# Patient Record
Sex: Female | Born: 1960 | Race: White | Hispanic: No | Marital: Married | Smoking: Never smoker
Health system: Southern US, Community
[De-identification: ages and names within clinical notes are randomized; demographics above are authoritative.]

## PROBLEM LIST (undated history)

## (undated) DIAGNOSIS — Z923 Personal history of irradiation: Secondary | ICD-10-CM

## (undated) DIAGNOSIS — Z803 Family history of malignant neoplasm of breast: Secondary | ICD-10-CM

## (undated) DIAGNOSIS — T8859XA Other complications of anesthesia, initial encounter: Secondary | ICD-10-CM

## (undated) DIAGNOSIS — J302 Other seasonal allergic rhinitis: Secondary | ICD-10-CM

## (undated) DIAGNOSIS — C50919 Malignant neoplasm of unspecified site of unspecified female breast: Secondary | ICD-10-CM

## (undated) DIAGNOSIS — F32A Depression, unspecified: Secondary | ICD-10-CM

## (undated) DIAGNOSIS — J45909 Unspecified asthma, uncomplicated: Secondary | ICD-10-CM

## (undated) DIAGNOSIS — Z8042 Family history of malignant neoplasm of prostate: Secondary | ICD-10-CM

## (undated) DIAGNOSIS — R112 Nausea with vomiting, unspecified: Secondary | ICD-10-CM

## (undated) DIAGNOSIS — F419 Anxiety disorder, unspecified: Secondary | ICD-10-CM

## (undated) DIAGNOSIS — T4145XA Adverse effect of unspecified anesthetic, initial encounter: Secondary | ICD-10-CM

## (undated) DIAGNOSIS — I10 Essential (primary) hypertension: Secondary | ICD-10-CM

## (undated) DIAGNOSIS — F329 Major depressive disorder, single episode, unspecified: Secondary | ICD-10-CM

## (undated) DIAGNOSIS — Z9889 Other specified postprocedural states: Secondary | ICD-10-CM

## (undated) DIAGNOSIS — C801 Malignant (primary) neoplasm, unspecified: Secondary | ICD-10-CM

## (undated) HISTORY — DX: Essential (primary) hypertension: I10

## (undated) HISTORY — DX: Unspecified asthma, uncomplicated: J45.909

## (undated) HISTORY — DX: Family history of malignant neoplasm of prostate: Z80.42

## (undated) HISTORY — DX: Family history of malignant neoplasm of breast: Z80.3

## (undated) HISTORY — DX: Malignant neoplasm of unspecified site of unspecified female breast: C50.919

---

## 1998-03-24 ENCOUNTER — Other Ambulatory Visit: Admission: RE | Admit: 1998-03-24 | Discharge: 1998-03-24 | Payer: Self-pay | Admitting: Obstetrics and Gynecology

## 1998-10-21 ENCOUNTER — Encounter: Payer: Self-pay | Admitting: Emergency Medicine

## 1998-10-21 ENCOUNTER — Emergency Department (HOSPITAL_COMMUNITY): Admission: EM | Admit: 1998-10-21 | Discharge: 1998-10-21 | Payer: Self-pay | Admitting: Emergency Medicine

## 1998-12-25 ENCOUNTER — Other Ambulatory Visit: Admission: RE | Admit: 1998-12-25 | Discharge: 1998-12-25 | Payer: Self-pay | Admitting: Obstetrics and Gynecology

## 1999-08-10 ENCOUNTER — Other Ambulatory Visit: Admission: RE | Admit: 1999-08-10 | Discharge: 1999-08-10 | Payer: Self-pay | Admitting: Obstetrics and Gynecology

## 2000-01-18 ENCOUNTER — Other Ambulatory Visit: Admission: RE | Admit: 2000-01-18 | Discharge: 2000-01-18 | Payer: Self-pay | Admitting: Obstetrics and Gynecology

## 2001-03-23 ENCOUNTER — Other Ambulatory Visit: Admission: RE | Admit: 2001-03-23 | Discharge: 2001-03-23 | Payer: Self-pay | Admitting: Obstetrics and Gynecology

## 2002-04-05 ENCOUNTER — Other Ambulatory Visit: Admission: RE | Admit: 2002-04-05 | Discharge: 2002-04-05 | Payer: Self-pay | Admitting: Obstetrics and Gynecology

## 2003-04-16 ENCOUNTER — Other Ambulatory Visit: Admission: RE | Admit: 2003-04-16 | Discharge: 2003-04-16 | Payer: Self-pay | Admitting: Obstetrics and Gynecology

## 2005-05-07 ENCOUNTER — Other Ambulatory Visit: Admission: RE | Admit: 2005-05-07 | Discharge: 2005-05-07 | Payer: Self-pay | Admitting: Obstetrics and Gynecology

## 2017-02-28 ENCOUNTER — Encounter: Payer: Self-pay | Admitting: Women's Health

## 2017-02-28 ENCOUNTER — Ambulatory Visit (INDEPENDENT_AMBULATORY_CARE_PROVIDER_SITE_OTHER): Payer: BC Managed Care – PPO | Admitting: Women's Health

## 2017-02-28 ENCOUNTER — Telehealth: Payer: Self-pay | Admitting: *Deleted

## 2017-02-28 VITALS — BP 118/80 | Ht 66.0 in | Wt 158.0 lb

## 2017-02-28 DIAGNOSIS — N6310 Unspecified lump in the right breast, unspecified quadrant: Secondary | ICD-10-CM | POA: Diagnosis not present

## 2017-02-28 NOTE — Patient Instructions (Signed)
Colonoscopy  Dr Carlean Purl   [phone]  Fibrocystic Breast Changes Fibrocystic breast changes are changes in breast tissue that can cause breasts to become swollen, lumpy, or painful. This can happen due to buildup of scar-like tissue (fibrous tissue) or the forming of fluid-filled lumps (cysts) in the breast. This is a common condition, and it is not cancerous (is benign). The exact cause is not known, but it seems to occur when women go through hormonal changes during their menstrual cycle. Fibrocystic breast changes can affect one or both breasts. What are the causes? The exact cause of fibrocystic breast changes is not known. However, this condition:  May be related to the female hormones estrogen and progesterone.  May be influenced by family traits that get passed from parent to child (genetics). What are the signs or symptoms? Symptoms of this condition may affect one or both breasts, and may include:  Tenderness, mild discomfort, or pain.  Swelling.  Rope-like tissue that can be felt when touching the breast.  Lumps in one or both breasts.  Changes in breast size. Breasts may get larger before the menstrual period and smaller after the menstrual period.  Green or dark brown discharge from the nipple. Symptoms are usually worse before menstrual periods start, and they get better toward the end of menstrual periods. How is this diagnosed? This condition is diagnosed based on your medical history and a physical exam of your breasts. You may also have tests, such as:  A breast X-ray (mammogram).  Ultrasound of your breasts.  MRI.  Removal of a breast tissue sample for testing (breast biopsy). This may be done if your health care provider thinks that something else may be causing changes in your breasts. How is this treated? Often, treatment is not needed for this condition. In some cases, treatment may include:  Taking over-the-counter pain relievers to help lessen pain or  discomfort.  Limiting or avoiding caffeine. Foods and beverages that contain caffeine include chocolate, soda, coffee, and tea.  Reducing sugar and fat in your diet. Your health care provider may also recommend:  A procedure to remove fluid from a cyst that is causing pain (fine needle aspiration).  Surgery to remove a cyst that is large or tender or does not go away. Follow these instructions at home:  Examine your breasts after every menstrual period. If you do not have menstrual periods, check your breasts on the first day of every month. Feel for changes in your breasts, such as:  More tenderness.  A new growth.  A change in size.  A change in an existing lump.  Take over-the-counter and prescription medicines only as told by your health care provider.  Wear a well-fitted support or sports bra, especially when exercising.  Decrease or avoid caffeine, fat, and sugar in your diet as directed by your health care provider. Contact a health care provider if:  You have fluid leaking from your nipple, especially if it is bloody.  You have new lumps or bumps in your breast.  Your breast becomes enlarged, red, and painful.  You have areas of your breast that pucker inward.  Your nipple appears flat or indented. Get help right away if:  You have redness of your breast and the redness is spreading. Summary  Fibrocystic breast changes are changes in breast tissue that can cause breasts to become swollen, lumpy, or painful.  This condition may be related to the female hormones estrogen and progesterone.  With this condition, it is  important to examine your breasts after every menstrual period. If you do not have menstrual periods, check your breasts on the first day of every month. This information is not intended to replace advice given to you by your health care provider. Make sure you discuss any questions you have with your health care provider. Document Released:  07/14/2006 Document Revised: 06/08/2016 Document Reviewed: 05/26/2016 Elsevier Interactive Patient Education  2017 [employer].

## 2017-02-28 NOTE — Telephone Encounter (Signed)
-----   Message from Huel Cote, NP sent at 02/28/2017  2:20 PM EDT ----- Needs diagnostic mammogram with [place] on left breast at 2    3cm mobile slightly tender mobile.   Last mammogram at 49. PGM died from breast cancer at 80.

## 2017-02-28 NOTE — Progress Notes (Signed)
Presents as a new patient with a problem. Reports 2 day history of a left breast slightly tender mobile firm questionable cyst in the outer aspect of breast. Denies injury, change in routine. Normal only mammogram at age 56 at [facility] radiology. Paternal grandmother died of breast cancer at 39. Postmenopausal on no HRT with no bleeding for greater than 5 years. Reports normal Pap history states last Pap was done several years ago at primary care. Hypertension, depression on Wellbutrin and doing well and  having a questionable skin autoimmune issue. Has lost about 30 pounds in the past year with dietary changes and questions a gluten intolerance. Second grade teacher.  Exam: Appears well slightly worried. Examined sitting and lying position left breast 3 cm mobile slightly tender smooth cystic area at 2:00 position. No nipple discharge, skin retractions, erythema, dimpling.  2 day history of a right breast mass at 2:00 position  Plan: Diagnostic mammogram with ultrasound. Reviewed the importance of an annual mammogram. Also reviewed other health screenings. Diagnostic mammogram with ultrasound scheduled for 03/01/2017 at 2:30 PM. at [facility]. Encouraged to schedule an annual exam.

## 2017-02-28 NOTE — Telephone Encounter (Signed)
-----   Message from Huel Cote, NP sent at 02/28/2017  2:20 PM EDT ----- Needs diagnostic mammogram with [place] on left breast at 2    3cm mobile slightly tender mobile.   Last mammogram at 65. PGM died from breast cancer at 19.

## 2017-02-28 NOTE — Telephone Encounter (Signed)
Error encounter already open. °

## 2017-02-28 NOTE — Telephone Encounter (Signed)
Appointment on 03/01/17 @ 2:30pm pt informed, order faxed.

## 2017-03-01 ENCOUNTER — Other Ambulatory Visit: Payer: Self-pay | Admitting: Radiology

## 2017-03-01 NOTE — Telephone Encounter (Signed)
Haley Sanchez called from [facility] and said 2 area's in patient left breast needed bx, they needed verbal order, I okayed order for left breast bx. They will fax order for you to sign as well.

## 2017-03-01 NOTE — Telephone Encounter (Signed)
thanks

## 2017-03-03 ENCOUNTER — Telehealth: Payer: Self-pay | Admitting: *Deleted

## 2017-03-03 ENCOUNTER — Other Ambulatory Visit: Payer: Self-pay | Admitting: *Deleted

## 2017-03-03 ENCOUNTER — Encounter: Payer: Self-pay | Admitting: *Deleted

## 2017-03-03 DIAGNOSIS — Z171 Estrogen receptor negative status [ER-]: Principal | ICD-10-CM

## 2017-03-03 DIAGNOSIS — Z170 Estrogen receptor positive status [ER+]: Secondary | ICD-10-CM | POA: Insufficient documentation

## 2017-03-03 DIAGNOSIS — C50512 Malignant neoplasm of lower-outer quadrant of left female breast: Secondary | ICD-10-CM | POA: Insufficient documentation

## 2017-03-03 NOTE — Telephone Encounter (Signed)
Left vm regarding [facility] for 5.30.18. Contact information provided. 

## 2017-03-04 ENCOUNTER — Encounter: Payer: Self-pay | Admitting: Women's Health

## 2017-03-09 ENCOUNTER — Other Ambulatory Visit: Payer: Self-pay

## 2017-03-09 ENCOUNTER — Ambulatory Visit: Payer: BC Managed Care – PPO | Attending: General Surgery | Admitting: Physical Therapy

## 2017-03-09 ENCOUNTER — Ambulatory Visit
Admission: RE | Admit: 2017-03-09 | Discharge: 2017-03-09 | Disposition: A | Discharge status: Home / Self Care | Payer: BC Managed Care – PPO | Source: Ambulatory Visit | Attending: Radiation Oncology | Admitting: Radiation Oncology

## 2017-03-09 ENCOUNTER — Ambulatory Visit: Payer: Self-pay | Admitting: Hematology and Oncology

## 2017-03-09 ENCOUNTER — Other Ambulatory Visit (HOSPITAL_BASED_OUTPATIENT_CLINIC_OR_DEPARTMENT_OTHER): Payer: BC Managed Care – PPO

## 2017-03-09 ENCOUNTER — Ambulatory Visit (HOSPITAL_BASED_OUTPATIENT_CLINIC_OR_DEPARTMENT_OTHER): Payer: BC Managed Care – PPO | Admitting: Oncology

## 2017-03-09 ENCOUNTER — Encounter: Payer: Self-pay | Admitting: Physical Therapy

## 2017-03-09 ENCOUNTER — Encounter: Payer: Self-pay | Admitting: Oncology

## 2017-03-09 VITALS — BP 159/99 | HR 82 | Temp 98.7°F | Resp 18 | Ht 66.0 in | Wt 160.2 lb

## 2017-03-09 DIAGNOSIS — C50412 Malignant neoplasm of upper-outer quadrant of left female breast: Secondary | ICD-10-CM

## 2017-03-09 DIAGNOSIS — Z170 Estrogen receptor positive status [ER+]: Secondary | ICD-10-CM | POA: Insufficient documentation

## 2017-03-09 DIAGNOSIS — C50512 Malignant neoplasm of lower-outer quadrant of left female breast: Secondary | ICD-10-CM

## 2017-03-09 DIAGNOSIS — Z808 Family history of malignant neoplasm of other organs or systems: Secondary | ICD-10-CM | POA: Insufficient documentation

## 2017-03-09 DIAGNOSIS — R293 Abnormal posture: Secondary | ICD-10-CM | POA: Diagnosis present

## 2017-03-09 DIAGNOSIS — Z171 Estrogen receptor negative status [ER-]: Principal | ICD-10-CM

## 2017-03-09 DIAGNOSIS — J45909 Unspecified asthma, uncomplicated: Secondary | ICD-10-CM | POA: Insufficient documentation

## 2017-03-09 DIAGNOSIS — Z803 Family history of malignant neoplasm of breast: Secondary | ICD-10-CM | POA: Insufficient documentation

## 2017-03-09 DIAGNOSIS — I10 Essential (primary) hypertension: Secondary | ICD-10-CM | POA: Insufficient documentation

## 2017-03-09 LAB — COMPREHENSIVE METABOLIC PANEL
ALT: 15 U/L (ref 0–55)
AST: 14 U/L (ref 5–34)
Albumin: 4.1 g/dL (ref 3.5–5.0)
Alkaline Phosphatase: 77 U/L (ref 40–150)
Anion Gap: 8 mEq/L (ref 3–11)
BUN: 16.9 mg/dL (ref 7.0–26.0)
CO2: 29 mEq/L (ref 22–29)
Calcium: 9.2 mg/dL (ref 8.4–10.4)
Chloride: 106 mEq/L (ref 98–109)
Creatinine: 1 mg/dL (ref 0.6–1.1)
EGFR: 64 mL/min/{1.73_m2} — ABNORMAL LOW (ref >90)
Glucose: 73 mg/dl (ref 70–140)
Potassium: 4 mEq/L (ref 3.5–5.1)
Sodium: 144 mEq/L (ref 136–145)
Total Bilirubin: 0.44 mg/dL (ref 0.20–1.20)
Total Protein: 6.7 g/dL (ref 6.4–8.3)

## 2017-03-09 LAB — CBC WITH DIFFERENTIAL/PLATELET
BASO%: 0.5 % (ref 0.0–2.0)
Basophils Absolute: 0 10*3/uL (ref 0.0–0.1)
EOS%: 2.9 % (ref 0.0–7.0)
Eosinophils Absolute: 0.1 10*3/uL (ref 0.0–0.5)
HCT: 40.6 % (ref 34.8–46.6)
HGB: 13.6 g/dL (ref 11.6–15.9)
LYMPH%: 31.1 % (ref 14.0–49.7)
MCH: 30.3 pg (ref 25.1–34.0)
MCHC: 33.5 g/dL (ref 31.5–36.0)
MCV: 90.4 fL (ref 79.5–101.0)
MONO#: 0.3 10*3/uL (ref 0.1–0.9)
MONO%: 8.4 % (ref 0.0–14.0)
NEUT#: 2.2 10*3/uL (ref 1.5–6.5)
NEUT%: 57.1 % (ref 38.4–76.8)
Platelets: 211 10*3/uL (ref 145–400)
RBC: 4.49 10*6/uL (ref 3.70–5.45)
RDW: 12.9 % (ref 11.2–14.5)
WBC: 3.8 10*3/uL — ABNORMAL LOW (ref 3.9–10.3)
lymph#: 1.2 10*3/uL (ref 0.9–3.3)

## 2017-03-09 NOTE — Progress Notes (Signed)
Radiation Oncology         [phone] ________________________________  Initial outpatient Consultation  Name: Haley Sanchez MRN: [id number]  Date: 03/09/2017  DOB: 1961/02/03  WT:Haley Sanchez, Haley Main, MD  Haley Seltzer, MD   REFERRING PHYSICIAN: Excell Seltzer, MD  DIAGNOSIS:    ICD-9-CM ICD-10-CM   1. Malignant neoplasm of lower-outer quadrant of left breast of female, estrogen receptor positive (Haley Sanchez) 174.5 C50.512    V86.0 Z17.0   Cancer Staging Malignant neoplasm of lower-outer quadrant of left breast of female, estrogen receptor positive (Haley Sanchez) Staging form: Breast, AJCC 8th Edition - Clinical stage from 03/09/2017: Stage IB (cT2, cN0, cM0, G3, ER: Positive, PR: Positive, HER2: Positive) - Unsigned Staging comments: Staged at breast conference on 5.30.18  cT2N0M0 STAGE IB Left  Breast LOQ Invasive Ductal Carcinoma, ER+ / PR+ / Her2+, Grade 3  CHIEF COMPLAINT: Here to discuss management of left breast cancer  HISTORY OF PRESENT ILLNESS::Haley Sanchez is a 56 y.o. female who presented with a 5 day history of a palpable left breast lump. The patient's last mammogram was 18 years ago. Diagnostic bilateral mammogram on 03/01/17 showed a 1.6 cm mass with calcifications in the 2:3--3:00 position 5 cm from the nipple. US of the left breast revealed a 2.1 cm mass in the 3:00 position 5 cm from the nipple and a 0.8 cm mass in the left breast 4:00 position 1 cm from the nipple. Three normal appearing lymph nodes were noted in the left axilla.  Biopsies were performed on 03/01/17. 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 Biopsy of the left breast 4:00 position 1 cm from the nipple revealed fibrocystic changes and no evidence of malignancy.  The patient presents today in multidisciplinary breast clinic to discuss treatment options for the management of her disease.  GYN HISTORY   Menarchal: 14 LMP: 2010 Contraceptive: No HRT: No GP: G3P3  PREVIOUS RADIATION THERAPY: No  PAST MEDICAL HISTORY:  has a past medical history of Asthma and Hypertension.    PAST SURGICAL HISTORY:No past surgical history on file.  FAMILY HISTORY: family history includes Brain cancer in her maternal grandmother; Breast cancer in her paternal grandmother; Diabetes in her father; Heart disease in her mother; Hypertension in her father and mother; Kidney failure in her father.  SOCIAL HISTORY:  reports that she has never smoked. She has never used smokeless tobacco. She reports that she does not drink alcohol or use drugs. She is a second [place].  ALLERGIES: Patient has no known allergies.  MEDICATIONS:  Current Outpatient Prescriptions  Medication Sig Dispense Refill  . buPROPion (WELLBUTRIN XL) 300 MG 24 hr tablet Take 300 mg by mouth daily.    . Lisdexamfetamine Dimesylate (VYVANSE PO) Take 100 mg by mouth daily as needed.    Marland Kitchen losartan (COZAAR) 100 MG tablet Take 1 tablet by mouth daily.  0  . PARoxetine (PAXIL) 40 MG tablet Take 40 mg by mouth every morning.     No current facility-administered medications for this encounter.     REVIEW OF SYSTEMS: A 10+ POINT REVIEW OF SYSTEMS WAS OBTAINED including neurology, dermatology, psychiatry, cardiac, respiratory, lymph, extremities, GI, GU, Musculoskeletal, constitutional, breasts, reproductive, HEENT. The patient reports weight changes, fatigue, wears glasses, ringing in the ears, palpitations, left breast lumps, skin rashes, anxiety, and depression. A complete review of systems is obtained and is otherwise  negative.   PHYSICAL EXAM:  Vitals with BMI 03/09/2017  Height 5' 6"   Weight 160 lbs 3 oz  BMI 27.2  Systolic 536  Diastolic 99  Pulse 82  Respirations 18  General: Alert and oriented, in no acute distress HEENT: Head is normocephalic. Extraocular movements are intact. Oropharynx is clear. Neck: Neck is supple, no  palpable cervical or supraclavicular lymphadenopathy. Heart: Regular in rate and rhythm with no murmurs, rubs, or gallops. Chest: Clear to auscultation bilaterally, with no rhonchi, wheezes, or rales. Abdomen: Soft, nontender, nondistended, with no rigidity or guarding. Extremities: No cyanosis or edema. Lymphatics: see Neck Exam Skin: No concerning lesions. Musculoskeletal: symmetric strength and muscle tone throughout. Neurologic: Cranial nerves II through XII are grossly intact. No obvious focalities. Speech is fluent. Coordination is intact. Psychiatric: Judgment and insight are intact. Affect is appropriate. Breasts: Palpable mass at the 4:00 position of the left breast measuring about 3 cm. No other palpable masses appreciated in the right breasts or axillae.  ECOG = 0  0 - Asymptomatic (Fully active, able to carry on all predisease activities without restriction)  1 - Symptomatic but completely ambulatory (Restricted in physically strenuous activity but ambulatory and able to carry out work of a light or sedentary nature. For example, light housework, office work)  2 - Symptomatic, <50% in bed during the day (Ambulatory and capable of all self care but unable to carry out any work activities. Up and about more than 50% of waking hours)  3 - Symptomatic, >50% in bed, but not bedbound (Capable of only limited self-care, confined to bed or chair 50% or more of waking hours)  4 - Bedbound (Completely disabled. Cannot carry on any self-care. Totally confined to bed or chair)  5 - Death   Eustace Pen MM, Creech RH, Tormey DC, et al. [phone]). "Toxicity and response criteria of the [facility]". [place] Oncol. 5 (6): 649-55   LABORATORY DATA:  Lab Results  Component Value Date   WBC 3.8 (L) 03/09/2017   HGB 13.6 03/09/2017   HCT 40.6 03/09/2017   MCV 90.4 03/09/2017   PLT 211 03/09/2017   CMP     Component Value Date/Time   NA 144 03/09/2017 1254   K 4.0  03/09/2017 1254   CO2 29 03/09/2017 1254   GLUCOSE 73 03/09/2017 1254   BUN 16.9 03/09/2017 1254   CREATININE 1.0 03/09/2017 1254   CALCIUM 9.2 03/09/2017 1254   PROT 6.7 03/09/2017 1254   ALBUMIN 4.1 03/09/2017 1254   AST 14 03/09/2017 1254   ALT 15 03/09/2017 1254   ALKPHOS 77 03/09/2017 1254   BILITOT 0.44 03/09/2017 1254      RADIOGRAPHY: as above    IMPRESSION/PLAN: Left breast cancer  It was a pleasure meeting the patient today. Due to the patient's triple positive disease, chemotherapy will be given neo-adjuvantly. We discussed the risks, benefits, and side effects of radiotherapy. I recommend radiotherapy to the left breast to reduce her risk of locoregional recurrence by 2/3.  I answered her questions about the safety of radiotherapy to he satisfaction. We discussed that radiation would take approximately 4-6 weeks to complete in a post-surgical setting.  We spoke about acute effects including skin irritation and fatigue as well as much less common late effects including internal organ injury or irritation. We spoke about the latest technology that is used to minimize the risk of late effects for patients undergoing radiotherapy to the breast or chest wall. No guarantees  of treatment were given. The patient is enthusiastic about proceeding with treatment. I look forward to participating in the patient's care.  I will await her referral back to me for postoperative follow-up and eventual CT simulation/treatment planning. Prior to chemotherapy, the patient will be scheduled for a bilateral breast MRI to view the extent of disease.  __________________________________________   Eppie Gibson, MD  This document serves as a record of services personally performed by Eppie Gibson, MD. It was created on her behalf by Darcus Austin, a trained medical scribe. The creation of this record is based on the scribe's personal observations and the provider's statements to them. This document has been  checked and approved by the attending provider.

## 2017-03-09 NOTE — Progress Notes (Signed)
Nutrition Assessment  Reason for Assessment:  Pt seen in Breast Clinic  ASSESSMENT:   56 year old female with new diagnosis of breast cancer.  Past medical history reviewed  Medications:  reviewed  Labs: reviewed  Anthropometrics:   Height: 66 inches Weight: 158 lb BMI: 25.6   NUTRITION DIAGNOSIS: Food and nutrition related knowledge deficit related to new diagnosis of breast cancer as evidenced by no prior need for nutrition related information.  INTERVENTION:   Discussed and provided packet of information regarding nutritional tips for breast cancer patients.  Questions answered.  Teachback method used.  Contact information provided and patient knows to contact me with questions/concerns.    MONITORING, EVALUATION, and GOAL: Pt will consume a healthy plant based diet to maintain lean body mass throughout treatment.   Haley Sanchez B. Zenia Resides, [place], [place] Registered Dietitian [phone] (pager)

## 2017-03-09 NOTE — Therapy (Signed)
[place], [place], [address] Phone: [phone]   Fax:  [phone]  Physical Therapy Evaluation  Patient Details  Name: Haley Sanchez MRN: [id number] Date of Birth: December 02, 1960 Referring Provider: Dr. Excell Seltzer  Encounter Date: 03/09/2017      PT End of Session - 03/09/17 1616    Visit Number 1   Number of Visits 1   PT Start Time 2376   PT Stop Time 1357   PT Time Calculation (min) 22 min   Activity Tolerance Patient tolerated treatment well   Behavior During Therapy [facility] for tasks assessed/performed      Past Medical History:  Diagnosis Date  . Asthma   . Hypertension     History reviewed. No pertinent surgical history.  There were no vitals filed for this visit.       Subjective Assessment - 03/09/17 1611    Subjective Patient reports she is here to be seen by her medical team for her newly diagnosed left breast cancer.   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 It measures 2.1 cm and is located in the lower outer quadrant. She has no other medical problems.   Patient Stated Goals Reduce lymphedema risk and learn post op shoulder ROM HEP   Currently in Pain? No/denies            [facility] PT Assessment - 03/09/17 0001      Assessment   Medical Diagnosis left breast cancer   Referring Provider Dr. Excell Seltzer   Onset Date/Surgical Date 03/01/17   Hand Dominance Right   Prior Therapy none     Precautions   Precautions Other (comment)   Precaution Comments active cancer     Restrictions   Weight Bearing Restrictions No     Balance Screen   Has the patient fallen in the past 6 months No   Has the patient had a decrease in activity level because of a fear of falling?  No   Is the patient reluctant to leave their home because of a fear  of falling?  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 [employer] She does not exercise     Cognition   Overall Cognitive Status Within Functional Limits for tasks assessed     Posture/Postural Control   Posture/Postural Control Postural limitations   Postural Limitations Forward head;Rounded Shoulders     ROM / Strength   AROM / PROM / Strength AROM;Strength     AROM   AROM Assessment Site Shoulder;Cervical   Right/Left Shoulder Right;Left   Right Shoulder Extension 62 Degrees   Right Shoulder Flexion 138 Degrees   Right Shoulder ABduction 163 Degrees   Right Shoulder Internal Rotation 80 Degrees   Right Shoulder External Rotation 87 Degrees   Left Shoulder Extension 57 Degrees   Left Shoulder Flexion 140 Degrees   Left Shoulder ABduction 162 Degrees   Left Shoulder Internal Rotation 79 Degrees   Left Shoulder External Rotation 90 Degrees   Cervical Flexion WNL   Cervical Extension WNL   Cervical - Right Side Bend  WNL   Cervical - Left Side Bend WNL   Cervical - Right Rotation WNL   Cervical - Left Rotation WNL     Strength   Overall Strength Within functional limits for tasks performed           LYMPHEDEMA/ONCOLOGY QUESTIONNAIRE - 03/09/17 1615      Type   Cancer Type Left breast     Lymphedema Assessments   Lymphedema Assessments Upper extremities     Right Upper Extremity Lymphedema   10 cm Proximal to Olecranon Process 28.2 cm   Olecranon Process 25.4 cm   10 cm Proximal to Ulnar Styloid Process 19.8 cm   Just Proximal to Ulnar Styloid Process 14.3 cm   Across Hand at [employer] 17.5 cm   At [place] of 2nd Digit 5.8 cm     Left Upper Extremity Lymphedema   10 cm Proximal to Olecranon  Process 29.2 cm   Olecranon Process 24.4 cm   10 cm Proximal to Ulnar Styloid Process 20.2 cm   Just Proximal to Ulnar Styloid Process 15.2 cm   Across Hand at [employer] 18.1 cm   At [place] of 2nd Digit 5.9 cm         Objective measurements completed on examination: See above findings.        Patient was instructed today in a home exercise program today for post op shoulder range of motion. These included active assist shoulder flexion in sitting, scapular retraction, wall walking with shoulder abduction, and hands behind head external rotation.  She was encouraged to do these twice a day, holding 3 seconds and repeating 5 times when permitted by her physician.              PT Education - 03/09/17 1616    Education provided Yes   Education Details Lymphedema risk reduction and post op shoulder ROM HEP   Person(s) Educated Patient;Child(ren)   Methods Explanation;Demonstration;Handout   Comprehension Returned demonstration;Verbalized understanding              Breast Clinic Goals - 03/09/17 1621      Patient will be able to verbalize understanding of pertinent lymphedema risk reduction practices relevant to her diagnosis specifically related to skin care.   Time 1   Period Days   Status Achieved     Patient will be able to return demonstrate and/or verbalize understanding of the post-op home exercise program related to regaining shoulder range of motion.   Time 1   Period Days   Status Achieved     Patient will be able to verbalize understanding of the importance of attending the postoperative After Breast Cancer Class for further lymphedema risk reduction education and therapeutic exercise.   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 It measures 2.1 cm and is located in  the lower outer quadrant. She has no other medical problems. Her multidisciplinary medical team met prior to her assessments to determine a recommended treatment plan. She is planning to have an MRI, neoadjuvant chemotherapy, a left lumpectomy and sentinel node biopsy, and radiation followed by anti-estrogen therapy. This is what was recommended as a plan. She reports being unsure of what she is willing / going to undergo. She is  currently in school for functional medicine and reports being unsure if she is going to undergo conventional medicine for her cancer. If she undergo breast surgery as recommended, she will benefit from post op PT to regain shoulder ROM and reduce lymphedema risk.   History and Personal Factors relevant to plan of care: none   Clinical Presentation Stable   Clinical Presentation due to: Stable condition at this time   Clinical Decision Making Low   Rehab Potential Good   Clinical Impairments Affecting Rehab Potential none   PT Frequency One time visit   PT Treatment/Interventions Patient/family education;Therapeutic exercise   PT Next Visit Plan Will f/u after surgery (If she chooses to undergo surgery) to determine PT needs   PT Home Exercise Plan Post op shoulder ROM HEP   Consulted and Agree with Plan of Care Patient;Family member/caregiver   Family Member Consulted friend and 2 adult sons      Patient will benefit from skilled therapeutic intervention in order to improve the following deficits and impairments:  Postural dysfunction, Decreased knowledge of precautions, Impaired UE functional use, Decreased range of motion  Visit Diagnosis: Carcinoma of lower-outer quadrant of left breast in female, estrogen receptor positive ([place]) - Plan: PT plan of care cert/re-cert  Abnormal posture - Plan: PT plan of care cert/re-cert   Patient will follow up at outpatient cancer rehab if needed following surgery.  If the patient requires physical therapy at that time, a specific  plan will be dictated and sent to the referring physician for approval. The patient was educated today on appropriate basic range of motion exercises to begin post operatively and the importance of attending the After Breast Cancer class following surgery.  Patient was educated today on lymphedema risk reduction practices as it pertains to recommendations that will benefit the patient immediately following surgery.  She verbalized good understanding.  No additional physical therapy is indicated at this time.      Problem List Patient Active Problem List   Diagnosis Date Noted  . Malignant neoplasm of lower-outer quadrant of left breast of female, estrogen receptor positive ([place]) 03/03/2017   Annia Friendly, PT 03/09/17 4:23 PM  [facility], [place], [address] Phone: [phone]   Fax:  [phone]  Name: Haley Sanchez MRN: [id number] Date of Birth: [birth year]

## 2017-03-09 NOTE — Progress Notes (Signed)
   [place] Psychosocial Distress Screening Spiritual Care  Counselor met with patient and family members in [facility] to introduce [facility] team/resources, reviewing distress screen per protocol.  The patient scored a 9 on the Psychosocial Distress Thermometer which indicates Severe distress. Also assessed for distress and other psychosocial needs.   Counselor reviewed distress screening with patient; patient stated that the [facility] Clinic has neither improved nor worsened her distress. Due to time constraints, counselor was unable to explore the distress screening in depth, but described [facility]'s resources and asked pt's permission to call her next week to check in on how she's doing emotionally; pt agreed.  Follow up needed: Yes, counselor will call patient next week.   ONCBCN DISTRESS SCREENING 03/09/2017  Screening Type Initial Screening  Distress experienced in past week (1-10) 9  Practical problem type Work/school  Family Problem type Partner  Emotional problem type Depression;Nervousness/Anxiety  Information Concerns Type Lack of info about diagnosis;Lack of info about treatment;Lack of info about complementary therapy choices  Physical Problem type Skin dry/itchy  Referral to support programs Yes    [place], [place] [facility] [place]

## 2017-03-09 NOTE — Patient Instructions (Signed)

## 2017-03-09 NOTE — Progress Notes (Signed)
DISCONTINUE ON PATHWAY REGIMEN - Breast   Docetaxel  + Carboplatin + Trastuzumab + Pertuzumab (TCHP) q21 Days:   A cycle is every 21 days:     Pertuzumab      Pertuzumab      Trastuzumab      Trastuzumab      Carboplatin      Docetaxel   **Always confirm dose/schedule in your pharmacy ordering system**    Trastuzumab (Maintenance - NO Loading Dose):   A cycle is every 21 days:     Trastuzumab   **Always confirm dose/schedule in your pharmacy ordering system**    REASON: Other Reason PRIOR TREATMENT: [id number]: Docetaxel + Carboplatin + Trastuzumab + Pertuzumab (TCHP) q21 Days x 6 Cycles, then Continue Trastuzumab Maintenance Post-Surgery q21 Days x 11 Cycles to Complete One Year of Therapy TREATMENT RESPONSE: Unable to Evaluate  START ON PATHWAY REGIMEN - Breast   Docetaxel  + Carboplatin + Trastuzumab + Pertuzumab (TCHP) q21 Days:   A cycle is every 21 days:     Pertuzumab      Pertuzumab      Trastuzumab      Trastuzumab      Carboplatin      Docetaxel   **Always confirm dose/schedule in your pharmacy ordering system**    Trastuzumab (Maintenance - NO Loading Dose):   A cycle is every 21 days:     Trastuzumab   **Always confirm dose/schedule in your pharmacy ordering system**    Patient Characteristics: Preoperative or Nonsurgical Candidate (Clinical Staging), Neoadjuvant Therapy followed by Surgery, Invasive Disease, Chemotherapy, HER2 Positive, ER Positive Therapeutic Status: Preoperative or Nonsurgical Candidate (Clinical Staging) AJCC M Category: cM0 AJCC Grade: G3 Breast Surgical Plan: Neoadjuvant Therapy followed by Surgery ER Status: Positive (+) AJCC 8 Stage Grouping: IB HER2 Status: Positive (+) AJCC T Category: cT2 AJCC N Category: cN0 PR Status: Positive (+)  Intent of Therapy: Curative Intent, Discussed with Patient

## 2017-03-09 NOTE — Progress Notes (Signed)
START ON PATHWAY REGIMEN - Breast   Docetaxel  + Carboplatin + Trastuzumab + Pertuzumab (TCHP) q21 Days:   A cycle is every 21 days:     Pertuzumab      Pertuzumab      Trastuzumab      Trastuzumab      Carboplatin      Docetaxel   **Always confirm dose/schedule in your pharmacy ordering system**    Trastuzumab (Maintenance - NO Loading Dose):   A cycle is every 21 days:     Trastuzumab   **Always confirm dose/schedule in your pharmacy ordering system**    Patient Characteristics: Preoperative or Nonsurgical Candidate (Clinical Staging), Neoadjuvant Therapy followed by Surgery, Invasive Disease, Chemotherapy, HER2 Positive, ER Positive Therapeutic Status: Preoperative or Nonsurgical Candidate (Clinical Staging) AJCC M Category: cM0 AJCC Grade: G3 Breast Surgical Plan: Neoadjuvant Therapy followed by Surgery ER Status: Positive (+) AJCC 8 Stage Grouping: IB HER2 Status: Positive (+) AJCC T Category: cT2 AJCC N Category: cN0 PR Status: Positive (+)  Intent of Therapy: Curative Intent, Discussed with Patient

## 2017-03-09 NOTE — Progress Notes (Signed)
[facility]  [phone] [phone]     ID: Haley Sanchez DOB: Feb 17, 1961  MR#: [id number]  TZG#:[id number]  Patient Care Team: Orpah Melter, MD as PCP - General (Family Medicine) Excell Seltzer, MD as Consulting Physician (General Surgery) Magrinat, Virgie Dad, MD as Consulting Physician (Oncology) Eppie Gibson, MD as Attending Physician (Radiation Oncology) Chauncey Cruel, MD OTHER MD:  CHIEF COMPLAINT: Triple positive breast cancer  CURRENT TREATMENT: Neoadjuvant chemotherapy immunotherapy   BREAST CANCER HISTORY: Haley Sanchez herself noted a lump in her left breast sometime before bringing it to medical attention. Note that the patient had not had a mammogram in 18 years.  On 03/01/2017 she was set up for bilateral diagnostic mammography with tomography and left breast ultrasonography by her gynecologist, Elon Alas. The breast density was category B. In the left breast at the 2:30 o'clock axis 5 cm from the nipple there was a 1.6 cm high density mass with spiculated margins and margins by ultrasound there was a 2.1 cm irregular solid mass at this location. There was also a 0.8 cm oval mass in the left breast at 4:00 1 cm from the nipple. There were 3 normal-appearing lymph nodes in the left axilla.  On 03/01/2017 the patient underwent biopsy of the 2 areas in question. The smaller lesion at 4:00 showed only fibrocystic changes with no evidence of malignancy. The lesion at 2:30 o'clock however showed invasive ductal carcinoma, grade 3, estrogen receptor 100% positive, progesterone receptor 80% positive, both with strong staining intensity, with an MIB-1 of 25%, and HER-2 positivity, the signals ratio being 1.88 but the number per cell being 6.20.  Her subsequent history is as detailed below  INTERVAL HISTORY: [name] was evaluated in the multidisciplinary breast cancer clinic 03/09/2017 accompanied by her sons Haley Sanchez and Haley Sanchez and by her friend  Haley Sanchez. Her case was also presented in the multidisciplinary breast cancer conference that same morning. At that time a preliminary plan was proposed: Breast MRI, neoadjuvant treatment followed by breast conserving surgery radiation and anti-estrogens  REVIEW OF SYSTEMS:  Aside from the mass itself, there were no specific symptoms leading to the original mammogram, which was routinely scheduled. The patient denies unusual headaches, visual changes, nausea, vomiting, stiff neck, dizziness, or gait imbalance. There has been no cough, phlegm production, or pleurisy, no chest pain or pressure, and no change in bowel or bladder habits. The patient denies fever, rash, bleeding, unexplained fatigue or unexplained weight loss. The patient does admit to anxiety and depression, a skin rash, and a history of palpitations. A detailed review of systems was otherwise entirely negative.   PAST MEDICAL HISTORY: Past Medical History:  Diagnosis Date  . Asthma   . Hypertension     PAST SURGICAL HISTORY: History reviewed. No pertinent surgical history.  FAMILY HISTORY Family History  Problem Relation Age of Onset  . Heart disease Mother   . Hypertension Mother   . Kidney failure Father   . Diabetes Father   . Hypertension Father   . Brain cancer Maternal Grandmother   . Breast cancer Paternal Grandmother   The patient's father died from renal failure at the age of 18. The patient tells me he refused hemodialysis and was treated with diet changes and lives 3 years. The patient's mother is living at age 50, as of May 2018. The patient has one brother, 2 sisters. A paternal grandmother was diagnosed with breast cancer at age 35. A maternal grandmother was diagnosed with brain cancer at some  point. There is no history of ovarian cancer in the family  GYNECOLOGIC HISTORY:  No LMP recorded. Patient is not currently having periods (Reason: Other). Menarche age 37, first live birth age 44. The patient is GX P3. She  stopped having periods approximately 2010 and did not take hormone replacement. 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 Her husband Darrel Danelle Earthly works for before [employer] as a Government social research officer. Son Haley Sanchez, 36, lives in [place] and works for [employer]. Son Haley Sanchez, [place], lives in [place] and works for a needle. Son Haley Sanchez 50 lives in [place] where he teaches at [employer] ([place] studies). The patient has 5 grandchildren. She is a Tourist information centre manager    ADVANCED DIRECTIVES: Not in place   HEALTH MAINTENANCE: Social History  Substance Use Topics  . Smoking status: Never Smoker  . Smokeless tobacco: Never Used  . Alcohol use No     Colonoscopy:[facility], more than 10 years before 2018  PAP:  Bone density: Never   No Known Allergies  Current Outpatient Prescriptions  Medication Sig Dispense Refill  . buPROPion (WELLBUTRIN XL) 300 MG 24 hr tablet Take 300 mg by mouth daily.    . Lisdexamfetamine Dimesylate (VYVANSE PO) Take 100 mg by mouth daily as needed.    Marland Kitchen losartan (COZAAR) 100 MG tablet Take 1 tablet by mouth daily.  0  . PARoxetine (PAXIL) 40 MG tablet Take 40 mg by mouth every morning.     No current facility-administered medications for this visit.     OBJECTIVE: Middle-aged white woman in no acute distress Vitals:   03/09/17 1327  BP: (!) 159/99  Pulse: 82  Resp: 18  Temp: 98.7 F (37.1 C)     Body mass index is 25.86 kg/m.    ECOG FS:0 - Asymptomatic  Ocular: Sclerae unicteric, pupils equal, round and reactive to light Ear-nose-throat: Oropharynx clear and moist Lymphatic: No cervical or supraclavicular adenopathy Lungs no rales or rhonchi, good excursion bilaterally Heart regular rate and rhythm, no murmur appreciated Abd soft, nontender, positive bowel sounds MSK no focal spinal tenderness, no joint  edema Neuro: non-focal, well-oriented, appropriate affect Breasts: The right breast is benign. In the left breast laterally there is a movable mass that measures a little under an inch, not associated with erythema or other skin changes or with nipple retraction. Both axillae are benign.   LAB RESULTS:  CMP     Component Value Date/Time   NA 144 03/09/2017 1254   K 4.0 03/09/2017 1254   CO2 29 03/09/2017 1254   GLUCOSE 73 03/09/2017 1254   BUN 16.9 03/09/2017 1254   CREATININE 1.0 03/09/2017 1254   CALCIUM 9.2 03/09/2017 1254   PROT 6.7 03/09/2017 1254   ALBUMIN 4.1 03/09/2017 1254   AST 14 03/09/2017 1254   ALT 15 03/09/2017 1254   ALKPHOS 77 03/09/2017 1254   BILITOT 0.44 03/09/2017 1254    No results found for: TOTALPROTELP, ALBUMINELP, A1GS, A2GS, BETS, BETA2SER, GAMS, MSPIKE, SPEI  No results found for: KPAFRELGTCHN, LAMBDASER, KAPLAMBRATIO  Lab Results  Component Value Date   WBC 3.8 (L) 03/09/2017   NEUTROABS 2.2 03/09/2017   HGB 13.6 03/09/2017   HCT 40.6 03/09/2017   MCV 90.4 03/09/2017   PLT 211 03/09/2017      Chemistry      Component Value Date/Time   NA 144 03/09/2017 1254   K  4.0 03/09/2017 1254   CO2 29 03/09/2017 1254   BUN 16.9 03/09/2017 1254   CREATININE 1.0 03/09/2017 1254      Component Value Date/Time   CALCIUM 9.2 03/09/2017 1254   ALKPHOS 77 03/09/2017 1254   AST 14 03/09/2017 1254   ALT 15 03/09/2017 1254   BILITOT 0.44 03/09/2017 1254       No results found for: LABCA2  No components found for: QBVQXI503  No results for input(s): INR in the last 168 hours.  Urinalysis No results found for: COLORURINE, APPEARANCEUR, LABSPEC, PHURINE, GLUCOSEU, HGBUR, BILIRUBINUR, KETONESUR, PROTEINUR, UROBILINOGEN, NITRITE, LEUKOCYTESUR   STUDIES: Outside studies reviewed with patient  ELIGIBLE FOR AVAILABLE RESEARCH PROTOCOL: no  ASSESSMENT: 56 y.o.  [place] woman status post left breast lower outer quadrant biopsy 03/01/2017 for  a clinical T2 N0, stage IB invasive ductal carcinoma, grade 3, triple positive, with an MIB-1 of 25%  (1) neoadjuvant chemotherapy to consist of paclitaxel weekly 12 together with trastuzumab every 21 days  (2) trastuzumab to be continued to complete one year  (3) definitive surgery to follow chemotherapy  (4) adjuvant radiation to follow surgery  (5) anti-estrogens to follow the completion of local treatment   PLAN: We spent the better part of today's hour-long appointment discussing the biology of breast cancer in general, and the specifics of the patient's tumor in particular.We first reviewed the fact that cancer is not one disease but more than 100 different diseases and that it is important to keep them separate-- otherwise when friends and relatives discuss their own cancer experiences with [place] confusion can result. Similarly we explained that if breast cancer spreads to the bone or liver, the patient would not have bone cancer or liver cancer, but breast cancer in the bone and breast cancer in the liver: one cancer in three places-- not 3 different cancers which otherwise would have to be treated in 3 different ways.  We discussed the difference between local and systemic therapy. In terms of loco-regional treatment, lumpectomy plus radiation is equivalent to mastectomy as far as survival is concerned. For this reason, and because the cosmetic results are generally superior, we generally recommend breast conserving surgery. We also noted that in terms of sequencing of treatments, whether systemic therapy or surgery is done first does not affect the ultimate outcome.  We then discussed the rationale for systemic therapy. There is some risk that this cancer may have already spread to other parts of her body. Patients frequently ask at this point about bone scans, CAT scans and PET scans to find out if they have occult breast cancer somewhere else. The problem is that in early stage disease  we are much more likely to find false positives then true cancers and this would expose the patient to unnecessary procedures as well as unnecessary radiation. Scans cannot answer the question the patient really would like to know, which is whether she has microscopic disease elsewhere in her body. For those reasons we do not recommend them.  Of course we would proceed to aggressive evaluation of any symptoms that might suggest metastatic disease, but that is not the case here.  Next we went over the options for systemic therapy which are anti-estrogens, anti-HER-2 immunotherapy, and chemotherapy. Teshara is a good candidate for all 3 and we explained that if her risk of recurrence after optimal local treatment is X, chemotherapy will lower that by one third, to two thirds of X, anti-estrogen therapy will cut it in half  to one third of X and anti-HER-2 treatment will cut in half again to 1/6 of X. This means that her risk of systemic recurrence after optimal local treatment is 42% for example it would decrease to 7% with standard treatment, which is what we propose  More specifically this would consist of paclitaxel weekly 12 given together with trastuzumab every 3 weeks. The trastuzumab would be continued to complete one year. This would be followed by surgery and radiation, then anti-estrogens for 5 years.  We discussed the possible toxicities, side effects and complications of the various agents. Cosima understands she will need a port and an echocardiogram and also will benefit from come to chemotherapy school. She will need a baseline MRI as well.  Desirae has a good understanding of the overall plan. She expressed some hesitation regarding it in terms of timing (there is a grandchild on the way). She knows the goal of treatment in her case is cure. She will call with any problems that may develop before her next visit here.  Chauncey Cruel, MD   03/09/2017 9:55 PM Medical Oncology and  Hematology [facility] [address] [place], [place] [address] Tel. [phone]    Fax. [phone]

## 2017-03-10 ENCOUNTER — Encounter: Payer: Self-pay | Admitting: *Deleted

## 2017-03-10 ENCOUNTER — Telehealth: Payer: Self-pay | Admitting: *Deleted

## 2017-03-10 NOTE — Telephone Encounter (Signed)
Spoke to Haley Sanchez regarding [place] from 5.30.18. Denies questions or concerns regarding dx. Haley Sanchez has decided to move forward with treatment plan. Haley Sanchez request to see Dr. Lindi Adie and Dr. Donne Hazel. Haley Sanchez scheduled to see Dr. Lindi Adie on 6/4 at 3:45. Informed Haley Sanchez that we will work with Dr. Cristal Generous office to get an appt to see her. Received verbal understanding. Encourage Haley Sanchez to call with further questions or needs. Contact information provided.

## 2017-03-12 ENCOUNTER — Ambulatory Visit (HOSPITAL_COMMUNITY)
Admission: RE | Admit: 2017-03-12 | Discharge: 2017-03-12 | Disposition: A | Discharge status: Home / Self Care | Payer: BC Managed Care – PPO | Source: Ambulatory Visit | Attending: Oncology | Admitting: Oncology

## 2017-03-12 DIAGNOSIS — C50512 Malignant neoplasm of lower-outer quadrant of left female breast: Secondary | ICD-10-CM | POA: Diagnosis not present

## 2017-03-12 DIAGNOSIS — Z170 Estrogen receptor positive status [ER+]: Secondary | ICD-10-CM | POA: Diagnosis not present

## 2017-03-12 MED ORDER — GADOBENATE DIMEGLUMINE 529 MG/ML IV SOLN
15.0000 mL | Freq: Once | INTRAVENOUS | Status: AC | PRN
  Administered 2017-03-12: 15 mL via INTRAVENOUS

## 2017-03-14 ENCOUNTER — Ambulatory Visit (HOSPITAL_BASED_OUTPATIENT_CLINIC_OR_DEPARTMENT_OTHER): Payer: BC Managed Care – PPO | Admitting: Hematology and Oncology

## 2017-03-14 ENCOUNTER — Encounter: Payer: Self-pay | Admitting: Hematology and Oncology

## 2017-03-14 ENCOUNTER — Encounter: Payer: Self-pay | Admitting: *Deleted

## 2017-03-14 VITALS — BP 151/89 | HR 83 | Temp 98.4°F | Resp 20 | Ht 66.0 in | Wt 156.6 lb

## 2017-03-14 DIAGNOSIS — C50512 Malignant neoplasm of lower-outer quadrant of left female breast: Secondary | ICD-10-CM

## 2017-03-14 DIAGNOSIS — Z170 Estrogen receptor positive status [ER+]: Secondary | ICD-10-CM

## 2017-03-14 MED ORDER — LIDOCAINE-PRILOCAINE 2.5-2.5 % EX CREA: TOPICAL_CREAM | CUTANEOUS | 3 refills | Status: DC

## 2017-03-14 MED ORDER — ONDANSETRON HCL 8 MG PO TABS: 8.0000 mg | ORAL_TABLET | Freq: Two times a day (BID) | ORAL | 1 refills | Status: DC | PRN

## 2017-03-14 MED ORDER — PROCHLORPERAZINE MALEATE 10 MG PO TABS: 10.0000 mg | ORAL_TABLET | Freq: Four times a day (QID) | ORAL | 1 refills | Status: DC | PRN

## 2017-03-14 NOTE — Progress Notes (Signed)
Patient came in to meet me responding to my voicemail. Introduced myself as her Arboriculturist. Asked patient if she has met her OOP for the year and she states she does not know what it is but is sure she has not. Advised patient of the copay assistance program through [place] for Herceptin and asked if she would like to apply. Patient states yes. Completed the enrollment online. Patient approved for $25,000 03/14/17-03/13/18 only leaving her with a $5 copay for Herceptin. Advised patient I will receive an approval letter via fax and email to billing and that she will receive a copy in the mail. There is nothing she needs to do with her copy a it is only for her records.  Asked patient about household size and income. Per verbal, patient is approved for the one-time $1000 [employer]. Patient has a copy of the approval letter as well as the expense sheet and our outpatient pharmacy information. Explained to patient how grant works and what it covers. Patient has my card for any additional financial questions or concerns.   Emailed copy of approval letter with copay card information to [facility] billing team. Copy placed in my copay book.

## 2017-03-14 NOTE — Progress Notes (Signed)
Called patient to introduce myself and to offer [place] and [employer]. Left voicemail and placed a note in for patient to see me this afternoon to discuss after her visit with doctor.

## 2017-03-14 NOTE — Progress Notes (Signed)
Patient Care Team: Orpah Melter, MD as PCP - General (Family Medicine) Excell Seltzer, MD as Consulting Physician (General Surgery) Magrinat, Virgie Dad, MD as Consulting Physician (Oncology) Eppie Gibson, MD as Attending Physician (Radiation Oncology) Huel Cote, NP as Nurse Practitioner (Obstetrics and Gynecology)  DIAGNOSIS:  Encounter Diagnosis  Name Primary?  . Malignant neoplasm of lower-outer quadrant of left breast of female, estrogen receptor positive ([place])     SUMMARY OF ONCOLOGIC HISTORY:   Malignant neoplasm of lower-outer quadrant of left breast of female, estrogen receptor positive ([place])   03/01/2017 Initial Diagnosis    Left breast 2:30 position: 2.1 cm irregular solid mass, additional 0.8 cm oval mass in the left breast (fibrocystic); primary mass on biopsy revealed IDC grade 3, ER 100%, PR 80%, Ki-67 25%, HER-2 positive ratio 1.88, copy #6.2, T2 N0 stage II a clinical stage       CHIEF COMPLIANT: Patient wishes to transfer care to me  INTERVAL HISTORY: Haley Sanchez is a 56 year old with above-mentioned history of left breast cancer who after much trepidation had undergone biopsy for the left breast lump which came back as 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 She was presented at the multidisciplinary tumor board and the recommendation was to neoadjuvant chemotherapy followed by lumpectomy followed by radiation and antiestrogen therapy. She was presented this treatment plan and she appears to have [facility] board with the plan. However she tells me that many of her friends were treated by me and would like to switch providers at this time before she starts her treatment. She absolutely had no issues and felt very comfortable with Dr. Jana Hakim as well. She is accompanied today by her friend.  REVIEW OF SYSTEMS:   Constitutional: Denies fevers, chills or abnormal weight loss Eyes: Denies blurriness of vision Ears, nose, mouth, throat, and  face: Denies mucositis or sore throat Respiratory: Denies cough, dyspnea or wheezes Cardiovascular: Denies palpitation, chest discomfort Gastrointestinal:  Denies nausea, heartburn or change in bowel habits Skin: Denies abnormal skin rashes Lymphatics: Denies new lymphadenopathy or easy bruising Neurological:Denies numbness, tingling or new weaknesses Behavioral/Psych: Very anxious and nervous  Extremities: No lower extremity edema Breast:  denies any pain or lumps or nodules in either breasts All other systems were reviewed with the patient and are negative.  I have reviewed the past medical history, past surgical history, social history and family history with the patient and they are unchanged from previous note.  ALLERGIES:  has No Known Allergies.  MEDICATIONS:  Current Outpatient Prescriptions  Medication Sig Dispense Refill  . buPROPion (WELLBUTRIN XL) 300 MG 24 hr tablet Take 300 mg by mouth daily.    . Lisdexamfetamine Dimesylate (VYVANSE PO) Take 100 mg by mouth daily as needed.    Marland Kitchen losartan (COZAAR) 100 MG tablet Take 1 tablet by mouth daily.  0  . PARoxetine (PAXIL) 40 MG tablet Take 40 mg by mouth every morning.     No current facility-administered medications for this visit.     PHYSICAL EXAMINATION: ECOG PERFORMANCE STATUS: 1 - Symptomatic but completely ambulatory  Vitals:   03/14/17 1616  BP: (!) 151/89  Pulse: 83  Resp: 20  Temp: 98.4 F (36.9 C)   Filed Weights   03/14/17 1616  Weight: 156 lb 9.6 oz (71 kg)    GENERAL:alert, no distress and comfortable SKIN: skin color, texture, turgor are normal, no rashes or significant lesions EYES: normal, Conjunctiva are pink and non-injected, sclera clear OROPHARYNX:no exudate, no  erythema and lips, buccal mucosa, and tongue normal  NECK: supple, thyroid normal size, non-tender, without nodularity LYMPH:  no palpable lymphadenopathy in the cervical, axillary or inguinal LUNGS: clear to auscultation and  percussion with normal breathing effort HEART: regular rate & rhythm and no murmurs and no lower extremity edema ABDOMEN:abdomen soft, non-tender and normal bowel sounds MUSCULOSKELETAL:no cyanosis of digits and no clubbing  NEURO: alert & oriented x 3 with fluent speech, no focal motor/sensory deficits EXTREMITIES: No lower extremity edema  LABORATORY DATA:  I have reviewed the data as listed   Chemistry      Component Value Date/Time   NA 144 03/09/2017 1254   K 4.0 03/09/2017 1254   CO2 29 03/09/2017 1254   BUN 16.9 03/09/2017 1254   CREATININE 1.0 03/09/2017 1254      Component Value Date/Time   CALCIUM 9.2 03/09/2017 1254   ALKPHOS 77 03/09/2017 1254   AST 14 03/09/2017 1254   ALT 15 03/09/2017 1254   BILITOT 0.44 03/09/2017 1254       Lab Results  Component Value Date   WBC 3.8 (L) 03/09/2017   HGB 13.6 03/09/2017   HCT 40.6 03/09/2017   MCV 90.4 03/09/2017   PLT 211 03/09/2017   NEUTROABS 2.2 03/09/2017    ASSESSMENT & PLAN:  Malignant neoplasm of lower-outer quadrant of left breast of female, estrogen receptor positive ([facility]) Left breast 2:30 position: 2.1 cm irregular solid mass, additional 0.8 cm oval mass in the left breast (fibrocystic); primary mass on biopsy revealed IDC grade 3, ER 100%, PR 80%, Ki-67 25%, HER-2 positive ratio 1.88, copy #6.2, T2 N0 stage II a clinical stage  Pathology and radiology counseling: Discussed with the patient, the details of pathology including the type of breast cancer,the clinical staging, the significance of ER, PR and HER-2/neu receptors and the implications for treatment. After reviewing the pathology in detail, we proceeded to discuss the different treatment options between surgery, radiation, chemotherapy, antiestrogen therapies.  Recommendation: 1. Neoadjuvant Taxol Herceptin weekly 12 followed by Herceptin maintenance for 6 months 2. breast conserving surgery with sentinel lymph node biopsy 3. Adjuvant radiation  therapy 4. Followed by adjuvant antiestrogen therapy  Chemotherapy counseling: I discussed this and benefits of Taxol chemotherapy including the risk of neuropathy, cytopenias, risk of infection, fatigue, anemia, loss of taste, hair loss, nausea, liver function abnormalities etc. I also explained the reversible cardiomyopathy risk with Herceptin. Patient understands the risks and benefits and consented to proceeding with treatment.  Plan: 1. Echocardiogram 2. Chemotherapy class 3. Port placement    I spent 60 minutes talking to the patient of which more than half was spent in counseling and coordination of care.  No orders of the defined types were placed in this encounter.  The patient has a good understanding of the overall plan. she agrees with it. she will call with any problems that may develop before the next visit here.   Rulon Eisenmenger, MD 03/14/17

## 2017-03-14 NOTE — Assessment & Plan Note (Signed)
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 After reviewing the pathology in detail, we proceeded to discuss the different treatment options between surgery, radiation, chemotherapy, antiestrogen therapies.  Recommendation: 1. Neoadjuvant Taxol Herceptin weekly 12 followed by Herceptin maintenance for 6 months 2. breast conserving surgery with sentinel lymph node biopsy 3. Adjuvant radiation therapy 4. Followed by adjuvant antiestrogen therapy  Chemotherapy counseling: I discussed this and benefits of Taxol chemotherapy including the risk of neuropathy, cytopenias, risk of infection, fatigue, anemia, loss of taste, hair loss, nausea, liver function abnormalities etc. I also explained the reversible cardiomyopathy risk with Herceptin. Patient understands the risks and benefits and consented to proceeding with treatment.  Plan: 1. Echocardiogram 2. Chemotherapy class 3. Port placement

## 2017-03-15 ENCOUNTER — Encounter: Payer: Self-pay | Admitting: *Deleted

## 2017-03-15 ENCOUNTER — Other Ambulatory Visit: Payer: Self-pay | Admitting: General Surgery

## 2017-03-15 ENCOUNTER — Encounter (HOSPITAL_BASED_OUTPATIENT_CLINIC_OR_DEPARTMENT_OTHER)
Admission: RE | Admit: 2017-03-15 | Discharge: 2017-03-15 | Disposition: A | Discharge status: Home / Self Care | Payer: BC Managed Care – PPO | Source: Ambulatory Visit | Attending: General Surgery | Admitting: General Surgery

## 2017-03-15 ENCOUNTER — Other Ambulatory Visit: Payer: BC Managed Care – PPO

## 2017-03-15 ENCOUNTER — Encounter (HOSPITAL_BASED_OUTPATIENT_CLINIC_OR_DEPARTMENT_OTHER): Payer: Self-pay | Admitting: *Deleted

## 2017-03-15 ENCOUNTER — Telehealth: Payer: Self-pay | Admitting: Women's Health

## 2017-03-15 DIAGNOSIS — Z01810 Encounter for preprocedural cardiovascular examination: Secondary | ICD-10-CM | POA: Diagnosis present

## 2017-03-15 DIAGNOSIS — C50512 Malignant neoplasm of lower-outer quadrant of left female breast: Secondary | ICD-10-CM | POA: Diagnosis not present

## 2017-03-15 DIAGNOSIS — Z808 Family history of malignant neoplasm of other organs or systems: Secondary | ICD-10-CM | POA: Diagnosis not present

## 2017-03-15 DIAGNOSIS — J45909 Unspecified asthma, uncomplicated: Secondary | ICD-10-CM | POA: Diagnosis not present

## 2017-03-15 DIAGNOSIS — I10 Essential (primary) hypertension: Secondary | ICD-10-CM | POA: Diagnosis not present

## 2017-03-15 DIAGNOSIS — Z170 Estrogen receptor positive status [ER+]: Secondary | ICD-10-CM | POA: Diagnosis not present

## 2017-03-15 DIAGNOSIS — Z803 Family history of malignant neoplasm of breast: Secondary | ICD-10-CM | POA: Diagnosis not present

## 2017-03-15 NOTE — Progress Notes (Signed)
[place] Counseling Note  Called pt to f/u on patient's emotional wellbeing after meeting at [facility] on 5/30. LVM with patient, and will call back tomorrow 6/6.  Westly Pam, [place] LPCA [place]

## 2017-03-15 NOTE — Telephone Encounter (Signed)
Message left for follow-up, instructed to call if any questions

## 2017-03-15 NOTE — Progress Notes (Signed)
Pt walked in from appt with Dr Donne Hazel for pre op screening. PAT appt done also. Boost breeze given to pt and instructions given.

## 2017-03-16 NOTE — Progress Notes (Signed)
[place] Counseling Note  Called patient to f/u on patient's emotional wellbeing following our meeting at [facility] on 5/31. LVM with patient, letting patient know that counselor is available to provide any emotional support needed or help answer any questions.  Westly Pam, [place] LPCA [place]

## 2017-03-16 NOTE — Progress Notes (Signed)
EKG reviewed by Dr. Lissa Hoard. No further testing needed at this time.

## 2017-03-18 ENCOUNTER — Ambulatory Visit (HOSPITAL_COMMUNITY)
Admission: RE | Admit: 2017-03-18 | Discharge: 2017-03-18 | Disposition: A | Discharge status: Home / Self Care | Payer: BC Managed Care – PPO | Source: Ambulatory Visit | Attending: Oncology | Admitting: Oncology

## 2017-03-18 DIAGNOSIS — Z01810 Encounter for preprocedural cardiovascular examination: Secondary | ICD-10-CM | POA: Insufficient documentation

## 2017-03-18 DIAGNOSIS — C50512 Malignant neoplasm of lower-outer quadrant of left female breast: Secondary | ICD-10-CM | POA: Insufficient documentation

## 2017-03-18 DIAGNOSIS — Z170 Estrogen receptor positive status [ER+]: Secondary | ICD-10-CM | POA: Insufficient documentation

## 2017-03-18 NOTE — Progress Notes (Signed)
  Echocardiogram 2D Echocardiogram has been performed.  Haley Sanchez 03/18/2017, 12:09 PM

## 2017-03-22 ENCOUNTER — Other Ambulatory Visit: Payer: BC Managed Care – PPO

## 2017-03-24 ENCOUNTER — Encounter (HOSPITAL_BASED_OUTPATIENT_CLINIC_OR_DEPARTMENT_OTHER): Payer: Self-pay | Admitting: Anesthesiology

## 2017-03-24 ENCOUNTER — Encounter (HOSPITAL_BASED_OUTPATIENT_CLINIC_OR_DEPARTMENT_OTHER)
Admission: RE | Disposition: A | Discharge status: Home / Self Care | Payer: Self-pay | Source: Ambulatory Visit | Attending: General Surgery

## 2017-03-24 ENCOUNTER — Ambulatory Visit (HOSPITAL_BASED_OUTPATIENT_CLINIC_OR_DEPARTMENT_OTHER)
Admission: RE | Admit: 2017-03-24 | Discharge: 2017-03-24 | Disposition: A | Discharge status: Home / Self Care | Payer: BC Managed Care – PPO | Source: Ambulatory Visit | Attending: General Surgery | Admitting: General Surgery

## 2017-03-24 DIAGNOSIS — Z01818 Encounter for other preprocedural examination: Secondary | ICD-10-CM | POA: Diagnosis not present

## 2017-03-24 DIAGNOSIS — Z538 Procedure and treatment not carried out for other reasons: Secondary | ICD-10-CM | POA: Diagnosis not present

## 2017-03-24 HISTORY — DX: Major depressive disorder, single episode, unspecified: F32.9

## 2017-03-24 HISTORY — DX: Depression, unspecified: F32.A

## 2017-03-24 HISTORY — DX: Anxiety disorder, unspecified: F41.9

## 2017-03-24 SURGERY — INSERTION, TUNNELED CENTRAL VENOUS DEVICE, WITH PORT
Anesthesia: General

## 2017-03-24 MED ORDER — CHLORHEXIDINE GLUCONATE CLOTH 2 % EX PADS: 6.0000 | MEDICATED_PAD | Freq: Once | CUTANEOUS | Status: DC

## 2017-03-24 MED ORDER — MIDAZOLAM HCL 2 MG/2ML IJ SOLN: 1.0000 mg | INTRAMUSCULAR | Status: DC | PRN

## 2017-03-24 MED ORDER — PROPOFOL 10 MG/ML IV BOLUS
INTRAVENOUS | Status: AC
  Filled 2017-03-24: qty 20

## 2017-03-24 MED ORDER — GABAPENTIN 300 MG PO CAPS: 300.0000 mg | ORAL_CAPSULE | ORAL | Status: DC

## 2017-03-24 MED ORDER — LACTATED RINGERS IV SOLN: INTRAVENOUS | Status: DC

## 2017-03-24 MED ORDER — FENTANYL CITRATE (PF) 100 MCG/2ML IJ SOLN: 50.0000 ug | INTRAMUSCULAR | Status: DC | PRN

## 2017-03-24 MED ORDER — ACETAMINOPHEN 500 MG PO TABS: 1000.0000 mg | ORAL_TABLET | ORAL | Status: DC

## 2017-03-24 MED ORDER — CEFAZOLIN SODIUM-DEXTROSE 2-4 GM/100ML-% IV SOLN: 2.0000 g | INTRAVENOUS | Status: DC

## 2017-03-24 MED ORDER — SCOPOLAMINE 1 MG/3DAYS TD PT72: 1.0000 | MEDICATED_PATCH | Freq: Once | TRANSDERMAL | Status: DC | PRN

## 2017-03-24 NOTE — Progress Notes (Signed)
Patient reports drinking Orgain, organic protein drink at 1030 instead of [employer]. Notified Dr Elton Sin and Dr Donne Hazel. Case cancelled today and patient to call [facility] to reschedule.

## 2017-03-25 ENCOUNTER — Encounter (HOSPITAL_COMMUNITY): Payer: Self-pay | Admitting: *Deleted

## 2017-03-25 ENCOUNTER — Ambulatory Visit (HOSPITAL_COMMUNITY): Payer: BC Managed Care – PPO | Admitting: Internal Medicine

## 2017-03-25 ENCOUNTER — Ambulatory Visit: Payer: BC Managed Care – PPO | Admitting: Oncology

## 2017-03-25 ENCOUNTER — Other Ambulatory Visit (HOSPITAL_COMMUNITY): Payer: BC Managed Care – PPO

## 2017-03-25 NOTE — Progress Notes (Signed)
Pt denies SOB and chest pain but stated that after her diagnosis of cancer and prior to her echo she had been experiencing a " Flutter-Flop."  Pt made aware to stop taking  Aspirin, vitamins, fish oil, CO Q 10, and herbal medications. Do not take any NSAIDs ie: Ibuprofen, Advil, Naproxen, BC and Goody powder or any medication containing Aspirin. Pt verbalized understanding of all pre-op instructions.

## 2017-03-28 ENCOUNTER — Ambulatory Visit (HOSPITAL_COMMUNITY): Payer: BC Managed Care – PPO | Admitting: Anesthesiology

## 2017-03-28 ENCOUNTER — Encounter (HOSPITAL_COMMUNITY)
Admission: RE | Disposition: A | Discharge status: Home / Self Care | Payer: Self-pay | Source: Ambulatory Visit | Attending: General Surgery

## 2017-03-28 ENCOUNTER — Ambulatory Visit (HOSPITAL_COMMUNITY): Payer: BC Managed Care – PPO

## 2017-03-28 ENCOUNTER — Ambulatory Visit (HOSPITAL_COMMUNITY)
Admission: RE | Admit: 2017-03-28 | Discharge: 2017-03-28 | Disposition: A | Discharge status: Home / Self Care | Payer: BC Managed Care – PPO | Source: Ambulatory Visit | Attending: General Surgery | Admitting: General Surgery

## 2017-03-28 ENCOUNTER — Encounter (HOSPITAL_COMMUNITY): Payer: Self-pay | Admitting: Anesthesiology

## 2017-03-28 DIAGNOSIS — Z419 Encounter for procedure for purposes other than remedying health state, unspecified: Secondary | ICD-10-CM

## 2017-03-28 DIAGNOSIS — C50412 Malignant neoplasm of upper-outer quadrant of left female breast: Secondary | ICD-10-CM | POA: Diagnosis not present

## 2017-03-28 DIAGNOSIS — Z170 Estrogen receptor positive status [ER+]: Secondary | ICD-10-CM | POA: Insufficient documentation

## 2017-03-28 DIAGNOSIS — Z853 Personal history of malignant neoplasm of breast: Secondary | ICD-10-CM | POA: Insufficient documentation

## 2017-03-28 DIAGNOSIS — F419 Anxiety disorder, unspecified: Secondary | ICD-10-CM | POA: Diagnosis not present

## 2017-03-28 DIAGNOSIS — Z8249 Family history of ischemic heart disease and other diseases of the circulatory system: Secondary | ICD-10-CM | POA: Insufficient documentation

## 2017-03-28 DIAGNOSIS — F329 Major depressive disorder, single episode, unspecified: Secondary | ICD-10-CM | POA: Diagnosis not present

## 2017-03-28 DIAGNOSIS — Z95828 Presence of other vascular implants and grafts: Secondary | ICD-10-CM

## 2017-03-28 DIAGNOSIS — J45909 Unspecified asthma, uncomplicated: Secondary | ICD-10-CM | POA: Insufficient documentation

## 2017-03-28 DIAGNOSIS — I10 Essential (primary) hypertension: Secondary | ICD-10-CM | POA: Insufficient documentation

## 2017-03-28 HISTORY — PX: PORTACATH PLACEMENT: SHX2246

## 2017-03-28 HISTORY — DX: Other seasonal allergic rhinitis: J30.2

## 2017-03-28 HISTORY — DX: Malignant (primary) neoplasm, unspecified: C80.1

## 2017-03-28 LAB — HEMOGLOBIN: HEMOGLOBIN: 13.2 g/dL (ref 12.0–15.0)

## 2017-03-28 SURGERY — INSERTION, TUNNELED CENTRAL VENOUS DEVICE, WITH PORT
Anesthesia: General | Site: Chest | Laterality: Right

## 2017-03-28 MED ORDER — SODIUM CHLORIDE 0.9 % IV SOLN: 250.0000 mL | INTRAVENOUS | Status: DC | PRN

## 2017-03-28 MED ORDER — HEPARIN SOD (PORK) LOCK FLUSH 100 UNIT/ML IV SOLN
INTRAVENOUS | Status: AC
  Filled 2017-03-28: qty 5

## 2017-03-28 MED ORDER — CEFAZOLIN SODIUM-DEXTROSE 2-4 GM/100ML-% IV SOLN
INTRAVENOUS | Status: DC
Start: 2017-03-28 — End: 2017-03-28
  Filled 2017-03-28: qty 100

## 2017-03-28 MED ORDER — DEXAMETHASONE SODIUM PHOSPHATE 10 MG/ML IJ SOLN
INTRAMUSCULAR | Status: DC | PRN
  Administered 2017-03-28: 10 mg via INTRAVENOUS

## 2017-03-28 MED ORDER — FENTANYL CITRATE (PF) 250 MCG/5ML IJ SOLN
INTRAMUSCULAR | Status: AC
  Filled 2017-03-28: qty 5

## 2017-03-28 MED ORDER — HYDROCODONE-ACETAMINOPHEN 5-325 MG PO TABS: 1.0000 | ORAL_TABLET | ORAL | 0 refills | Status: DC | PRN

## 2017-03-28 MED ORDER — BUPIVACAINE HCL (PF) 0.25 % IJ SOLN
INTRAMUSCULAR | Status: DC | PRN
  Administered 2017-03-28: 5 mL

## 2017-03-28 MED ORDER — ACETAMINOPHEN 650 MG RE SUPP: 650.0000 mg | RECTAL | Status: DC | PRN

## 2017-03-28 MED ORDER — LACTATED RINGERS IV SOLN
INTRAVENOUS | Status: DC | PRN
  Administered 2017-03-28: 07:00:00 via INTRAVENOUS

## 2017-03-28 MED ORDER — SODIUM CHLORIDE 0.9 % IV SOLN
INTRAVENOUS | Status: DC | PRN
  Administered 2017-03-28: 500 mL

## 2017-03-28 MED ORDER — 0.9 % SODIUM CHLORIDE (POUR BTL) OPTIME
TOPICAL | Status: DC | PRN
  Administered 2017-03-28: 1000 mL

## 2017-03-28 MED ORDER — ONDANSETRON HCL 4 MG/2ML IJ SOLN
INTRAMUSCULAR | Status: AC
  Filled 2017-03-28: qty 2

## 2017-03-28 MED ORDER — OXYCODONE HCL 5 MG PO TABS
5.0000 mg | ORAL_TABLET | ORAL | Status: DC | PRN
  Administered 2017-03-28: 5 mg via ORAL

## 2017-03-28 MED ORDER — ONDANSETRON HCL 4 MG/2ML IJ SOLN
INTRAMUSCULAR | Status: DC | PRN
  Administered 2017-03-28: 4 mg via INTRAVENOUS

## 2017-03-28 MED ORDER — ACETAMINOPHEN 325 MG PO TABS: 650.0000 mg | ORAL_TABLET | ORAL | Status: DC | PRN

## 2017-03-28 MED ORDER — CEFAZOLIN SODIUM-DEXTROSE 2-3 GM-% IV SOLR
INTRAVENOUS | Status: DC | PRN
  Administered 2017-03-28: 2 g via INTRAVENOUS

## 2017-03-28 MED ORDER — FENTANYL CITRATE (PF) 100 MCG/2ML IJ SOLN
INTRAMUSCULAR | Status: DC | PRN
  Administered 2017-03-28: 100 ug via INTRAVENOUS

## 2017-03-28 MED ORDER — EPHEDRINE SULFATE 50 MG/ML IJ SOLN
INTRAMUSCULAR | Status: DC | PRN
  Administered 2017-03-28: 5 mg via INTRAVENOUS
  Administered 2017-03-28: 10 mg via INTRAVENOUS

## 2017-03-28 MED ORDER — PROPOFOL 10 MG/ML IV BOLUS
INTRAVENOUS | Status: DC | PRN
  Administered 2017-03-28: 150 mg via INTRAVENOUS

## 2017-03-28 MED ORDER — SODIUM CHLORIDE 0.9% FLUSH: 3.0000 mL | INTRAVENOUS | Status: DC | PRN

## 2017-03-28 MED ORDER — HEPARIN SOD (PORK) LOCK FLUSH 100 UNIT/ML IV SOLN
INTRAVENOUS | Status: DC | PRN
  Administered 2017-03-28: 500 [IU]

## 2017-03-28 MED ORDER — PROPOFOL 10 MG/ML IV BOLUS
INTRAVENOUS | Status: AC
  Filled 2017-03-28: qty 20

## 2017-03-28 MED ORDER — MORPHINE SULFATE (PF) 2 MG/ML IV SOLN: 2.0000 mg | INTRAVENOUS | Status: DC | PRN

## 2017-03-28 MED ORDER — BUPIVACAINE HCL (PF) 0.25 % IJ SOLN
INTRAMUSCULAR | Status: AC
  Filled 2017-03-28: qty 30

## 2017-03-28 MED ORDER — MORPHINE SULFATE (PF) 4 MG/ML IV SOLN
INTRAVENOUS | Status: AC
  Administered 2017-03-28: 2 mg
  Filled 2017-03-28: qty 1

## 2017-03-28 MED ORDER — OXYCODONE HCL 5 MG PO TABS
ORAL_TABLET | ORAL | Status: AC
  Filled 2017-03-28: qty 1

## 2017-03-28 MED ORDER — SUGAMMADEX SODIUM 200 MG/2ML IV SOLN
INTRAVENOUS | Status: AC
  Filled 2017-03-28: qty 4

## 2017-03-28 MED ORDER — LIDOCAINE HCL (CARDIAC) 20 MG/ML IV SOLN: INTRAVENOUS | Status: DC | PRN

## 2017-03-28 MED ORDER — DEXAMETHASONE SODIUM PHOSPHATE 10 MG/ML IJ SOLN
INTRAMUSCULAR | Status: AC
  Filled 2017-03-28: qty 1

## 2017-03-28 MED ORDER — MIDAZOLAM HCL 2 MG/2ML IJ SOLN
INTRAMUSCULAR | Status: AC
  Filled 2017-03-28: qty 2

## 2017-03-28 MED ORDER — PHENYLEPHRINE HCL 10 MG/ML IJ SOLN
INTRAMUSCULAR | Status: DC | PRN
  Administered 2017-03-28: 40 ug via INTRAVENOUS

## 2017-03-28 MED ORDER — SODIUM CHLORIDE 0.9% FLUSH: 3.0000 mL | Freq: Two times a day (BID) | INTRAVENOUS | Status: DC

## 2017-03-28 SURGICAL SUPPLY — 51 items
BAG DECANTER FOR FLEXI CONT (MISCELLANEOUS) ×3 IMPLANT
BLADE SURG 11 STRL SS (BLADE) ×3 IMPLANT
BLADE SURG 15 STRL LF DISP TIS (BLADE) ×1 IMPLANT
BLADE SURG 15 STRL SS (BLADE) ×2
CHLORAPREP W/TINT 26ML (MISCELLANEOUS) ×3 IMPLANT
COVER SURGICAL LIGHT HANDLE (MISCELLANEOUS) ×3 IMPLANT
COVER TRANSDUCER ULTRASND GEL (DRAPE) ×3 IMPLANT
CRADLE DONUT ADULT HEAD (MISCELLANEOUS) ×3 IMPLANT
DECANTER SPIKE VIAL GLASS SM (MISCELLANEOUS) ×3 IMPLANT
DERMABOND ADVANCED (GAUZE/BANDAGES/DRESSINGS) ×2
DERMABOND ADVANCED .7 DNX12 (GAUZE/BANDAGES/DRESSINGS) ×1 IMPLANT
DRAPE C-ARM 42X72 X-RAY (DRAPES) ×3 IMPLANT
DRAPE CHEST BREAST 15X10 FENES (DRAPES) ×3 IMPLANT
DRAPE UTILITY XL STRL (DRAPES) ×3 IMPLANT
DRSG TEGADERM 4X4.75 (GAUZE/BANDAGES/DRESSINGS) ×6 IMPLANT
ELECT CAUTERY BLADE 6.4 (BLADE) ×3 IMPLANT
ELECT REM PT RETURN 9FT ADLT (ELECTROSURGICAL) ×3
ELECTRODE REM PT RTRN 9FT ADLT (ELECTROSURGICAL) ×1 IMPLANT
GAUZE SPONGE 4X4 12PLY STRL (GAUZE/BANDAGES/DRESSINGS) ×3 IMPLANT
GAUZE SPONGE 4X4 16PLY XRAY LF (GAUZE/BANDAGES/DRESSINGS) ×3 IMPLANT
GEL ULTRASOUND 20GR AQUASONIC (MISCELLANEOUS) ×3 IMPLANT
GLOVE BIO SURGEON STRL SZ7 (GLOVE) ×3 IMPLANT
GLOVE BIOGEL PI IND STRL 6.5 (GLOVE) ×1 IMPLANT
GLOVE BIOGEL PI IND STRL 7.5 (GLOVE) ×1 IMPLANT
GLOVE BIOGEL PI INDICATOR 6.5 (GLOVE) ×2
GLOVE BIOGEL PI INDICATOR 7.5 (GLOVE) ×2
GOWN STRL REUS W/ TWL LRG LVL3 (GOWN DISPOSABLE) ×2 IMPLANT
GOWN STRL REUS W/TWL LRG LVL3 (GOWN DISPOSABLE) ×4
INTRODUCER COOK 11FR (CATHETERS) IMPLANT
KIT BASIN OR (CUSTOM PROCEDURE TRAY) ×3 IMPLANT
KIT POWER CATH 8FR (Port) ×3 IMPLANT
KIT ROOM TURNOVER OR (KITS) ×3 IMPLANT
NEEDLE HYPO 25GX1X1/2 BEV (NEEDLE) ×3 IMPLANT
NS IRRIG 1000ML POUR BTL (IV SOLUTION) ×3 IMPLANT
PACK SURGICAL SETUP 50X90 (CUSTOM PROCEDURE TRAY) ×3 IMPLANT
PAD ARMBOARD 7.5X6 YLW CONV (MISCELLANEOUS) ×6 IMPLANT
PENCIL BUTTON HOLSTER BLD 10FT (ELECTRODE) ×3 IMPLANT
SET INTRODUCER 12FR PACEMAKER (SHEATH) IMPLANT
SET SHEATH INTRODUCER 10FR (MISCELLANEOUS) IMPLANT
SHEATH COOK PEEL AWAY SET 9F (SHEATH) IMPLANT
SUT MNCRL AB 4-0 PS2 18 (SUTURE) ×3 IMPLANT
SUT PROLENE 2 0 SH DA (SUTURE) ×3 IMPLANT
SUT SILK 2 0 (SUTURE)
SUT SILK 2-0 18XBRD TIE 12 (SUTURE) IMPLANT
SUT VIC AB 3-0 SH 27 (SUTURE) ×2
SUT VIC AB 3-0 SH 27XBRD (SUTURE) ×1 IMPLANT
SYR 20ML ECCENTRIC (SYRINGE) ×6 IMPLANT
SYR 5ML LUER SLIP (SYRINGE) ×3 IMPLANT
SYR CONTROL 10ML LL (SYRINGE) IMPLANT
TOWEL OR 17X24 6PK STRL BLUE (TOWEL DISPOSABLE) ×3 IMPLANT
TOWEL OR 17X26 10 PK STRL BLUE (TOWEL DISPOSABLE) ×3 IMPLANT

## 2017-03-28 NOTE — Transfer of Care (Signed)
Immediate Anesthesia Transfer of Care Note  Patient: Haley Sanchez  Procedure(s) Performed: Procedure(s): INSERTION PORT-A-CATH WITH [place] (Right)  Patient Location: PACU  Anesthesia Type:General  Level of Consciousness: awake, alert  and oriented  Airway & Oxygen Therapy: Patient Spontanous Breathing and Patient connected to nasal cannula oxygen  Post-op Assessment: Report given to RN, Post -op Vital signs reviewed and stable and Patient moving all extremities X 4  Post vital signs: Reviewed and stable  Last Vitals:  Vitals:   03/28/17 0741  BP: (!) 142/85  Pulse: 68  Resp: 20  Temp: 37.1 C    Last Pain:  Vitals:   03/28/17 0733  PainSc: 0-No pain         Complications: No apparent anesthesia complications

## 2017-03-28 NOTE — Discharge Instructions (Signed)
    PORT-A-CATH: POST OP INSTRUCTIONS  Always review your discharge instruction sheet given to you by the facility where your surgery was performed.   1. A prescription for pain medication may be given to you upon discharge. Take your pain medication as prescribed, if needed. If narcotic pain medicine is not needed, then you make take acetaminophen (Tylenol) or ibuprofen (Advil) as needed.  2. Take your usually prescribed medications unless otherwise directed. 3. If you need a refill on your pain medication, please contact our office. All narcotic pain medicine now requires a paper prescription.  Phoned in and fax refills are no longer allowed by law.  Prescriptions will not be filled after 5 pm or on weekends.  4. You should follow a light diet for the remainder of the day after your procedure. 5. Most patients will experience some mild swelling and/or bruising in the area of the incision. It may take several days to resolve. 6. It is common to experience some constipation if taking pain medication after surgery. Increasing fluid intake and taking a stool softener (such as Colace) will usually help or prevent this problem from occurring. A mild laxative (Milk of Magnesia or Miralax) should be taken according to package directions if there are no bowel movements after 48 hours.  7. Unless discharge instructions indicate otherwise, you may remove your bandages 48 hours after surgery, and you may shower at that time. You may have steri-strips (small white skin tapes) in place directly over the incision.  These strips should be left on the skin for 7-10 days.  If your surgeon used Dermabond (skin glue) on the incision, you may shower in 24 hours.  The glue will flake off over the next 2-3 weeks.  8. If your port is left accessed at the end of surgery (needle left in port), the dressing cannot get wet and should only by changed by a healthcare professional. When the port is no longer accessed (when the  needle has been removed), follow step 7.   9. ACTIVITIES:  Limit activity involving your arms for the next 72 hours. Do no strenuous exercise or activity for 1 week. You may drive when you are no longer taking prescription pain medication, you can comfortably wear a seatbelt, and you can maneuver your car. 10.You may need to see your doctor in the office for a follow-up appointment.  Please       check with your doctor.  11.When you receive a new Port-a-Cath, you will get a product guide and        ID card.  Please keep them in case you need them.  WHEN TO CALL YOUR DOCTOR ([phone]): 1. Fever over 101.0 2. Chills 3. Continued bleeding from incision 4. Increased redness and tenderness at the site 5. Shortness of breath, difficulty breathing   The clinic staff is available to answer your questions during regular business hours. Please don't hesitate to call and ask to speak to one of the nurses or medical assistants for clinical concerns. If you have a medical emergency, go to the nearest emergency room or call 911.  A surgeon from [facility] is always on call at the hospital.     For further information, please visit [email or web address]      

## 2017-03-28 NOTE — Anesthesia Procedure Notes (Signed)
Procedure Name: LMA Insertion Date/Time: 03/28/2017 9:38 AM Performed by: Neldon Newport Pre-anesthesia Checklist: Timeout performed, Patient being monitored, Suction available, Emergency Drugs available and Patient identified Patient Re-evaluated:Patient Re-evaluated prior to inductionOxygen Delivery Method: Circle system utilized Preoxygenation: Pre-oxygenation with 100% oxygen Intubation Type: IV induction Ventilation: Mask ventilation without difficulty LMA: LMA inserted LMA Size: 4.0 Number of attempts: 1 Placement Confirmation: breath sounds checked- equal and bilateral and positive ETCO2 Tube secured with: Tape Dental Injury: Teeth and Oropharynx as per pre-operative assessment

## 2017-03-28 NOTE — Anesthesia Postprocedure Evaluation (Signed)
Anesthesia Post Note  Patient: Delsy H Brumbaugh  Procedure(s) Performed: Procedure(s) (LRB): INSERTION PORT-A-CATH WITH [place] (Right)     Patient location during evaluation: PACU Anesthesia Type: General Level of consciousness: awake and alert Pain management: pain level controlled Vital Signs Assessment: post-procedure vital signs reviewed and stable Respiratory status: spontaneous breathing, nonlabored ventilation and respiratory function stable Cardiovascular status: blood pressure returned to baseline and stable Postop Assessment: no signs of nausea or vomiting Anesthetic complications: no    Last Vitals:  Vitals:   03/28/17 1042 03/28/17 1044  BP:  139/84  Pulse: 77 75  Resp: 18 (!) 21  Temp:      Last Pain:  Vitals:   03/28/17 1027  PainSc: 0-No pain                 Lynda Rainwater

## 2017-03-28 NOTE — Interval H&P Note (Signed)
History and Physical Interval Note:  03/28/2017 8:56 AM  Haley Sanchez  has presented today for surgery, with the diagnosis of BREAST CANCER  The various methods of treatment have been discussed with the patient and family. After consideration of risks, benefits and other options for treatment, the patient has consented to  Procedure(s): INSERTION PORT-A-CATH WITH [place] (N/A) as a surgical intervention .  The patient's history has been reviewed, patient examined, no change in status, stable for surgery.  I have reviewed the patient's chart and labs.  Questions were answered to the patient's satisfaction.     [name]

## 2017-03-28 NOTE — Anesthesia Preprocedure Evaluation (Addendum)
Anesthesia Evaluation  Patient identified by MRN, date of birth, ID band Patient awake    Reviewed: Allergy & Precautions, H&P , Patient's Chart, lab work & pertinent test results  Airway Mallampati: I  TM Distance: >3 FB Neck ROM: full    Dental no notable dental hx. (+) Teeth Intact, Dental Advidsory Given   Pulmonary asthma ,    Pulmonary exam normal        Cardiovascular hypertension, On Medications Normal cardiovascular exam Rhythm:regular     Neuro/Psych PSYCHIATRIC DISORDERS    GI/Hepatic   Endo/Other    Renal/GU      Musculoskeletal   Abdominal   Peds  Hematology   Anesthesia Other Findings   Reproductive/Obstetrics                            Anesthesia Physical Anesthesia Plan  ASA: II  Anesthesia Plan: General   Post-op Pain Management:    Induction:   PONV Risk Score and Plan:   Airway Management Planned:   Additional Equipment:   Intra-op Plan:   Post-operative Plan:   Informed Consent:   Dental Advisory Given  Plan Discussed with: Anesthesiologist, CRNA and Surgeon  Anesthesia Plan Comments:         Anesthesia Quick Evaluation

## 2017-03-28 NOTE — H&P (Signed)
Haley Sanchez is an 56 y.o. female.   Chief Complaint: breast cancer HPI: 17 yof who was seen recently in [place] with new left breast cancer. she palpated this mass and underwent a mm that shows b density breasts. there is a 1.6 cm round mass with spiculated margin 5 cm from nippla that is at 3 oclock. she also underwent US that shows a 2.1 cm irregular mass in the left breast with another 0.8 cm possibly intraductal mass at 4 oclock 1 cm from nipple. there are 3 normal appearing nodes in the left axilla. she has no prior breast issues. 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 biopsy of second area was fcc and this is apparently concordant. mri shows a 2 cm biopsy proven cancer, no other disease on left, postbiopsy change from other biopsy , no abnl nodes and no right breast abnormality   Past Medical History:  Diagnosis Date  . Anxiety   . Asthma   . Cancer [facility])    breast cancer  . Depression   . Hypertension   . Seasonal allergies     History reviewed. No pertinent surgical history.  Family History  Problem Relation Age of Onset  . Heart disease Mother   . Hypertension Mother   . Kidney failure Father   . Diabetes Father   . Hypertension Father   . Brain cancer Maternal Grandmother   . Breast cancer Paternal Grandmother    Social History:  reports that she has never smoked. She has never used smokeless tobacco. She reports that she uses drugs, including Marijuana. She reports that she does not drink alcohol.  Allergies:  Allergies  Allergen Reactions  . No Known Allergies     Medications Prior to Admission  Medication Sig Dispense Refill  . buPROPion (WELLBUTRIN XL) 300 MG 24 hr tablet Take 300 mg by mouth daily.    . Coenzyme Q10 (COQ10) 100 MG CAPS Take 1 capsule by mouth daily.    . Cyanocobalamin (B-12) 1000 MCG SUBL Place 1 tablet under the tongue daily.    Marland Kitchen lisdexamfetamine (VYVANSE) 50 MG capsule Take 100 mg by mouth daily as needed  (Take only when working).    Marland Kitchen losartan (COZAAR) 100 MG tablet Take 1 tablet by mouth daily.  0  . PARoxetine (PAXIL) 40 MG tablet Take 40 mg by mouth every morning.    . lidocaine-prilocaine (EMLA) cream Apply to affected area once 30 g 3  . ondansetron (ZOFRAN) 8 MG tablet Take 1 tablet (8 mg total) by mouth 2 (two) times daily as needed (Nausea or vomiting). 30 tablet 1  . prochlorperazine (COMPAZINE) 10 MG tablet Take 1 tablet (10 mg total) by mouth every 6 (six) hours as needed (Nausea or vomiting). 30 tablet 1    No results found for this or any previous visit (from the past 48 hour(s)). No results found.  ROS Negative  There were no vitals taken for this visit. Physical Exam  Vitals (Alisha Spillers CMA; 03/15/2017 12:23 PM) 03/15/2017 12:23 PM Weight: 157 lb Height: 66.5in Body Surface Area: 1.81 m Body Mass Index: 24.96 kg/m  Pulse: 84 (Regular)  BP: 118/68 (Sitting, Left Arm, Standard) Physical Exam Rolm Bookbinder MD; 03/15/2017 3:31 PM) Breast Note: 2 cm uoq left breast mobile mass with surrounding hematoma cv rrr pulm clear bilaterally  Assessment/Plan MALIGNANT NEOPLASM OF UPPER-OUTER QUADRANT OF LEFT BREAST IN FEMALE, ESTROGEN RECEPTOR POSITIVE (C50.412) Story: Port placement I discussed  mri results today. I think reasonable to continue plan of primary chemo anti her2 to assess response later and shrink to some degree. I think this likely wont completely respond given tumor characteristics. we discussed right ij port placement today with risks/benefits, visible line in neck as well. we also discussed chemo followed by surgery 3-4 weeks after that with mri at end of therapy. we discussed sn biopsy as well as lumpectomy vs mastectomy. I think she would be good lumpectomy candidate at that time (she could do this now likely but will give better result if shrinks). I will schedule port placement Tinlee Navarrette, MD 03/28/2017, 7:17 AM

## 2017-03-28 NOTE — Op Note (Signed)
Preoperative diagnosis: breast cancer need for venous access Postoperative diagnosis: same as above Procedure: right ij US guided powerport insertion Surgeon: Dr Serita Grammes EBL: minimal Anes: general  Specimens none Complications none Drains none Sponge count correct Dispo to pacu stable  Indications: This is a 69 yof with triple positive breast cancer who will begin primary chemotherapy.  Discussed port placement.  Procedure: After informed consent was obtained the patient was taken to the operating room. She was given antibiotics. Sequential compression devices were on her legs. She was then placed under general anesthesia with an LMA. Then she was prepped and draped in the standard sterile surgical fashion. Surgical timeout was then performed.  I used the ultrasound to identify the right internal jugular vein. I then accessed the vein using the ultrasound.This aspirated blood. I then placed the wire. This was confirmed by fluoroscopy and ultrasound to be in the correct position. I created a pocket on the right chest. I tunneled the line between the 2 sites. I then dilated the tract and placed the dilator assembly with the sheath. This was done under fluoroscopy. I then removed the sheath and dilator. The wire was also removed. The line was then pulled back to be in the venacava. I hooked this up to the port. I sutured this into place with 2-0 Prolene in 2 places. This aspirated blood and flushed easily.This was confirmed with a final fluoroscopy. I then closed this with 2-0 Vicryl and 4-0 Monocryl. I accessed the port and it flushed easily and withdrew blood.  I left this accessed for chemotherapy tomorrow. Dermabond was placed on both the incisions.A dressing was placed. She tolerated this well and was transferred to the recovery room in stable condition

## 2017-03-28 NOTE — Progress Notes (Signed)
Patient ID: Haley Sanchez, female   DOB: 1960/11/09, 56 y.o.   MRN: [id number] [place] reviewed day of surgery, norco given postop

## 2017-03-29 ENCOUNTER — Other Ambulatory Visit (HOSPITAL_BASED_OUTPATIENT_CLINIC_OR_DEPARTMENT_OTHER): Payer: BC Managed Care – PPO

## 2017-03-29 ENCOUNTER — Encounter: Payer: Self-pay | Admitting: *Deleted

## 2017-03-29 ENCOUNTER — Ambulatory Visit (HOSPITAL_BASED_OUTPATIENT_CLINIC_OR_DEPARTMENT_OTHER): Payer: BC Managed Care – PPO

## 2017-03-29 ENCOUNTER — Ambulatory Visit (HOSPITAL_BASED_OUTPATIENT_CLINIC_OR_DEPARTMENT_OTHER): Payer: BC Managed Care – PPO | Admitting: Adult Health

## 2017-03-29 ENCOUNTER — Encounter (HOSPITAL_COMMUNITY): Payer: Self-pay | Admitting: General Surgery

## 2017-03-29 VITALS — BP 127/86 | HR 82 | Temp 98.2°F | Resp 18 | Ht 66.0 in | Wt 161.4 lb

## 2017-03-29 VITALS — BP 135/86 | HR 74 | Temp 98.6°F | Resp 18

## 2017-03-29 DIAGNOSIS — C50512 Malignant neoplasm of lower-outer quadrant of left female breast: Secondary | ICD-10-CM

## 2017-03-29 DIAGNOSIS — Z170 Estrogen receptor positive status [ER+]: Secondary | ICD-10-CM

## 2017-03-29 DIAGNOSIS — Z5111 Encounter for antineoplastic chemotherapy: Secondary | ICD-10-CM | POA: Diagnosis not present

## 2017-03-29 DIAGNOSIS — Z5112 Encounter for antineoplastic immunotherapy: Secondary | ICD-10-CM | POA: Diagnosis not present

## 2017-03-29 LAB — CBC WITH DIFFERENTIAL/PLATELET
BASO%: 0.3 % (ref 0.0–2.0)
BASOS ABS: 0 10*3/uL (ref 0.0–0.1)
EOS%: 0.7 % (ref 0.0–7.0)
Eosinophils Absolute: 0 10*3/uL (ref 0.0–0.5)
HEMATOCRIT: 36.6 % (ref 34.8–46.6)
HEMOGLOBIN: 12.5 g/dL (ref 11.6–15.9)
LYMPH#: 1.1 10*3/uL (ref 0.9–3.3)
LYMPH%: 27.3 % (ref 14.0–49.7)
MCH: 30.9 pg (ref 25.1–34.0)
MCHC: 34.2 g/dL (ref 31.5–36.0)
MCV: 90.3 fL (ref 79.5–101.0)
MONO#: 0.3 10*3/uL (ref 0.1–0.9)
MONO%: 7.2 % (ref 0.0–14.0)
NEUT#: 2.7 10*3/uL (ref 1.5–6.5)
NEUT%: 64.5 % (ref 38.4–76.8)
PLATELETS: 177 10*3/uL (ref 145–400)
RBC: 4.05 10*6/uL (ref 3.70–5.45)
RDW: 12.9 % (ref 11.2–14.5)
WBC: 4.1 10*3/uL (ref 3.9–10.3)

## 2017-03-29 LAB — COMPREHENSIVE METABOLIC PANEL
ALBUMIN: 3.7 g/dL (ref 3.5–5.0)
ALK PHOS: 78 U/L (ref 40–150)
ALT: 11 U/L (ref 0–55)
AST: 13 U/L (ref 5–34)
Anion Gap: 9 mEq/L (ref 3–11)
BILIRUBIN TOTAL: 0.27 mg/dL (ref 0.20–1.20)
BUN: 14.8 mg/dL (ref 7.0–26.0)
CALCIUM: 9.3 mg/dL (ref 8.4–10.4)
CO2: 28 mEq/L (ref 22–29)
CREATININE: 1 mg/dL (ref 0.6–1.1)
Chloride: 106 mEq/L (ref 98–109)
EGFR: 65 mL/min/{1.73_m2} — ABNORMAL LOW (ref >90)
Glucose: 102 mg/dl (ref 70–140)
Potassium: 3.3 mEq/L — ABNORMAL LOW (ref 3.5–5.1)
Sodium: 143 mEq/L (ref 136–145)
Total Protein: 6.3 g/dL — ABNORMAL LOW (ref 6.4–8.3)

## 2017-03-29 MED ORDER — HEPARIN SOD (PORK) LOCK FLUSH 100 UNIT/ML IV SOLN
500.0000 [IU] | Freq: Once | INTRAVENOUS | Status: AC | PRN
  Administered 2017-03-29: 500 [IU]
  Filled 2017-03-29: qty 5

## 2017-03-29 MED ORDER — SODIUM CHLORIDE 0.9 % IV SOLN
10.0000 mg | Freq: Once | INTRAVENOUS | Status: AC
  Administered 2017-03-29: 10 mg via INTRAVENOUS
  Filled 2017-03-29: qty 1

## 2017-03-29 MED ORDER — ACETAMINOPHEN 325 MG PO TABS
ORAL_TABLET | ORAL | Status: AC
  Filled 2017-03-29: qty 2

## 2017-03-29 MED ORDER — TRASTUZUMAB CHEMO 150 MG IV SOLR
300.0000 mg | Freq: Once | INTRAVENOUS | Status: AC
  Administered 2017-03-29: 300 mg via INTRAVENOUS
  Filled 2017-03-29: qty 14.29

## 2017-03-29 MED ORDER — FAMOTIDINE IN NACL 20-0.9 MG/50ML-% IV SOLN
20.0000 mg | Freq: Once | INTRAVENOUS | Status: AC
  Administered 2017-03-29: 20 mg via INTRAVENOUS

## 2017-03-29 MED ORDER — DIPHENHYDRAMINE HCL 50 MG/ML IJ SOLN
25.0000 mg | Freq: Once | INTRAMUSCULAR | Status: AC
  Administered 2017-03-29: 25 mg via INTRAVENOUS

## 2017-03-29 MED ORDER — FAMOTIDINE IN NACL 20-0.9 MG/50ML-% IV SOLN
INTRAVENOUS | Status: AC
  Filled 2017-03-29: qty 50

## 2017-03-29 MED ORDER — PACLITAXEL CHEMO INJECTION 300 MG/50ML
80.0000 mg/m2 | Freq: Once | INTRAVENOUS | Status: AC
  Administered 2017-03-29: 150 mg via INTRAVENOUS
  Filled 2017-03-29: qty 25

## 2017-03-29 MED ORDER — SODIUM CHLORIDE 0.9 % IV SOLN
Freq: Once | INTRAVENOUS | Status: AC
  Administered 2017-03-29: 11:00:00 via INTRAVENOUS

## 2017-03-29 MED ORDER — ACETAMINOPHEN 325 MG PO TABS
650.0000 mg | ORAL_TABLET | Freq: Once | ORAL | Status: AC
  Administered 2017-03-29: 650 mg via ORAL

## 2017-03-29 MED ORDER — SODIUM CHLORIDE 0.9% FLUSH
10.0000 mL | INTRAVENOUS | Status: DC | PRN
  Administered 2017-03-29: 10 mL
  Filled 2017-03-29: qty 10

## 2017-03-29 MED ORDER — DIPHENHYDRAMINE HCL 50 MG/ML IJ SOLN
INTRAMUSCULAR | Status: AC
  Filled 2017-03-29: qty 1

## 2017-03-29 NOTE — Assessment & Plan Note (Addendum)
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 After reviewing the pathology in detail, we proceeded to discuss the different treatment options between surgery, radiation, chemotherapy, antiestrogen therapies.  Recommendation: 1. Neoadjuvant Taxol Herceptin weekly 12 followed by Herceptin maintenance for 6 months 2. breast conserving surgery with sentinel lymph node biopsy 3. Adjuvant radiation therapy 4. Followed by adjuvant antiestrogen therapy ----------------------------------------------------------------------------------------------------------------------------------------- Haley Sanchez is doing well today.  She will proceed with treatment.  She underwent echocardiogram and her labs are normal with exception of a mildly decreased potassium level.  I gave her a handout of potassium rich foods to eat.  She will return in one week for labs, an appt and her next treatment.

## 2017-03-29 NOTE — Patient Instructions (Addendum)
[facility] Discharge Instructions for Patients Receiving Chemotherapy  Today you received the following chemotherapy agents: Herceptin and Taxol.  To help prevent nausea and vomiting after your treatment, we encourage you to take your nausea medication as directed by your doctor.   If you develop nausea and vomiting that is not controlled by your nausea medication, call the clinic.   BELOW ARE SYMPTOMS THAT SHOULD BE REPORTED IMMEDIATELY:  *FEVER GREATER THAN 100.5 F  *CHILLS WITH OR WITHOUT FEVER  NAUSEA AND VOMITING THAT IS NOT CONTROLLED WITH YOUR NAUSEA MEDICATION  *UNUSUAL SHORTNESS OF BREATH  *UNUSUAL BRUISING OR BLEEDING  TENDERNESS IN MOUTH AND THROAT WITH OR WITHOUT PRESENCE OF ULCERS  *URINARY PROBLEMS  *BOWEL PROBLEMS  UNUSUAL RASH Items with * indicate a potential emergency and should be followed up as soon as possible.  Feel free to call the clinic you have any questions or concerns. The clinic phone number is [phone].  Please show the [facility] at check-in to the Emergency Department and triage nurse.  Trastuzumab (Herceptin) injection for infusion What is this medicine? TRASTUZUMAB (tras TOO zoo mab) is a monoclonal antibody. It is used to treat breast cancer and stomach cancer. This medicine may be used for other purposes; ask your health care provider or pharmacist if you have questions. COMMON BRAND NAME(S): Herceptin What should I tell my health care provider before I take this medicine? They need to know if you have any of these conditions: -heart disease -heart failure -lung or breathing disease, like asthma -an unusual or allergic reaction to trastuzumab, benzyl alcohol, or other medications, foods, dyes, or preservatives -pregnant or trying to get pregnant -breast-feeding How should I use this medicine? This drug is given as an infusion into a vein. It is administered in a hospital or clinic by a specially trained  health care professional. Talk to your pediatrician regarding the use of this medicine in children. This medicine is not approved for use in children. Overdosage: If you think you have taken too much of this medicine contact a poison control center or emergency room at once. NOTE: This medicine is only for you. Do not share this medicine with others. What if I miss a dose? It is important not to miss a dose. Call your doctor or health care professional if you are unable to keep an appointment. What may interact with this medicine? This medicine may interact with the following medications: -certain types of chemotherapy, such as daunorubicin, doxorubicin, epirubicin, and idarubicin This list may not describe all possible interactions. Give your health care provider a list of all the medicines, herbs, non-prescription drugs, or dietary supplements you use. Also tell them if you smoke, drink alcohol, or use illegal drugs. Some items may interact with your medicine. What should I watch for while using this medicine? Visit your doctor for checks on your progress. Report any side effects. Continue your course of treatment even though you feel ill unless your doctor tells you to stop. Call your doctor or health care professional for advice if you get a fever, chills or sore throat, or other symptoms of a cold or flu. Do not treat yourself. Try to avoid being around people who are sick. You may experience fever, chills and shaking during your first infusion. These effects are usually mild and can be treated with other medicines. Report any side effects during the infusion to your health care professional. Fever and chills usually do not happen with later infusions.  Do not become pregnant while taking this medicine or for 7 months after stopping it. Women should inform their doctor if they wish to become pregnant or think they might be pregnant. Women of child-bearing potential will need to have a negative  pregnancy test before starting this medicine. There is a potential for serious side effects to an unborn child. Talk to your health care professional or pharmacist for more information. Do not breast-feed an infant while taking this medicine or for 7 months after stopping it. Women must use effective birth control with this medicine. What side effects may I notice from receiving this medicine? Side effects that you should report to your doctor or health care professional as soon as possible: -allergic reactions like skin rash, itching or hives, swelling of the face, lips, or tongue -chest pain or palpitations -cough -dizziness -feeling faint or lightheaded, falls -fever -general ill feeling or flu-like symptoms -signs of worsening heart failure like breathing problems; swelling in your legs and feet -unusually weak or tired Side effects that usually do not require medical attention (report to your doctor or health care professional if they continue or are bothersome): -bone pain -changes in taste -diarrhea -joint pain -nausea/vomiting -weight loss This list may not describe all possible side effects. Call your doctor for medical advice about side effects. You may report side effects to FDA at 1-800-FDA-1088. Where should I keep my medicine? This drug is given in a hospital or clinic and will not be stored at home. NOTE: This sheet is a summary. It may not cover all possible information. If you have questions about this medicine, talk to your doctor, pharmacist, or health care provider.  2018 Elsevier/Gold Standard (2016-09-21 14:37:52)  Paclitaxel (Taxol) injection What is this medicine? PACLITAXEL (PAK li TAX el) is a chemotherapy drug. It targets fast dividing cells, like cancer cells, and causes these cells to die. This medicine is used to treat ovarian cancer, breast cancer, and other cancers. This medicine may be used for other purposes; ask your health care provider or pharmacist  if you have questions. COMMON BRAND NAME(S): Onxol, Taxol What should I tell my health care provider before I take this medicine? They need to know if you have any of these conditions: -blood disorders -irregular heartbeat -infection (especially a virus infection such as chickenpox, cold sores, or herpes) -liver disease -previous or ongoing radiation therapy -an unusual or allergic reaction to paclitaxel, alcohol, polyoxyethylated castor oil, other chemotherapy agents, other medicines, foods, dyes, or preservatives -pregnant or trying to get pregnant -breast-feeding How should I use this medicine? This drug is given as an infusion into a vein. It is administered in a hospital or clinic by a specially trained health care professional. Talk to your pediatrician regarding the use of this medicine in children. Special care may be needed. Overdosage: If you think you have taken too much of this medicine contact a poison control center or emergency room at once. NOTE: This medicine is only for you. Do not share this medicine with others. What if I miss a dose? It is important not to miss your dose. Call your doctor or health care professional if you are unable to keep an appointment. What may interact with this medicine? Do not take this medicine with any of the following medications: -disulfiram -metronidazole This medicine may also interact with the following medications: -cyclosporine -diazepam -ketoconazole -medicines to increase blood counts like filgrastim, pegfilgrastim, sargramostim -other chemotherapy drugs like cisplatin, doxorubicin, epirubicin, etoposide, teniposide, vincristine -quinidine -  testosterone -vaccines -verapamil Talk to your doctor or health care professional before taking any of these medicines: -acetaminophen -aspirin -ibuprofen -ketoprofen -naproxen This list may not describe all possible interactions. Give your health care provider a list of all the  medicines, herbs, non-prescription drugs, or dietary supplements you use. Also tell them if you smoke, drink alcohol, or use illegal drugs. Some items may interact with your medicine. What should I watch for while using this medicine? Your condition will be monitored carefully while you are receiving this medicine. You will need important blood work done while you are taking this medicine. This medicine can cause serious allergic reactions. To reduce your risk you will need to take other medicine(s) before treatment with this medicine. If you experience allergic reactions like skin rash, itching or hives, swelling of the face, lips, or tongue, tell your doctor or health care professional right away. In some cases, you may be given additional medicines to help with side effects. Follow all directions for their use. This drug may make you feel generally unwell. This is not uncommon, as chemotherapy can affect healthy cells as well as cancer cells. Report any side effects. Continue your course of treatment even though you feel ill unless your doctor tells you to stop. Call your doctor or health care professional for advice if you get a fever, chills or sore throat, or other symptoms of a cold or flu. Do not treat yourself. This drug decreases your body's ability to fight infections. Try to avoid being around people who are sick. This medicine may increase your risk to bruise or bleed. Call your doctor or health care professional if you notice any unusual bleeding. Be careful brushing and flossing your teeth or using a toothpick because you may get an infection or bleed more easily. If you have any dental work done, tell your dentist you are receiving this medicine. Avoid taking products that contain aspirin, acetaminophen, ibuprofen, naproxen, or ketoprofen unless instructed by your doctor. These medicines may hide a fever. Do not become pregnant while taking this medicine. Women should inform their doctor if  they wish to become pregnant or think they might be pregnant. There is a potential for serious side effects to an unborn child. Talk to your health care professional or pharmacist for more information. Do not breast-feed an infant while taking this medicine. Men are advised not to father a child while receiving this medicine. This product may contain alcohol. Ask your pharmacist or healthcare provider if this medicine contains alcohol. Be sure to tell all healthcare providers you are taking this medicine. Certain medicines, like metronidazole and disulfiram, can cause an unpleasant reaction when taken with alcohol. The reaction includes flushing, headache, nausea, vomiting, sweating, and increased thirst. The reaction can last from 30 minutes to several hours. What side effects may I notice from receiving this medicine? Side effects that you should report to your doctor or health care professional as soon as possible: -allergic reactions like skin rash, itching or hives, swelling of the face, lips, or tongue -low blood counts - This drug may decrease the number of white blood cells, red blood cells and platelets. You may be at increased risk for infections and bleeding. -signs of infection - fever or chills, cough, sore throat, pain or difficulty passing urine -signs of decreased platelets or bleeding - bruising, pinpoint red spots on the skin, black, tarry stools, nosebleeds -signs of decreased red blood cells - unusually weak or tired, fainting spells, lightheadedness -breathing problems -  chest pain -high or low blood pressure -mouth sores -nausea and vomiting -pain, swelling, redness or irritation at the injection site -pain, tingling, numbness in the hands or feet -slow or irregular heartbeat -swelling of the ankle, feet, hands Side effects that usually do not require medical attention (report to your doctor or health care professional if they continue or are bothersome): -bone  pain -complete hair loss including hair on your head, underarms, pubic hair, eyebrows, and eyelashes -changes in the color of fingernails -diarrhea -loosening of the fingernails -loss of appetite -muscle or joint pain -red flush to skin -sweating This list may not describe all possible side effects. Call your doctor for medical advice about side effects. You may report side effects to FDA at 1-800-FDA-1088. Where should I keep my medicine? This drug is given in a hospital or clinic and will not be stored at home. NOTE: This sheet is a summary. It may not cover all possible information. If you have questions about this medicine, talk to your doctor, pharmacist, or health care provider.  2018 Elsevier/Gold Standard (2015-07-29 19:58:00)

## 2017-03-29 NOTE — Patient Instructions (Signed)

## 2017-03-29 NOTE — Progress Notes (Signed)
[facility] Follow up:    Haley Melter, MD [address] [place] [address]   DIAGNOSIS: Cancer Staging Malignant neoplasm of lower-outer quadrant of left breast of female, estrogen receptor positive ([facility]) Staging form: Breast, AJCC 8th Edition - Clinical stage from 03/09/2017: Stage IB (cT2, cN0, cM0, G3, ER: Positive, PR: Positive, HER2: Positive) - Unsigned Staging comments: Staged at breast conference on 5.30.18   SUMMARY OF ONCOLOGIC HISTORY:   Malignant neoplasm of lower-outer quadrant of left breast of female, estrogen receptor positive ([facility])   03/01/2017 Initial Diagnosis    Left breast 2:30 position: 2.1 cm irregular solid mass, additional 0.8 cm oval mass in the left breast (fibrocystic); 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 female returns for evaluation prior to receiving neoadjuvant treatment for her breast cancer.  She is doing well today.  She is sore from her port placement yesterday, but otherwise she isn't having any issues.     Patient Active Problem List   Diagnosis Date Noted  . Malignant neoplasm of lower-outer quadrant of left breast of female, estrogen receptor positive ([facility]) 03/03/2017    is allergic to no known allergies.  MEDICAL HISTORY: Past Medical History:  Diagnosis Date  . Anxiety   . Asthma   . Cancer [facility])    breast cancer  . Depression   . Hypertension   . Seasonal allergies     SURGICAL HISTORY: Past Surgical History:  Procedure Laterality Date  . PORTACATH PLACEMENT Right 03/28/2017   Procedure: INSERTION PORT-A-CATH WITH [place];  Surgeon: Rolm Bookbinder, MD;  Location: [facility];  Service: General;  Laterality: Right;    SOCIAL HISTORY: Social History   Social History  . Marital status: Divorced   Spouse name: N/A  . Number of children: N/A  . Years of education: N/A   Occupational History  . Not on file.   Social History Main Topics  . Smoking status: Never Smoker  . Smokeless tobacco: Never Used  . Alcohol use No  . Drug use: Yes    Types: Marijuana     Comment: last use 03/25/17  . Sexual activity: Not Currently   Other Topics Concern  . Not on file   Social History Narrative  . No narrative on file    FAMILY HISTORY: Family History  Problem Relation Age of Onset  . Heart disease Mother   . Hypertension Mother   . Kidney failure Father   . Diabetes Father   . Hypertension Father   . Brain cancer Maternal Grandmother   . Breast cancer Paternal Grandmother     Review of Systems  Constitutional: Negative for appetite change, chills, diaphoresis, fatigue, fever and unexpected weight change.  HENT:   Negative for hearing loss and lump/mass.   Eyes: Negative for eye problems and icterus.  Respiratory: Negative for chest tightness and cough.   Cardiovascular: Negative for chest pain, leg swelling and palpitations.  Gastrointestinal: Negative for abdominal distention and abdominal pain.  Endocrine: Negative for hot flashes.  Genitourinary: Negative for difficulty urinating.   Musculoskeletal: Negative for arthralgias.  Skin: Negative for itching and rash.  Neurological: Negative for dizziness, extremity weakness and headaches.  Hematological: Negative for adenopathy. Does not bruise/bleed easily.  Psychiatric/Behavioral: Negative for  depression. The patient is not nervous/anxious.       PHYSICAL EXAMINATION  ECOG PERFORMANCE STATUS: 0 - Asymptomatic  Vitals:   03/29/17 0933  BP: 127/86  Pulse: 82  Resp: 18  Temp: 98.2 F (36.8 C)    Physical Exam  Constitutional: She is oriented to person, place, and time and well-developed, well-nourished, and in no distress.  HENT:  Head: Normocephalic and atraumatic.  Mouth/Throat: Oropharynx is clear and  moist. No oropharyngeal exudate.  Eyes: Pupils are equal, round, and reactive to light. No scleral icterus.  Neck: Neck supple.  Cardiovascular: Normal rate, regular rhythm and normal heart sounds.   Pulmonary/Chest: Effort normal and breath sounds normal.  ~2cm breast mass palpated in left breast  Abdominal: Soft. Bowel sounds are normal. She exhibits no distension. There is no tenderness.  Musculoskeletal: She exhibits no edema.  Lymphadenopathy:    She has no cervical adenopathy.  Neurological: She is alert and oriented to person, place, and time.  Skin: Skin is warm and dry. No rash noted.  Psychiatric: Mood and affect normal.    LABORATORY DATA:  CBC    Component Value Date/Time   WBC 4.1 03/29/2017 0840   RBC 4.05 03/29/2017 0840   HGB 12.5 03/29/2017 0840   HCT 36.6 03/29/2017 0840   PLT 177 03/29/2017 0840   MCV 90.3 03/29/2017 0840   MCH 30.9 03/29/2017 0840   MCHC 34.2 03/29/2017 0840   RDW 12.9 03/29/2017 0840   LYMPHSABS 1.1 03/29/2017 0840   MONOABS 0.3 03/29/2017 0840   EOSABS 0.0 03/29/2017 0840   BASOSABS 0.0 03/29/2017 0840    CMP     Component Value Date/Time   NA 143 03/29/2017 0840   K 3.3 (L) 03/29/2017 0840   CO2 28 03/29/2017 0840   GLUCOSE 102 03/29/2017 0840   BUN 14.8 03/29/2017 0840   CREATININE 1.0 03/29/2017 0840   CALCIUM 9.3 03/29/2017 0840   PROT 6.3 (L) 03/29/2017 0840   ALBUMIN 3.7 03/29/2017 0840   AST 13 03/29/2017 0840   ALT 11 03/29/2017 0840   ALKPHOS 78 03/29/2017 0840   BILITOT 0.27 03/29/2017 0840      ASSESSMENT and  PLAN:   Malignant neoplasm of lower-outer quadrant of left breast of female, estrogen receptor positive ([facility]) Left breast 2:30 position: 2.1 cm irregular solid mass, additional 0.8 cm oval mass in the left breast (fibrocystic); 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 After reviewing the pathology in detail, we proceeded to discuss the different treatment options between surgery, radiation, chemotherapy, antiestrogen therapies.  Recommendation: 1. Neoadjuvant Taxol Herceptin weekly 12 followed by Herceptin maintenance for 6 months 2. breast conserving surgery with sentinel lymph node biopsy 3. Adjuvant radiation therapy 4. Followed by adjuvant antiestrogen therapy ----------------------------------------------------------------------------------------------------------------------------------------- Haley Sanchez is doing well today.  She will proceed with treatment.  She underwent echocardiogram and her labs are normal with exception of a mildly decreased potassium level.  I gave her a handout of potassium rich foods to eat.  She will return in one week for labs, an appt and her next treatment.     All questions were answered. The patient knows to call the clinic with any  problems, questions or concerns. We can certainly see the patient much sooner if necessary.  A total of (30) minutes of face-to-face time was spent with this patient with greater than 50% of that time in counseling and care-coordination.  This note was electronically signed. Scot Dock, NP 03/29/2017

## 2017-03-31 ENCOUNTER — Telehealth: Payer: Self-pay | Admitting: *Deleted

## 2017-03-31 NOTE — Telephone Encounter (Signed)
Patient received first taxol, herceptin on 6/19. This am doing well.  Able to eat and drink fluids. Had some nausea on Wednesday, some back pain and c/o port being sore. Explained to patient that normal for port to be sore the first few days after placement.  Some nausea yesterday, took med and none today.  Encouraged to push the fluids.  Aware to call us if needed.

## 2017-04-05 ENCOUNTER — Encounter: Payer: Self-pay | Admitting: General Practice

## 2017-04-05 ENCOUNTER — Encounter (HOSPITAL_COMMUNITY): Payer: Self-pay | Admitting: Internal Medicine

## 2017-04-05 ENCOUNTER — Ambulatory Visit (HOSPITAL_COMMUNITY)
Admission: RE | Admit: 2017-04-05 | Discharge: 2017-04-05 | Disposition: A | Discharge status: Home / Self Care | Payer: BC Managed Care – PPO | Source: Ambulatory Visit | Attending: Internal Medicine | Admitting: Internal Medicine

## 2017-04-05 ENCOUNTER — Other Ambulatory Visit (HOSPITAL_BASED_OUTPATIENT_CLINIC_OR_DEPARTMENT_OTHER): Payer: BC Managed Care – PPO

## 2017-04-05 ENCOUNTER — Ambulatory Visit: Payer: BC Managed Care – PPO

## 2017-04-05 ENCOUNTER — Ambulatory Visit (HOSPITAL_BASED_OUTPATIENT_CLINIC_OR_DEPARTMENT_OTHER): Payer: BC Managed Care – PPO

## 2017-04-05 VITALS — BP 152/106 | HR 78 | Wt 161.8 lb

## 2017-04-05 VITALS — BP 134/85 | HR 71 | Temp 98.6°F | Resp 18

## 2017-04-05 DIAGNOSIS — Z170 Estrogen receptor positive status [ER+]: Secondary | ICD-10-CM

## 2017-04-05 DIAGNOSIS — Z5111 Encounter for antineoplastic chemotherapy: Secondary | ICD-10-CM

## 2017-04-05 DIAGNOSIS — C50512 Malignant neoplasm of lower-outer quadrant of left female breast: Secondary | ICD-10-CM

## 2017-04-05 DIAGNOSIS — F329 Major depressive disorder, single episode, unspecified: Secondary | ICD-10-CM | POA: Insufficient documentation

## 2017-04-05 DIAGNOSIS — Z95828 Presence of other vascular implants and grafts: Secondary | ICD-10-CM

## 2017-04-05 DIAGNOSIS — J45909 Unspecified asthma, uncomplicated: Secondary | ICD-10-CM | POA: Diagnosis not present

## 2017-04-05 DIAGNOSIS — Z5112 Encounter for antineoplastic immunotherapy: Secondary | ICD-10-CM | POA: Diagnosis not present

## 2017-04-05 DIAGNOSIS — F419 Anxiety disorder, unspecified: Secondary | ICD-10-CM | POA: Insufficient documentation

## 2017-04-05 DIAGNOSIS — Z79899 Other long term (current) drug therapy: Secondary | ICD-10-CM | POA: Insufficient documentation

## 2017-04-05 DIAGNOSIS — I10 Essential (primary) hypertension: Secondary | ICD-10-CM | POA: Diagnosis not present

## 2017-04-05 DIAGNOSIS — C50912 Malignant neoplasm of unspecified site of left female breast: Secondary | ICD-10-CM | POA: Diagnosis present

## 2017-04-05 HISTORY — DX: Presence of other vascular implants and grafts: Z95.828

## 2017-04-05 LAB — COMPREHENSIVE METABOLIC PANEL
ALT: 73 U/L — AB (ref 0–55)
AST: 41 U/L — AB (ref 5–34)
Albumin: 3.6 g/dL (ref 3.5–5.0)
Alkaline Phosphatase: 84 U/L (ref 40–150)
Anion Gap: 8 mEq/L (ref 3–11)
BUN: 12.7 mg/dL (ref 7.0–26.0)
CALCIUM: 8.9 mg/dL (ref 8.4–10.4)
CHLORIDE: 108 meq/L (ref 98–109)
CO2: 27 meq/L (ref 22–29)
Creatinine: 1 mg/dL (ref 0.6–1.1)
EGFR: 64 mL/min/{1.73_m2} — ABNORMAL LOW (ref >90)
GLUCOSE: 113 mg/dL (ref 70–140)
Potassium: 3.7 mEq/L (ref 3.5–5.1)
SODIUM: 143 meq/L (ref 136–145)
Total Bilirubin: 0.39 mg/dL (ref 0.20–1.20)
Total Protein: 6 g/dL — ABNORMAL LOW (ref 6.4–8.3)

## 2017-04-05 LAB — CBC WITH DIFFERENTIAL/PLATELET
BASO%: 0.6 % (ref 0.0–2.0)
Basophils Absolute: 0 10*3/uL (ref 0.0–0.1)
EOS%: 4.1 % (ref 0.0–7.0)
Eosinophils Absolute: 0.1 10*3/uL (ref 0.0–0.5)
HEMATOCRIT: 33.5 % — AB (ref 34.8–46.6)
HGB: 11.5 g/dL — ABNORMAL LOW (ref 11.6–15.9)
LYMPH#: 0.8 10*3/uL — AB (ref 0.9–3.3)
LYMPH%: 31.5 % (ref 14.0–49.7)
MCH: 30.7 pg (ref 25.1–34.0)
MCHC: 34.4 g/dL (ref 31.5–36.0)
MCV: 89.4 fL (ref 79.5–101.0)
MONO#: 0.1 10*3/uL (ref 0.1–0.9)
MONO%: 5 % (ref 0.0–14.0)
NEUT#: 1.5 10*3/uL (ref 1.5–6.5)
NEUT%: 58.8 % (ref 38.4–76.8)
Platelets: 162 10*3/uL (ref 145–400)
RBC: 3.75 10*6/uL (ref 3.70–5.45)
RDW: 12.8 % (ref 11.2–14.5)
WBC: 2.5 10*3/uL — ABNORMAL LOW (ref 3.9–10.3)

## 2017-04-05 MED ORDER — FAMOTIDINE IN NACL 20-0.9 MG/50ML-% IV SOLN
INTRAVENOUS | Status: AC
  Filled 2017-04-05: qty 50

## 2017-04-05 MED ORDER — SODIUM CHLORIDE 0.9% FLUSH
10.0000 mL | Freq: Once | INTRAVENOUS | Status: AC
  Administered 2017-04-05: 10 mL
  Filled 2017-04-05: qty 10

## 2017-04-05 MED ORDER — HEPARIN SOD (PORK) LOCK FLUSH 100 UNIT/ML IV SOLN
500.0000 [IU] | Freq: Once | INTRAVENOUS | Status: AC | PRN
  Administered 2017-04-05: 500 [IU]
  Filled 2017-04-05: qty 5

## 2017-04-05 MED ORDER — PACLITAXEL CHEMO INJECTION 300 MG/50ML
80.0000 mg/m2 | Freq: Once | INTRAVENOUS | Status: AC
  Administered 2017-04-05: 150 mg via INTRAVENOUS
  Filled 2017-04-05: qty 25

## 2017-04-05 MED ORDER — SODIUM CHLORIDE 0.9% FLUSH
10.0000 mL | INTRAVENOUS | Status: DC | PRN
  Administered 2017-04-05: 10 mL
  Filled 2017-04-05: qty 10

## 2017-04-05 MED ORDER — ACETAMINOPHEN 325 MG PO TABS
650.0000 mg | ORAL_TABLET | Freq: Once | ORAL | Status: AC
  Administered 2017-04-05: 650 mg via ORAL

## 2017-04-05 MED ORDER — DIPHENHYDRAMINE HCL 50 MG/ML IJ SOLN
INTRAMUSCULAR | Status: AC
  Filled 2017-04-05: qty 1

## 2017-04-05 MED ORDER — DEXAMETHASONE SODIUM PHOSPHATE 10 MG/ML IJ SOLN
INTRAMUSCULAR | Status: AC
  Filled 2017-04-05: qty 1

## 2017-04-05 MED ORDER — SODIUM CHLORIDE 0.9 % IV SOLN
Freq: Once | INTRAVENOUS | Status: AC
  Administered 2017-04-05: 10:00:00 via INTRAVENOUS

## 2017-04-05 MED ORDER — SODIUM CHLORIDE 0.9 % IV SOLN
10.0000 mg | Freq: Once | INTRAVENOUS | Status: AC
  Administered 2017-04-05: 10 mg via INTRAVENOUS
  Filled 2017-04-05: qty 1

## 2017-04-05 MED ORDER — FAMOTIDINE IN NACL 20-0.9 MG/50ML-% IV SOLN
20.0000 mg | Freq: Once | INTRAVENOUS | Status: AC
  Administered 2017-04-05: 20 mg via INTRAVENOUS

## 2017-04-05 MED ORDER — DIPHENHYDRAMINE HCL 50 MG/ML IJ SOLN
25.0000 mg | Freq: Once | INTRAMUSCULAR | Status: AC
  Administered 2017-04-05: 25 mg via INTRAVENOUS

## 2017-04-05 MED ORDER — ACETAMINOPHEN 325 MG PO TABS
ORAL_TABLET | ORAL | Status: AC
  Filled 2017-04-05: qty 2

## 2017-04-05 MED ORDER — TRASTUZUMAB CHEMO 150 MG IV SOLR
2.0000 mg/kg | Freq: Once | INTRAVENOUS | Status: AC
  Administered 2017-04-05: 147 mg via INTRAVENOUS
  Filled 2017-04-05: qty 7

## 2017-04-05 NOTE — Patient Instructions (Signed)
[facility] Discharge Instructions for Patients Receiving Chemotherapy  Today you received the following chemotherapy agents:  Herceptin, Taxol  To help prevent nausea and vomiting after your treatment, we encourage you to take your nausea medication as prescribed.   If you develop nausea and vomiting that is not controlled by your nausea medication, call the clinic.   BELOW ARE SYMPTOMS THAT SHOULD BE REPORTED IMMEDIATELY:  *FEVER GREATER THAN 100.5 F  *CHILLS WITH OR WITHOUT FEVER  NAUSEA AND VOMITING THAT IS NOT CONTROLLED WITH YOUR NAUSEA MEDICATION  *UNUSUAL SHORTNESS OF BREATH  *UNUSUAL BRUISING OR BLEEDING  TENDERNESS IN MOUTH AND THROAT WITH OR WITHOUT PRESENCE OF ULCERS  *URINARY PROBLEMS  *BOWEL PROBLEMS  UNUSUAL RASH Items with * indicate a potential emergency and should be followed up as soon as possible.  Feel free to call the clinic you have any questions or concerns. The clinic phone number is [phone].  Please show the [facility] at check-in to the Emergency Department and triage nurse.

## 2017-04-05 NOTE — Progress Notes (Signed)
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 Lindi Adie  for enrollment into the Cardio-Oncology program.  SUMMARY OF ONCOLOGIC HISTORY:       Malignant neoplasm of lower-outer quadrant of left breast of female, estrogen receptor positive ([place])   03/01/2017 Initial Diagnosis    Left breast 2:30 position: 2.1 cm irregular solid mass, additional 0.8 cm oval mass in the left breast (fibrocystic); primary mass on biopsy revealed IDC grade 3, ER 100%, PR 80%, Ki-67 25%, HER-2 positive ratio 1.88, copy #6.2, T2 N0 stage II a clinical stage        Therapy plan 1. Neoadjuvant Taxol Herceptin weekly 12 followed by Herceptin maintenance for 6 months 2. breast conserving surgery with sentinel lymph node biopsy 3. Adjuvant radiation therapy 4. Followed by adjuvant antiestrogen therapy   Denies any h/o known heart disease (+ family history). Remains active taking care of kids at school. No CP or SOB. No HF symptoms. Tolerating chemo well   ECHO (03/18/17): 60-65%  Lat s' 9/6 -18.3% Grade II DD - Personally reviewe   Review of Systems: [y] = yes, [ ]  = no   General: Weight gain [ ] ; Weight loss [ ] ; Anorexia [ ] ; Fatigue [ ] ; Fever [ ] ; Chills [ ] ; Weakness [ ]   Cardiac: Chest pain/pressure [ ] ; Resting SOB [ ] ; Exertional SOB [ ] ; Orthopnea [ ] ; Pedal Edema [ ] ; Palpitations [ ] ; Syncope [ ] ; Presyncope [ ] ; Paroxysmal nocturnal dyspnea[ ]   Pulmonary: Cough [ ] ; Wheezing[ ] ; Hemoptysis[ ] ; Sputum [ ] ; Snoring [ ]   GI: Vomiting[ ] ; Dysphagia[ ] ; Melena[ ] ; Hematochezia [ ] ; Heartburn[ ] ; Abdominal pain [ ] ; Constipation [ ] ; Diarrhea [ ] ; BRBPR [ ]   GU: Hematuria[ ] ; Dysuria [ ] ; Nocturia[ ]   Vascular: Pain in legs with walking [ ] ; Pain in feet with lying flat [ ] ; Non-healing sores [ ] ; Stroke [ ] ; TIA [ ] ; Slurred speech  [ ] ;  Neuro: Headaches[ ] ; Vertigo[ ] ; Seizures[ ] ; Paresthesias[ ] ;Blurred vision [ ] ; Diplopia [ ] ; Vision changes [ ]   Ortho/Skin: Arthritis [ ] ; Joint pain [ ] ; Muscle pain [ ] ; Joint swelling [ ] ; Back Pain [ ] ; Rash [ ]   Psych: Depression[ ] ; Anxiety[ y]  Heme: Bleeding problems [ ] ; Clotting disorders [ ] ; Anemia [ ]   Endocrine: Diabetes [ ] ; Thyroid dysfunction[ ]    Past Medical History:  Diagnosis Date  . Anxiety   . Asthma   . Cancer [facility])    breast cancer  . Depression   . Hypertension   . Seasonal allergies     Current Outpatient Prescriptions  Medication Sig Dispense Refill  . buPROPion (WELLBUTRIN XL) 300 MG 24 hr tablet Take 300 mg by mouth daily.    . Coenzyme Q10 (COQ10) 100 MG CAPS Take 1 capsule by mouth daily.    . Cyanocobalamin (B-12) 1000 MCG SUBL Place 1 tablet under the tongue daily.    Marland Kitchen HYDROcodone-acetaminophen (NORCO/VICODIN) 5-325 MG tablet Take 1 tablet by mouth every 4 (four) hours as needed. 10 tablet 0  . lidocaine-prilocaine (EMLA) cream Apply to affected area once 30 g 3  . lisdexamfetamine (VYVANSE) 50 MG capsule Take 100 mg by mouth daily as needed (Take only when working).    Marland Kitchen losartan (COZAAR) 100 MG tablet Take 1 tablet by mouth daily.  0  . ondansetron (ZOFRAN) 8 MG tablet Take 1 tablet (8 mg total) by mouth 2 (two) times daily as needed (Nausea or vomiting). 30 tablet 1  . PARoxetine (PAXIL) 40 MG tablet Take 40 mg by mouth every morning.    . prochlorperazine (COMPAZINE) 10 MG tablet Take 1 tablet (10 mg total) by mouth every 6 (six) hours as needed (Nausea or vomiting). 30 tablet 1   No current facility-administered medications for this encounter.     Allergies  Allergen Reactions  . No Known Allergies       Social History   Social History  . Marital status: Divorced    Spouse name: N/A  . Number of children: N/A  . Years of education: N/A   Occupational History  . Not on file.   Social History Main Topics  . Smoking  status: Never Smoker  . Smokeless tobacco: Never Used  . Alcohol use No  . Drug use: Yes    Types: Marijuana     Comment: last use 03/25/17  . Sexual activity: Not Currently   Other Topics Concern  . Not on file   Social History Narrative  . No narrative on file      Family History  Problem Relation Age of Onset  . Heart disease Mother   . Hypertension Mother   . Kidney failure Father   . Diabetes Father   . Hypertension Father   . Brain cancer Maternal Grandmother   . Breast cancer Paternal Grandmother     Vitals:   04/05/17 1536  BP: (!) 152/106  Pulse: 78  SpO2: 97%  Weight: 161 lb 12.8 oz (73.4 kg)    PHYSICAL EXAM: General:  Well appearing. No respiratory difficulty HEENT: normal Neck: supple. no JVD. Carotids 2+ bilat; no bruits. No lymphadenopathy or thryomegaly appreciated. + right port-a-cath Cor: PMI nondisplaced. Regular rate & rhythm. No rubs, gallops or murmurs. Lungs: clear Abdomen: soft, nontender, nondistended. No hepatosplenomegaly. No bruits or masses. Good bowel sounds. Extremities: no cyanosis, clubbing, rash, edema Neuro: alert & oriented x 3, cranial nerves grossly intact. moves all 4 extremities w/o difficulty. Affect pleasant.   ASSESSMENT & PLAN: 1. Left breast cancer -Diagnosed 5/18 -Grade 3, ER 100%, PR 80%, Ki-67 25%, HER-2 positive ratio 1.88, copy #6.2, T2 N0 stage II a clinical stage -Therapy plan: 1. Neoadjuvant Taxol Herceptin weekly 12 followed by Herceptin maintenance for 6 months 2. breast conserving surgery with sentinel lymph node biopsy 3. Adjuvant radiation therapy 4. Followed by adjuvant antiestrogen therapy  Explained incidence of Herceptin cardiotoxicity and role of Cardio-oncology clinic at length. Echo images reviewed personally. All parameters stable. Reviewed signs and symptoms of HF to look for. Continue Herceptin. Follow-up with echo in 3 months.  2. HTN --BP elevated here but flowsheets from cancer center  reviewed and SBP 120-130 range for the most part. Will follow and adjust anti-HTN regimen as needed. Does have Grade II DD on echo with mild LVH so careful monitoring of BP will be important.  - Would add spiro as needed.   Glori Bickers, MD  12:10 AM

## 2017-04-05 NOTE — Progress Notes (Signed)
[place] Spiritual Care Note  Rounded on Haley Sanchez and her friend because her chemo chair was beside a patient who coded.  Provided emotional support to process the distress of the event.  Normalized feelings, provided pastoral reflection, addressed self-care and residual stress related to adrenaline, assisted with reframing other aspects of personal distress related to dx/tx.    --"How is this serving me?" is tool she plans to use for discerning whether a given activity or habit of mind (such as aggressive research about cancer) is adding to or subtracting from her QOL/state of mind.   --Per pt, she has noticed that she has become hyperaware of beauty all around her.  We talked about positive ways that cancer dx/tx can encourage chances in perception and perspective.  Pt verbalized gratitude for support and is aware of ongoing Spiritual Care availability for processing, reflection, and encouragement.   [place], [place], [facility] Pager [phone] Voicemail [phone]

## 2017-04-05 NOTE — Progress Notes (Deleted)
[place] Spiritual Care Note  Rounded on Haley Sanchez and her friend because her chemo chair was beside a patient who coded.  Provided emotional support to process the distress of the event.  Normalized feelings, provided pastoral reflection, addressed self-care and residual stress related to adrenaline, assisted with reframing other aspects of personal distress related to dx/tx.    --"How is this serving me?" is tool she plans to use for discerning whether a given activity or habit of mind (such as aggressive research about cancer) is adding to or subtracting from her QOL/state of mind.   --Per pt, she has noticed that she has become hyperaware of beauty all around her.  We talked about positive ways that cancer dx/tx can encourage chances in perception and perspective.  Pt verbalized gratitude for support and is aware of ongoing Spiritual Care availability for processing, reflection, and encouragement.   [place], [place], [facility] Pager [phone] Voicemail [phone]

## 2017-04-05 NOTE — Patient Instructions (Signed)
Your physician recommends that you schedule a follow-up appointment in: 3 months with echocardiogram  

## 2017-04-12 ENCOUNTER — Ambulatory Visit (HOSPITAL_BASED_OUTPATIENT_CLINIC_OR_DEPARTMENT_OTHER): Payer: BC Managed Care – PPO

## 2017-04-12 ENCOUNTER — Other Ambulatory Visit (HOSPITAL_BASED_OUTPATIENT_CLINIC_OR_DEPARTMENT_OTHER): Payer: BC Managed Care – PPO

## 2017-04-12 ENCOUNTER — Ambulatory Visit: Payer: BC Managed Care – PPO

## 2017-04-12 ENCOUNTER — Encounter: Payer: Self-pay | Admitting: Hematology and Oncology

## 2017-04-12 ENCOUNTER — Ambulatory Visit (HOSPITAL_BASED_OUTPATIENT_CLINIC_OR_DEPARTMENT_OTHER): Payer: BC Managed Care – PPO | Admitting: Hematology and Oncology

## 2017-04-12 DIAGNOSIS — Z5112 Encounter for antineoplastic immunotherapy: Secondary | ICD-10-CM

## 2017-04-12 DIAGNOSIS — C50512 Malignant neoplasm of lower-outer quadrant of left female breast: Secondary | ICD-10-CM

## 2017-04-12 DIAGNOSIS — D701 Agranulocytosis secondary to cancer chemotherapy: Secondary | ICD-10-CM

## 2017-04-12 DIAGNOSIS — Z170 Estrogen receptor positive status [ER+]: Secondary | ICD-10-CM

## 2017-04-12 DIAGNOSIS — Z5111 Encounter for antineoplastic chemotherapy: Secondary | ICD-10-CM

## 2017-04-12 LAB — CBC WITH DIFFERENTIAL/PLATELET
BASO%: 1.2 % (ref 0.0–2.0)
Basophils Absolute: 0 10*3/uL (ref 0.0–0.1)
EOS%: 3.7 % (ref 0.0–7.0)
Eosinophils Absolute: 0.1 10*3/uL (ref 0.0–0.5)
HCT: 34.6 % — ABNORMAL LOW (ref 34.8–46.6)
HGB: 11.9 g/dL (ref 11.6–15.9)
LYMPH%: 36.3 % (ref 14.0–49.7)
MCH: 31.1 pg (ref 25.1–34.0)
MCHC: 34.3 g/dL (ref 31.5–36.0)
MCV: 90.8 fL (ref 79.5–101.0)
MONO#: 0.2 10*3/uL (ref 0.1–0.9)
MONO%: 6.7 % (ref 0.0–14.0)
NEUT%: 52.1 % (ref 38.4–76.8)
NEUTROS ABS: 1.3 10*3/uL — AB (ref 1.5–6.5)
Platelets: 209 10*3/uL (ref 145–400)
RBC: 3.81 10*6/uL (ref 3.70–5.45)
RDW: 13.4 % (ref 11.2–14.5)
WBC: 2.5 10*3/uL — AB (ref 3.9–10.3)
lymph#: 0.9 10*3/uL (ref 0.9–3.3)

## 2017-04-12 LAB — COMPREHENSIVE METABOLIC PANEL
ALT: 53 U/L (ref 0–55)
AST: 15 U/L (ref 5–34)
Albumin: 3.5 g/dL (ref 3.5–5.0)
Alkaline Phosphatase: 100 U/L (ref 40–150)
Anion Gap: 10 mEq/L (ref 3–11)
BILIRUBIN TOTAL: 0.32 mg/dL (ref 0.20–1.20)
BUN: 17 mg/dL (ref 7.0–26.0)
CHLORIDE: 107 meq/L (ref 98–109)
CO2: 26 meq/L (ref 22–29)
CREATININE: 1 mg/dL (ref 0.6–1.1)
Calcium: 9 mg/dL (ref 8.4–10.4)
EGFR: 67 mL/min/{1.73_m2} — ABNORMAL LOW (ref >90)
GLUCOSE: 101 mg/dL (ref 70–140)
Potassium: 4 mEq/L (ref 3.5–5.1)
SODIUM: 143 meq/L (ref 136–145)
TOTAL PROTEIN: 6.2 g/dL — AB (ref 6.4–8.3)

## 2017-04-12 MED ORDER — SODIUM CHLORIDE 0.9 % IV SOLN
Freq: Once | INTRAVENOUS | Status: AC
  Administered 2017-04-12: 09:00:00 via INTRAVENOUS

## 2017-04-12 MED ORDER — ACETAMINOPHEN 325 MG PO TABS
ORAL_TABLET | ORAL | Status: AC
  Filled 2017-04-12: qty 2

## 2017-04-12 MED ORDER — DIPHENHYDRAMINE HCL 50 MG/ML IJ SOLN
INTRAMUSCULAR | Status: AC
  Filled 2017-04-12: qty 1

## 2017-04-12 MED ORDER — PACLITAXEL CHEMO INJECTION 300 MG/50ML
65.0000 mg/m2 | Freq: Once | INTRAVENOUS | Status: AC
  Administered 2017-04-12: 120 mg via INTRAVENOUS
  Filled 2017-04-12: qty 20

## 2017-04-12 MED ORDER — DIPHENHYDRAMINE HCL 50 MG/ML IJ SOLN
25.0000 mg | Freq: Once | INTRAMUSCULAR | Status: AC
  Administered 2017-04-12: 25 mg via INTRAVENOUS

## 2017-04-12 MED ORDER — HEPARIN SOD (PORK) LOCK FLUSH 100 UNIT/ML IV SOLN
500.0000 [IU] | Freq: Once | INTRAVENOUS | Status: AC | PRN
  Administered 2017-04-12: 500 [IU]
  Filled 2017-04-12: qty 5

## 2017-04-12 MED ORDER — FAMOTIDINE IN NACL 20-0.9 MG/50ML-% IV SOLN
INTRAVENOUS | Status: AC
Start: 2017-04-12 — End: 2017-04-12
  Filled 2017-04-12: qty 50

## 2017-04-12 MED ORDER — SODIUM CHLORIDE 0.9 % IV SOLN
10.0000 mg | Freq: Once | INTRAVENOUS | Status: AC
  Administered 2017-04-12: 10 mg via INTRAVENOUS
  Filled 2017-04-12: qty 1

## 2017-04-12 MED ORDER — SODIUM CHLORIDE 0.9% FLUSH
10.0000 mL | INTRAVENOUS | Status: DC | PRN
  Administered 2017-04-12: 10 mL
  Filled 2017-04-12: qty 10

## 2017-04-12 MED ORDER — SODIUM CHLORIDE 0.9 % IV SOLN
150.0000 mg | Freq: Once | INTRAVENOUS | Status: AC
  Administered 2017-04-12: 150 mg via INTRAVENOUS
  Filled 2017-04-12: qty 7.14

## 2017-04-12 MED ORDER — ACETAMINOPHEN 325 MG PO TABS
650.0000 mg | ORAL_TABLET | Freq: Once | ORAL | Status: AC
Start: 2017-04-12 — End: 2017-04-12
  Administered 2017-04-12: 650 mg via ORAL

## 2017-04-12 MED ORDER — FAMOTIDINE IN NACL 20-0.9 MG/50ML-% IV SOLN
20.0000 mg | Freq: Once | INTRAVENOUS | Status: AC
  Administered 2017-04-12: 20 mg via INTRAVENOUS

## 2017-04-12 NOTE — Patient Instructions (Signed)
[facility] Discharge Instructions for Patients Receiving Chemotherapy  Today you received the following chemotherapy agents Herceptin and Taxol  To help prevent nausea and vomiting after your treatment, we encourage you to take your nausea medication as directed   If you develop nausea and vomiting that is not controlled by your nausea medication, call the clinic.   BELOW ARE SYMPTOMS THAT SHOULD BE REPORTED IMMEDIATELY:  *FEVER GREATER THAN 100.5 F  *CHILLS WITH OR WITHOUT FEVER  NAUSEA AND VOMITING THAT IS NOT CONTROLLED WITH YOUR NAUSEA MEDICATION  *UNUSUAL SHORTNESS OF BREATH  *UNUSUAL BRUISING OR BLEEDING  TENDERNESS IN MOUTH AND THROAT WITH OR WITHOUT PRESENCE OF ULCERS  *URINARY PROBLEMS  *BOWEL PROBLEMS  UNUSUAL RASH Items with * indicate a potential emergency and should be followed up as soon as possible.  Feel free to call the clinic you have any questions or concerns. The clinic phone number is [phone].  Please show the [place] at check-in to the Emergency Department and triage nurse.

## 2017-04-12 NOTE — Progress Notes (Signed)
Nloted ANC 1.3. Dr. Lindi Adie addressed neutropenia in today's office. Will continue with tx today with adjusted Taxol.

## 2017-04-12 NOTE — Assessment & Plan Note (Signed)
Left breast 2:30 position: 2.1 cm irregular solid mass, additional 0.8 cm oval mass in the left breast (fibrocystic); primary mass on biopsy revealed IDC grade 3, ER 100%, PR 80%, Ki-67 25%, HER-2 positive ratio 1.88, copy #6.2, T2 N0 stage II a clinical stage  Treatment plan: 1. Neoadjuvant Taxol Herceptin weekly 12 followed by Herceptin maintenance for 6 months 2. breast conserving surgery with sentinel lymph node biopsy 3. Adjuvant radiation therapy 4. Followed by adjuvant antiestrogen therapy ----------------------------------------------------------------------------------------------------------------------------------------- Current treatment: Taxol Herceptin cycle 3 Chemotherapy toxicities:  Return to clinic in 2 weeks for follow-up

## 2017-04-12 NOTE — Patient Instructions (Signed)
Implanted Port Home Guide An implanted port is a type of central line that is placed under the skin. Central lines are used to provide IV access when treatment or nutrition needs to be given through a person's veins. Implanted ports are used for long-term IV access. An implanted port may be placed because:  You need IV medicine that would be irritating to the small veins in your hands or arms.  You need long-term IV medicines, such as antibiotics.  You need IV nutrition for a long period.  You need frequent blood draws for lab tests.  You need dialysis.  Implanted ports are usually placed in the chest area, but they can also be placed in the upper arm, the abdomen, or the leg. An implanted port has two main parts:  Reservoir. The reservoir is round and will appear as a small, raised area under your skin. The reservoir is the part where a needle is inserted to give medicines or draw blood.  Catheter. The catheter is a thin, flexible tube that extends from the reservoir. The catheter is placed into a large vein. Medicine that is inserted into the reservoir goes into the catheter and then into the vein.  How will I care for my incision site? Do not get the incision site wet. Bathe or shower as directed by your health care provider. How is my port accessed? Special steps must be taken to access the port:  Before the port is accessed, a numbing cream can be placed on the skin. This helps numb the skin over the port site.  Your health care provider uses a sterile technique to access the port. ? Your health care provider must put on a mask and sterile gloves. ? The skin over your port is cleaned carefully with an antiseptic and allowed to dry. ? The port is gently pinched between sterile gloves, and a needle is inserted into the port.  Only "non-coring" port needles should be used to access the port. Once the port is accessed, a blood return should be checked. This helps ensure that the port  is in the vein and is not clogged.  If your port needs to remain accessed for a constant infusion, a clear (transparent) bandage will be placed over the needle site. The bandage and needle will need to be changed every week, or as directed by your health care provider.  Keep the bandage covering the needle clean and dry. Do not get it wet. Follow your health care provider's instructions on how to take a shower or bath while the port is accessed.  If your port does not need to stay accessed, no bandage is needed over the port.  What is flushing? Flushing helps keep the port from getting clogged. Follow your health care provider's instructions on how and when to flush the port. Ports are usually flushed with saline solution or a medicine called heparin. The need for flushing will depend on how the port is used.  If the port is used for intermittent medicines or blood draws, the port will need to be flushed: ? After medicines have been given. ? After blood has been drawn. ? As part of routine maintenance.  If a constant infusion is running, the port may not need to be flushed.  How long will my port stay implanted? The port can stay in for as long as your health care provider thinks it is needed. When it is time for the port to come out, surgery will be   done to remove it. The procedure is similar to the one performed when the port was put in. When should I seek immediate medical care? When you have an implanted port, you should seek immediate medical care if:  You notice a bad smell coming from the incision site.  You have swelling, redness, or drainage at the incision site.  You have more swelling or pain at the port site or the surrounding area.  You have a fever that is not controlled with medicine.  This information is not intended to replace advice given to you by your health care provider. Make sure you discuss any questions you have with your health care provider. Document  Released: 09/27/2005 Document Revised: 03/04/2016 Document Reviewed: 06/04/2013 Elsevier Interactive Patient Education  2017 Elsevier Inc.  

## 2017-04-12 NOTE — Progress Notes (Signed)
Patient Care Team: Orpah Melter, MD as PCP - General (Family Medicine) Excell Seltzer, MD as Consulting Physician (General Surgery) Magrinat, Virgie Dad, MD as Consulting Physician (Oncology) Eppie Gibson, MD as Attending Physician (Radiation Oncology) Huel Cote, NP as Nurse Practitioner (Obstetrics and Gynecology)  DIAGNOSIS:  Encounter Diagnosis  Name Primary?  . Malignant neoplasm of lower-outer quadrant of left breast of female, estrogen receptor positive ([place])     SUMMARY OF ONCOLOGIC HISTORY:   Malignant neoplasm of lower-outer quadrant of left breast of female, estrogen receptor positive ([place])   03/01/2017 Initial Diagnosis    Left breast 2:30 position: 2.1 cm irregular solid mass, additional 0.8 cm oval mass in the left breast (fibrocystic); primary mass on biopsy revealed IDC grade 3, ER 100%, PR 80%, Ki-67 25%, HER-2 positive ratio 1.88, copy #6.2, T2 N0 stage II a clinical stage      03/29/2017 -  Neo-Adjuvant Chemotherapy    Neoadjuvant chemotherapy with Taxol Herceptin       CHIEF COMPLIANT: Mild nausea, wheezing from asthma  INTERVAL HISTORY: Haley Sanchez is a 56 year old with above-mentioned history of left breast cancer currently on neoadjuvant chemotherapy with Taxol Herceptin. Overall she tolerated the treatment fairly well. She did have mild nausea but she does not want to take antinausea medications. She also felt some chest tightness however she also has wheezing from asthma.  REVIEW OF SYSTEMS:   Constitutional: Denies fevers, chills or abnormal weight loss Eyes: Denies blurriness of vision Ears, nose, mouth, throat, and face: Denies mucositis or sore throat Respiratory: Wheezing from asthma Cardiovascular: Denies palpitation, complains of mild chest tightness Gastrointestinal:  Denies nausea, heartburn or change in bowel habits Skin: Denies abnormal skin rashes Lymphatics: Denies new lymphadenopathy or easy bruising Neurological:Denies  numbness, tingling or new weaknesses Behavioral/Psych: Mood is stable, no new changes  Extremities: No lower extremity edema  All other systems were reviewed with the patient and are negative.  I have reviewed the past medical history, past surgical history, social history and family history with the patient and they are unchanged from previous note.  ALLERGIES:  is allergic to no known allergies.  MEDICATIONS:  Current Outpatient Prescriptions  Medication Sig Dispense Refill  . buPROPion (WELLBUTRIN XL) 300 MG 24 hr tablet Take 300 mg by mouth daily.    . Coenzyme Q10 (COQ10) 100 MG CAPS Take 1 capsule by mouth daily.    . Cyanocobalamin (B-12) 1000 MCG SUBL Place 1 tablet under the tongue daily.    Marland Kitchen HYDROcodone-acetaminophen (NORCO/VICODIN) 5-325 MG tablet Take 1 tablet by mouth every 4 (four) hours as needed. 10 tablet 0  . lidocaine-prilocaine (EMLA) cream Apply to affected area once 30 g 3  . lisdexamfetamine (VYVANSE) 50 MG capsule Take 100 mg by mouth daily as needed (Take only when working).    Marland Kitchen losartan (COZAAR) 100 MG tablet Take 1 tablet by mouth daily.  0  . ondansetron (ZOFRAN) 8 MG tablet Take 1 tablet (8 mg total) by mouth 2 (two) times daily as needed (Nausea or vomiting). 30 tablet 1  . PARoxetine (PAXIL) 40 MG tablet Take 40 mg by mouth every morning.    . prochlorperazine (COMPAZINE) 10 MG tablet Take 1 tablet (10 mg total) by mouth every 6 (six) hours as needed (Nausea or vomiting). 30 tablet 1   No current facility-administered medications for this visit.     PHYSICAL EXAMINATION: ECOG PERFORMANCE STATUS: 1 - Symptomatic but completely ambulatory  Vitals:   04/12/17 2947  BP: (!) 150/73  Pulse: 73  Resp: 18  Temp: 98.4 F (36.9 C)   Filed Weights   04/12/17 0812  Weight: 164 lb 12.8 oz (74.8 kg)    GENERAL:alert, no distress and comfortable SKIN: skin color, texture, turgor are normal, no rashes or significant lesions EYES: normal, Conjunctiva are  pink and non-injected, sclera clear OROPHARYNX:no exudate, no erythema and lips, buccal mucosa, and tongue normal  NECK: supple, thyroid normal size, non-tender, without nodularity LYMPH:  no palpable lymphadenopathy in the cervical, axillary or inguinal LUNGS: Mild wheezing HEART: regular rate & rhythm and no murmurs and no lower extremity edema ABDOMEN:abdomen soft, non-tender and normal bowel sounds MUSCULOSKELETAL:no cyanosis of digits and no clubbing  NEURO: alert & oriented x 3 with fluent speech, no focal motor/sensory deficits EXTREMITIES: No lower extremity edema   LABORATORY DATA:  I have reviewed the data as listed   Chemistry      Component Value Date/Time   NA 143 04/05/2017 0831   K 3.7 04/05/2017 0831   CO2 27 04/05/2017 0831   BUN 12.7 04/05/2017 0831   CREATININE 1.0 04/05/2017 0831      Component Value Date/Time   CALCIUM 8.9 04/05/2017 0831   ALKPHOS 84 04/05/2017 0831   AST 41 (H) 04/05/2017 0831   ALT 73 (H) 04/05/2017 0831   BILITOT 0.39 04/05/2017 0831       Lab Results  Component Value Date   WBC 2.5 (L) 04/05/2017   HGB 11.5 (L) 04/05/2017   HCT 33.5 (L) 04/05/2017   MCV 89.4 04/05/2017   PLT 162 04/05/2017   NEUTROABS 1.5 04/05/2017    ASSESSMENT & PLAN:  Malignant neoplasm of lower-outer quadrant of left breast of female, estrogen receptor positive ([facility]) Left breast 2:30 position: 2.1 cm irregular solid mass, additional 0.8 cm oval mass in the left breast (fibrocystic); primary mass on biopsy revealed IDC grade 3, ER 100%, PR 80%, Ki-67 25%, HER-2 positive ratio 1.88, copy #6.2, T2 N0 stage II a clinical stage  Treatment plan: 1. Neoadjuvant Taxol Herceptin weekly 12 followed by Herceptin maintenance for 6 months 2. breast conserving surgery with sentinel lymph node biopsy 3. Adjuvant radiation therapy 4. Followed by adjuvant antiestrogen  therapy ----------------------------------------------------------------------------------------------------------------------------------------- Current treatment: Taxol Herceptin cycle 3 Chemotherapy toxicities: 1 Mild nausea 2. decreasing neutrophil count ANC 1.3 today will decrease the dosage of Taxol.  Return to clinic in 1 weeks for follow-up. If her counts remain low, we may have to give her dose of Neupogen the day before chemotherapy  I spent 25 minutes talking to the patient of which more than half was spent in counseling and coordination of care.  No orders of the defined types were placed in this encounter.  The patient has a good understanding of the overall plan. she agrees with it. she will call with any problems that may develop before the next visit here.   Rulon Eisenmenger, MD 04/12/17

## 2017-04-14 ENCOUNTER — Encounter: Payer: Self-pay | Admitting: Women's Health

## 2017-04-14 ENCOUNTER — Encounter (HOSPITAL_COMMUNITY): Payer: Self-pay | Admitting: General Surgery

## 2017-04-16 ENCOUNTER — Encounter: Payer: Self-pay | Admitting: Hematology and Oncology

## 2017-04-18 ENCOUNTER — Telehealth: Payer: Self-pay

## 2017-04-18 ENCOUNTER — Encounter: Payer: Self-pay | Admitting: Women's Health

## 2017-04-18 ENCOUNTER — Other Ambulatory Visit: Payer: Self-pay

## 2017-04-18 ENCOUNTER — Other Ambulatory Visit (HOSPITAL_BASED_OUTPATIENT_CLINIC_OR_DEPARTMENT_OTHER): Payer: BC Managed Care – PPO

## 2017-04-18 DIAGNOSIS — C50512 Malignant neoplasm of lower-outer quadrant of left female breast: Secondary | ICD-10-CM

## 2017-04-18 DIAGNOSIS — Z170 Estrogen receptor positive status [ER+]: Principal | ICD-10-CM

## 2017-04-18 LAB — CBC WITH DIFFERENTIAL/PLATELET
BASO%: 0.6 % (ref 0.0–2.0)
BASOS ABS: 0 10*3/uL (ref 0.0–0.1)
EOS%: 2.5 % (ref 0.0–7.0)
Eosinophils Absolute: 0.1 10*3/uL (ref 0.0–0.5)
HEMATOCRIT: 34.3 % — AB (ref 34.8–46.6)
HGB: 11.4 g/dL — ABNORMAL LOW (ref 11.6–15.9)
LYMPH%: 39.6 % (ref 14.0–49.7)
MCH: 30.8 pg (ref 25.1–34.0)
MCHC: 33.2 g/dL (ref 31.5–36.0)
MCV: 92.7 fL (ref 79.5–101.0)
MONO#: 0.1 10*3/uL (ref 0.1–0.9)
MONO%: 2.5 % (ref 0.0–14.0)
NEUT#: 1.8 10*3/uL (ref 1.5–6.5)
NEUT%: 54.8 % (ref 38.4–76.8)
Platelets: 245 10*3/uL (ref 145–400)
RBC: 3.7 10*6/uL (ref 3.70–5.45)
RDW: 13.7 % (ref 11.2–14.5)
WBC: 3.3 10*3/uL — ABNORMAL LOW (ref 3.9–10.3)
lymph#: 1.3 10*3/uL (ref 0.9–3.3)

## 2017-04-18 LAB — COMPREHENSIVE METABOLIC PANEL
ALT: 59 U/L — AB (ref 0–55)
AST: 16 U/L (ref 5–34)
Albumin: 3.6 g/dL (ref 3.5–5.0)
Alkaline Phosphatase: 108 U/L (ref 40–150)
Anion Gap: 4 mEq/L (ref 3–11)
BUN: 16.3 mg/dL (ref 7.0–26.0)
CALCIUM: 8.7 mg/dL (ref 8.4–10.4)
CHLORIDE: 105 meq/L (ref 98–109)
CO2: 29 meq/L (ref 22–29)
Creatinine: 1 mg/dL (ref 0.6–1.1)
EGFR: 61 mL/min/{1.73_m2} — ABNORMAL LOW (ref >90)
Glucose: 75 mg/dl (ref 70–140)
POTASSIUM: 4 meq/L (ref 3.5–5.1)
SODIUM: 138 meq/L (ref 136–145)
Total Bilirubin: 0.24 mg/dL (ref 0.20–1.20)
Total Protein: 6.2 g/dL — ABNORMAL LOW (ref 6.4–8.3)

## 2017-04-18 NOTE — Telephone Encounter (Signed)
Called pt to respond to her email regarding getting her labs rechecked today in order to determine if she will need neupogen due to decreased [place]. Pt scheduled to come in this afternoon for labs. Will update when lab resulted.

## 2017-04-19 ENCOUNTER — Ambulatory Visit: Payer: BC Managed Care – PPO

## 2017-04-19 ENCOUNTER — Encounter: Payer: Self-pay | Admitting: Hematology and Oncology

## 2017-04-19 ENCOUNTER — Other Ambulatory Visit (HOSPITAL_BASED_OUTPATIENT_CLINIC_OR_DEPARTMENT_OTHER): Payer: BC Managed Care – PPO

## 2017-04-19 ENCOUNTER — Ambulatory Visit (HOSPITAL_BASED_OUTPATIENT_CLINIC_OR_DEPARTMENT_OTHER): Payer: BC Managed Care – PPO

## 2017-04-19 ENCOUNTER — Ambulatory Visit (HOSPITAL_BASED_OUTPATIENT_CLINIC_OR_DEPARTMENT_OTHER): Payer: BC Managed Care – PPO | Admitting: Hematology and Oncology

## 2017-04-19 DIAGNOSIS — Z170 Estrogen receptor positive status [ER+]: Secondary | ICD-10-CM

## 2017-04-19 DIAGNOSIS — Z95828 Presence of other vascular implants and grafts: Secondary | ICD-10-CM

## 2017-04-19 DIAGNOSIS — Z5112 Encounter for antineoplastic immunotherapy: Secondary | ICD-10-CM | POA: Diagnosis not present

## 2017-04-19 DIAGNOSIS — C50512 Malignant neoplasm of lower-outer quadrant of left female breast: Secondary | ICD-10-CM

## 2017-04-19 DIAGNOSIS — Z5111 Encounter for antineoplastic chemotherapy: Secondary | ICD-10-CM

## 2017-04-19 LAB — CBC WITH DIFFERENTIAL/PLATELET
BASO%: 1 % (ref 0.0–2.0)
BASOS ABS: 0 10*3/uL (ref 0.0–0.1)
EOS%: 3.5 % (ref 0.0–7.0)
Eosinophils Absolute: 0.1 10*3/uL (ref 0.0–0.5)
HCT: 35.5 % (ref 34.8–46.6)
HEMOGLOBIN: 11.7 g/dL (ref 11.6–15.9)
LYMPH%: 30.8 % (ref 14.0–49.7)
MCH: 30.5 pg (ref 25.1–34.0)
MCHC: 33 g/dL (ref 31.5–36.0)
MCV: 92.4 fL (ref 79.5–101.0)
MONO#: 0.2 10*3/uL (ref 0.1–0.9)
MONO%: 6.4 % (ref 0.0–14.0)
NEUT#: 1.8 10*3/uL (ref 1.5–6.5)
NEUT%: 58.3 % (ref 38.4–76.8)
Platelets: 256 10*3/uL (ref 145–400)
RBC: 3.84 10*6/uL (ref 3.70–5.45)
RDW: 13.8 % (ref 11.2–14.5)
WBC: 3.1 10*3/uL — ABNORMAL LOW (ref 3.9–10.3)
lymph#: 1 10*3/uL (ref 0.9–3.3)

## 2017-04-19 MED ORDER — DEXAMETHASONE SODIUM PHOSPHATE 100 MG/10ML IJ SOLN
10.0000 mg | Freq: Once | INTRAMUSCULAR | Status: AC
  Administered 2017-04-19: 10 mg via INTRAVENOUS
  Filled 2017-04-19: qty 1

## 2017-04-19 MED ORDER — DEXAMETHASONE SODIUM PHOSPHATE 10 MG/ML IJ SOLN
INTRAMUSCULAR | Status: AC
  Filled 2017-04-19: qty 1

## 2017-04-19 MED ORDER — HEPARIN SOD (PORK) LOCK FLUSH 100 UNIT/ML IV SOLN
500.0000 [IU] | Freq: Once | INTRAVENOUS | Status: AC | PRN
  Administered 2017-04-19: 500 [IU]
  Filled 2017-04-19: qty 5

## 2017-04-19 MED ORDER — FAMOTIDINE IN NACL 20-0.9 MG/50ML-% IV SOLN
20.0000 mg | Freq: Once | INTRAVENOUS | Status: AC
  Administered 2017-04-19: 20 mg via INTRAVENOUS

## 2017-04-19 MED ORDER — PACLITAXEL CHEMO INJECTION 300 MG/50ML
65.0000 mg/m2 | Freq: Once | INTRAVENOUS | Status: AC
  Administered 2017-04-19: 120 mg via INTRAVENOUS
  Filled 2017-04-19: qty 20

## 2017-04-19 MED ORDER — FAMOTIDINE IN NACL 20-0.9 MG/50ML-% IV SOLN
INTRAVENOUS | Status: AC
  Filled 2017-04-19: qty 50

## 2017-04-19 MED ORDER — DIPHENHYDRAMINE HCL 50 MG/ML IJ SOLN
25.0000 mg | Freq: Once | INTRAMUSCULAR | Status: AC
  Administered 2017-04-19: 25 mg via INTRAVENOUS

## 2017-04-19 MED ORDER — TRASTUZUMAB CHEMO 150 MG IV SOLR
150.0000 mg | Freq: Once | INTRAVENOUS | Status: AC
  Administered 2017-04-19: 150 mg via INTRAVENOUS
  Filled 2017-04-19: qty 7.14

## 2017-04-19 MED ORDER — ACETAMINOPHEN 325 MG PO TABS
ORAL_TABLET | ORAL | Status: AC
  Filled 2017-04-19: qty 2

## 2017-04-19 MED ORDER — DIPHENHYDRAMINE HCL 25 MG PO CAPS
ORAL_CAPSULE | ORAL | Status: AC
  Filled 2017-04-19: qty 1

## 2017-04-19 MED ORDER — ACETAMINOPHEN 325 MG PO TABS
650.0000 mg | ORAL_TABLET | Freq: Once | ORAL | Status: AC
  Administered 2017-04-19: 650 mg via ORAL

## 2017-04-19 MED ORDER — SODIUM CHLORIDE 0.9% FLUSH
10.0000 mL | INTRAVENOUS | Status: DC | PRN
  Administered 2017-04-19: 10 mL
  Filled 2017-04-19: qty 10

## 2017-04-19 MED ORDER — SODIUM CHLORIDE 0.9 % IV SOLN
Freq: Once | INTRAVENOUS | Status: AC
  Administered 2017-04-19: 10:00:00 via INTRAVENOUS

## 2017-04-19 MED ORDER — DIPHENHYDRAMINE HCL 50 MG/ML IJ SOLN
INTRAMUSCULAR | Status: AC
  Filled 2017-04-19: qty 1

## 2017-04-19 MED ORDER — SODIUM CHLORIDE 0.9% FLUSH
10.0000 mL | Freq: Once | INTRAVENOUS | Status: AC
  Administered 2017-04-19: 10 mL
  Filled 2017-04-19: qty 10

## 2017-04-19 NOTE — Patient Instructions (Signed)
Implanted Port Home Guide An implanted port is a type of central line that is placed under the skin. Central lines are used to provide IV access when treatment or nutrition needs to be given through a person's veins. Implanted ports are used for long-term IV access. An implanted port may be placed because:  You need IV medicine that would be irritating to the small veins in your hands or arms.  You need long-term IV medicines, such as antibiotics.  You need IV nutrition for a long period.  You need frequent blood draws for lab tests.  You need dialysis.  Implanted ports are usually placed in the chest area, but they can also be placed in the upper arm, the abdomen, or the leg. An implanted port has two main parts:  Reservoir. The reservoir is round and will appear as a small, raised area under your skin. The reservoir is the part where a needle is inserted to give medicines or draw blood.  Catheter. The catheter is a thin, flexible tube that extends from the reservoir. The catheter is placed into a large vein. Medicine that is inserted into the reservoir goes into the catheter and then into the vein.  How will I care for my incision site? Do not get the incision site wet. Bathe or shower as directed by your health care provider. How is my port accessed? Special steps must be taken to access the port:  Before the port is accessed, a numbing cream can be placed on the skin. This helps numb the skin over the port site.  Your health care provider uses a sterile technique to access the port. ? Your health care provider must put on a mask and sterile gloves. ? The skin over your port is cleaned carefully with an antiseptic and allowed to dry. ? The port is gently pinched between sterile gloves, and a needle is inserted into the port.  Only "non-coring" port needles should be used to access the port. Once the port is accessed, a blood return should be checked. This helps ensure that the port  is in the vein and is not clogged.  If your port needs to remain accessed for a constant infusion, a clear (transparent) bandage will be placed over the needle site. The bandage and needle will need to be changed every week, or as directed by your health care provider.  Keep the bandage covering the needle clean and dry. Do not get it wet. Follow your health care provider's instructions on how to take a shower or bath while the port is accessed.  If your port does not need to stay accessed, no bandage is needed over the port.  What is flushing? Flushing helps keep the port from getting clogged. Follow your health care provider's instructions on how and when to flush the port. Ports are usually flushed with saline solution or a medicine called heparin. The need for flushing will depend on how the port is used.  If the port is used for intermittent medicines or blood draws, the port will need to be flushed: ? After medicines have been given. ? After blood has been drawn. ? As part of routine maintenance.  If a constant infusion is running, the port may not need to be flushed.  How long will my port stay implanted? The port can stay in for as long as your health care provider thinks it is needed. When it is time for the port to come out, surgery will be   done to remove it. The procedure is similar to the one performed when the port was put in. When should I seek immediate medical care? When you have an implanted port, you should seek immediate medical care if:  You notice a bad smell coming from the incision site.  You have swelling, redness, or drainage at the incision site.  You have more swelling or pain at the port site or the surrounding area.  You have a fever that is not controlled with medicine.  This information is not intended to replace advice given to you by your health care provider. Make sure you discuss any questions you have with your health care provider. Document  Released: 09/27/2005 Document Revised: 03/04/2016 Document Reviewed: 06/04/2013 Elsevier Interactive Patient Education  2017 Elsevier Inc.  

## 2017-04-19 NOTE — Patient Instructions (Signed)
[facility] Discharge Instructions for Patients Receiving Chemotherapy  Today you received the following chemotherapy agents Herceptin and Taxol  To help prevent nausea and vomiting after your treatment, we encourage you to take your nausea medication as directed   If you develop nausea and vomiting that is not controlled by your nausea medication, call the clinic.   BELOW ARE SYMPTOMS THAT SHOULD BE REPORTED IMMEDIATELY:  *FEVER GREATER THAN 100.5 F  *CHILLS WITH OR WITHOUT FEVER  NAUSEA AND VOMITING THAT IS NOT CONTROLLED WITH YOUR NAUSEA MEDICATION  *UNUSUAL SHORTNESS OF BREATH  *UNUSUAL BRUISING OR BLEEDING  TENDERNESS IN MOUTH AND THROAT WITH OR WITHOUT PRESENCE OF ULCERS  *URINARY PROBLEMS  *BOWEL PROBLEMS  UNUSUAL RASH Items with * indicate a potential emergency and should be followed up as soon as possible.  Feel free to call the clinic you have any questions or concerns. The clinic phone number is [phone].  Please show the [place] at check-in to the Emergency Department and triage nurse.

## 2017-04-19 NOTE — Assessment & Plan Note (Signed)
Left breast 2:30 position: 2.1 cm irregular solid mass, additional 0.8 cm oval mass in the left breast (fibrocystic); primary mass on biopsy revealed IDC grade 3, ER 100%, PR 80%, Ki-67 25%, HER-2 positive ratio 1.88, copy #6.2, T2 N0 stage II a clinical stage  Treatment plan: 1. Neoadjuvant Taxol Herceptin weekly 12 followed by Herceptin maintenance for 6 months 2. breast conserving surgery with sentinel lymph node biopsy 3. Adjuvant radiation therapy 4. Followed by adjuvant antiestrogen therapy ----------------------------------------------------------------------------------------------------------------------------------------- Current treatment: Taxol Herceptin cycle 4 Chemotherapy toxicities: 1 Mild nausea 2. decreasing neutrophil count ANC 1.3 decreased the dosage of Taxol.   Return to clinic in 2 weeks for follow-up.  If her counts remain low, we may have to give her dose of Neupogen the day before chemotherapy

## 2017-04-19 NOTE — Progress Notes (Signed)
Patient Care Team: Orpah Melter, MD as PCP - General (Family Medicine) Excell Seltzer, MD as Consulting Physician (General Surgery) Magrinat, Virgie Dad, MD as Consulting Physician (Oncology) Eppie Gibson, MD as Attending Physician (Radiation Oncology) Huel Cote, NP as Nurse Practitioner (Obstetrics and Gynecology)  DIAGNOSIS:  Encounter Diagnosis  Name Primary?  . Malignant neoplasm of lower-outer quadrant of left breast of female, estrogen receptor positive ([facility])     SUMMARY OF ONCOLOGIC HISTORY:   Malignant neoplasm of lower-outer quadrant of left breast of female, estrogen receptor positive ([place])   03/01/2017 Initial Diagnosis    Left breast 2:30 position: 2.1 cm irregular solid mass, additional 0.8 cm oval mass in the left breast (fibrocystic); primary mass on biopsy revealed IDC grade 3, ER 100%, PR 80%, Ki-67 25%, HER-2 positive ratio 1.88, copy #6.2, T2 N0 stage II a clinical stage      03/29/2017 -  Neo-Adjuvant Chemotherapy    Neoadjuvant chemotherapy with Taxol Herceptin       CHIEF COMPLIANT: Cycle 4 Taxol Herceptin  INTERVAL HISTORY: Haley Sanchez is a 56 year old with above-mentioned history left breast cancer currently on neoadjuvant chemotherapy with Taxol and Herceptin. Overall she is tolerating chemotherapy fairly well. She denies any nausea vomiting. She did have a decrease in the white blood cell count and be required adjustment of the dosage of her chemotherapy. She denies any fevers or chills.  REVIEW OF SYSTEMS:   Constitutional: Denies fevers, chills or abnormal weight loss Eyes: Denies blurriness of vision Ears, nose, mouth, throat, and face: Denies mucositis or sore throat Respiratory: Denies cough, dyspnea or wheezes Cardiovascular: Denies palpitation, chest discomfort Gastrointestinal:  Denies nausea, heartburn or change in bowel habits Skin: Denies abnormal skin rashes Lymphatics: Denies new lymphadenopathy or easy  bruising Neurological:Denies numbness, tingling or new weaknesses Behavioral/Psych: Mood is stable, no new changes  Extremities: No lower extremity edema All other systems were reviewed with the patient and are negative.  I have reviewed the past medical history, past surgical history, social history and family history with the patient and they are unchanged from previous note.  ALLERGIES:  is allergic to no known allergies.  MEDICATIONS:  Current Outpatient Prescriptions  Medication Sig Dispense Refill  . buPROPion (WELLBUTRIN XL) 300 MG 24 hr tablet Take 300 mg by mouth daily.    . Coenzyme Q10 (COQ10) 100 MG CAPS Take 1 capsule by mouth daily.    . Cyanocobalamin (B-12) 1000 MCG SUBL Place 1 tablet under the tongue daily.    Marland Kitchen HYDROcodone-acetaminophen (NORCO/VICODIN) 5-325 MG tablet Take 1 tablet by mouth every 4 (four) hours as needed. 10 tablet 0  . lidocaine-prilocaine (EMLA) cream Apply to affected area once 30 g 3  . lisdexamfetamine (VYVANSE) 50 MG capsule Take 100 mg by mouth daily as needed (Take only when working).    Marland Kitchen losartan (COZAAR) 100 MG tablet Take 1 tablet by mouth daily.  0  . ondansetron (ZOFRAN) 8 MG tablet Take 1 tablet (8 mg total) by mouth 2 (two) times daily as needed (Nausea or vomiting). 30 tablet 1  . PARoxetine (PAXIL) 40 MG tablet Take 40 mg by mouth every morning.    . prochlorperazine (COMPAZINE) 10 MG tablet Take 1 tablet (10 mg total) by mouth every 6 (six) hours as needed (Nausea or vomiting). 30 tablet 1   No current facility-administered medications for this visit.     PHYSICAL EXAMINATION: ECOG PERFORMANCE STATUS: 1 - Symptomatic but completely ambulatory  Vitals:   04/19/17  0929  BP: 135/85  Pulse: 72  Resp: 17  Temp: 98.1 F (36.7 C)   Filed Weights   04/19/17 0929  Weight: 166 lb 6.4 oz (75.5 kg)    GENERAL:alert, no distress and comfortable SKIN: skin color, texture, turgor are normal, no rashes or significant lesions EYES:  normal, Conjunctiva are pink and non-injected, sclera clear OROPHARYNX:no exudate, no erythema and lips, buccal mucosa, and tongue normal  NECK: supple, thyroid normal size, non-tender, without nodularity LYMPH:  no palpable lymphadenopathy in the cervical, axillary or inguinal LUNGS: clear to auscultation and percussion with normal breathing effort HEART: regular rate & rhythm and no murmurs and no lower extremity edema ABDOMEN:abdomen soft, non-tender and normal bowel sounds MUSCULOSKELETAL:no cyanosis of digits and no clubbing  NEURO: alert & oriented x 3 with fluent speech, no focal motor/sensory deficits EXTREMITIES: No lower extremity edema  LABORATORY DATA:  I have reviewed the data as listed   Chemistry      Component Value Date/Time   NA 138 04/18/2017 1422   K 4.0 04/18/2017 1422   CO2 29 04/18/2017 1422   BUN 16.3 04/18/2017 1422   CREATININE 1.0 04/18/2017 1422      Component Value Date/Time   CALCIUM 8.7 04/18/2017 1422   ALKPHOS 108 04/18/2017 1422   AST 16 04/18/2017 1422   ALT 59 (H) 04/18/2017 1422   BILITOT 0.24 04/18/2017 1422       Lab Results  Component Value Date   WBC 3.1 (L) 04/19/2017   HGB 11.7 04/19/2017   HCT 35.5 04/19/2017   MCV 92.4 04/19/2017   PLT 256 04/19/2017   NEUTROABS 1.8 04/19/2017    ASSESSMENT & PLAN:  Malignant neoplasm of lower-outer quadrant of left breast of female, estrogen receptor positive ([facility]) Left breast 2:30 position: 2.1 cm irregular solid mass, additional 0.8 cm oval mass in the left breast (fibrocystic); primary mass on biopsy revealed IDC grade 3, ER 100%, PR 80%, Ki-67 25%, HER-2 positive ratio 1.88, copy #6.2, T2 N0 stage II a clinical stage  Treatment plan: 1. Neoadjuvant Taxol Herceptin weekly 12 followed by Herceptin maintenance for 6 months 2. breast conserving surgery with sentinel lymph node biopsy 3. Adjuvant radiation therapy 4. Followed by adjuvant antiestrogen  therapy ----------------------------------------------------------------------------------------------------------------------------------------- Current treatment: Taxol Herceptin cycle 4 Chemotherapy toxicities: 1 Mild nausea 2. decreased neutrophil count ANC 1.3 decreased the dosage of Taxol. Today's ANC is 1.8. Continue with the same dosage. 3. Mouth sores: Encouraged her to use salt water gargles. If it does not get better we can call in for Magic mouthwash. 4. Elevated AST: AST levels are stable at 59.  Continue with the same dosage of chemotherapy. We do not need to do her Neupogen injections. Return to clinic in 1 weeks for follow-up.  If her counts remain low, we may have to give her dose of Neupogen the day before chemotherapy  I spent 25 minutes talking to the patient of which more than half was spent in counseling and coordination of care.  No orders of the defined types were placed in this encounter.  The patient has a good understanding of the overall plan. she agrees with it. she will call with any problems that may develop before the next visit here.   Rulon Eisenmenger, MD 04/19/17

## 2017-04-20 ENCOUNTER — Other Ambulatory Visit: Payer: BC Managed Care – PPO

## 2017-04-20 ENCOUNTER — Ambulatory Visit (HOSPITAL_BASED_OUTPATIENT_CLINIC_OR_DEPARTMENT_OTHER): Payer: BC Managed Care – PPO | Admitting: Genetic Counselor

## 2017-04-20 ENCOUNTER — Encounter: Payer: Self-pay | Admitting: Genetic Counselor

## 2017-04-20 DIAGNOSIS — Z7183 Encounter for nonprocreative genetic counseling: Secondary | ICD-10-CM

## 2017-04-20 DIAGNOSIS — Z8042 Family history of malignant neoplasm of prostate: Secondary | ICD-10-CM

## 2017-04-20 DIAGNOSIS — Z170 Estrogen receptor positive status [ER+]: Principal | ICD-10-CM

## 2017-04-20 DIAGNOSIS — Z803 Family history of malignant neoplasm of breast: Secondary | ICD-10-CM | POA: Diagnosis not present

## 2017-04-20 DIAGNOSIS — C50512 Malignant neoplasm of lower-outer quadrant of left female breast: Secondary | ICD-10-CM | POA: Diagnosis not present

## 2017-04-20 NOTE — Progress Notes (Signed)
REFERRING PROVIDER: Nicholas Lose, MD [address] [place], [place] [address]  PRIMARY PROVIDER:  Orpah Melter, MD  PRIMARY REASON FOR VISIT:  1. Malignant neoplasm of lower-outer quadrant of left breast of female, estrogen receptor positive ([facility])   2. Family history of breast cancer   3. Family history of prostate cancer      HISTORY OF PRESENT ILLNESS:   Ms. Ciocca, a 56 y.o. female, was seen for a [facility] cancer genetics consultation at the request of Dr. Lindi Adie due to a personal and family history of cancer.  Ms. Keough presents to clinic today to discuss the possibility of a hereditary predisposition to cancer, genetic testing, and to further clarify her future cancer risks, as well as potential cancer risks for family members.   In 2018, at the age of 26, Ms. 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 This will be treated with chemotherapy, surgery, radiation, tamoxifen and herceptin.      CANCER HISTORY:    Malignant neoplasm of lower-outer quadrant of left breast of female, estrogen receptor positive ([facility])   03/01/2017 Initial Diagnosis    Left breast 2:30 position: 2.1 cm irregular solid mass, additional 0.8 cm oval mass in the left breast (fibrocystic); primary mass on biopsy revealed IDC grade 3, ER 100%, PR 80%, Ki-67 25%, HER-2 positive ratio 1.88, copy #6.2, T2 N0 stage II a clinical stage      03/29/2017 -  Neo-Adjuvant Chemotherapy    Neoadjuvant chemotherapy with Taxol Herceptin        HORMONAL RISK FACTORS:  Menarche was at age 16.  First live birth at age 19.  OCP use for approximately <1 years.  Ovaries intact: yes.  Hysterectomy: no.  Menopausal status: postmenopausal.  HRT use: 0 years. Colonoscopy: no; not examined. Mammogram within the last year: yes. Number of breast biopsies: 1. Up to date with pelvic exams:  no. Any excessive radiation exposure in the past:  no  Past  Medical History:  Diagnosis Date  . Anxiety   . Asthma   . Cancer [facility])    breast cancer  . Depression   . Family history of breast cancer   . Family history of prostate cancer   . Hypertension   . Seasonal allergies     Past Surgical History:  Procedure Laterality Date  . PORTACATH PLACEMENT Right 03/28/2017   Procedure: INSERTION PORT-A-CATH WITH [place];  Surgeon: Rolm Bookbinder, MD;  Location: [facility];  Service: General;  Laterality: Right;    Social History   Social History  . Marital status: Divorced    Spouse name: N/A  . Number of children: N/A  . Years of education: N/A   Social History Main Topics  . Smoking status: Never Smoker  . Smokeless tobacco: Never Used  . Alcohol use No  . Drug use: Yes    Types: Marijuana     Comment: last use 03/25/17  . Sexual activity: Not Currently   Other Topics Concern  . None   Social History Narrative  . None     FAMILY HISTORY:  We obtained a detailed, 4-generation family history.  Significant diagnoses are listed below: Family History  Problem Relation Age of Onset  . Heart disease Mother   . Hypertension Mother   . Kidney failure Father   . Diabetes Father   . Hypertension Father   . Prostate cancer Father 63  . Brain cancer Maternal Grandmother 59  .  Prostate cancer Maternal Grandfather        dx in his 41s  . Breast cancer Paternal Grandmother   . Alcohol abuse Maternal Aunt   . Other Maternal Uncle        died in [place]  . Breast cancer Cousin        maternal first cousin dx in her 25s  . Mental retardation Cousin   . Breast cancer Other        maternal second cousin  . Breast cancer Cousin        paternal first cousin  . Breast cancer Cousin        paternal first cousin    The patient has three sons who are cancer free.  She has a brother and two sisters who are cancer free. However, her brother reportedly has a lot of prostate problems. Her father passed away at age 64 and had prostate cancer diagnosed  just before he died.  Her mother is alive at 41.  Her mother had three sisters and two brothers.  One sister died at birth from locked bowels, another sister died at 65 from alcohol abuse and a brother died at 71 from a drunk driver.  One sister had a daughter who has mental retardation and who was diagnosed with breast cancer at 51.  The patient's maternal grandparents are deceased.  Her grandmother died of brain cancer at age 46 and her grandfather had prostate cancer in his 97's.  Her grandfather's brother had a granddaughter who had breast cancer.  The patient's father had two brothers and a sister.  None had cancer, but the sister and one brother each had a daughter with breast cancer.  His mother had breast cancer and died at 38 and his father died of old age.  Ms. Goulart is unaware of previous family history of genetic testing for hereditary cancer risks. Patient's maternal ancestors are of [place] descent, and paternal ancestors are of [place] descent. There is no reported Ashkenazi Jewish ancestry. There is no known consanguinity.  GENETIC COUNSELING ASSESSMENT: VICTORIOUS COSIO is a 56 y.o. female with a personal history of breast cancer and family history of breast and prostate cancer which is somewhat suggestive of a hereditary cancer syndrome and predisposition to cancer. We, therefore, discussed and recommended the following at today's visit.   DISCUSSION: We discussed that about 5-10% of breast cancer is hereditary with most cases due to BRCA mutations.  Other genes associated with hereditary breast cancer include ATM, CHEK2 and PALB2.  The patient's maternal grandmother had a brain cancer, of unknown etiology.  We discussed that there are some genes that could be associated with brain tumors, but most of those are not associated with breast cancer. We reviewed the characteristics, features and inheritance patterns of hereditary cancer syndromes. We also discussed genetic testing, including the  appropriate family members to test, the process of testing, insurance coverage and turn-around-time for results. We discussed the implications of a negative, positive and/or variant of uncertain significant result. We recommended Ms. Calamia pursue genetic testing for the Multi-gene panel. The Multi-Gene Panel offered by [facility] includes sequencing and/or deletion duplication testing of the following 80 genes: ALK, APC, ATM, AXIN2,BAP1,  BARD1, BLM, BMPR1A, BRCA1, BRCA2, BRIP1, CASR, CDC73, CDH1, CDK4, CDKN1B, CDKN1C, CDKN2A (p14ARF), CDKN2A (p16INK4a), CEBPA, CHEK2, CTNNA1, DICER1, DIS3L2, EGFR (c.2369C>T, p.Thr790Met variant only), EPCAM (Deletion/duplication testing only), FH, FLCN, GATA2, GPC3, GREM1 (Promoter region deletion/duplication testing only), HOXB13 (c.251G>A, p.Gly84Glu), HRAS, KIT, MAX, MEN1, MET,  MITF (c.952G>A, p.Glu318Lys variant only), MLH1, MSH2, MSH3, MSH6, MUTYH, NBN, NF1, NF2, NTHL1, PALB2, PDGFRA, PHOX2B, PMS2, POLD1, POLE, POT1, PRKAR1A, PTCH1, PTEN, RAD50, RAD51C, RAD51D, RB1, RECQL4, RET, RUNX1, SDHAF2, SDHA (sequence changes only), SDHB, SDHC, SDHD, SMAD4, SMARCA4, SMARCB1, SMARCE1, STK11, SUFU, TERT, TERT, TMEM127, TP53, TSC1, TSC2, VHL, WRN and WT1.    Based on Ms. Brunelli's personal and family history of cancer, she meets medical criteria for genetic testing. Despite that she meets criteria, she may still have an out of pocket cost. We discussed that if her out of pocket cost for testing is over $100, the laboratory will call and confirm whether she wants to proceed with testing.  If the out of pocket cost of testing is less than $100 she will be billed by the genetic testing laboratory.   PLAN: After considering the risks, benefits, and limitations, Ms. Dray  provided informed consent to pursue genetic testing and the blood sample was sent to [facility] for analysis of the Multi-gene cancer panel. Results should be available within approximately 2-3 weeks' time, at which  point they will be disclosed by telephone to Ms. Vessey, as will any additional recommendations warranted by these results. Ms. Diloreto will receive a summary of her genetic counseling visit and a copy of her results once available. This information will also be available in Epic. We encouraged Ms. Amick to remain in contact with cancer genetics annually so that we can continuously update the family history and inform her of any changes in cancer genetics and testing that may be of benefit for her family. Ms. Strupp questions were answered to her satisfaction today. Our contact information was provided should additional questions or concerns arise.  Lastly, we encouraged Ms. Swallow to remain in contact with cancer genetics annually so that we can continuously update the family history and inform her of any changes in cancer genetics and testing that may be of benefit for this family.   Ms.  Taira questions were answered to her satisfaction today. Our contact information was provided should additional questions or concerns arise. Thank you for the referral and allowing [place] to share in the care of your patient.   Karen P. Florene Glen, [place], [facility] Certified Genetic Counselor [email or web address] phone: [phone]  The patient was seen for a total of 55 minutes in face-to-face genetic counseling.  This patient was discussed with Drs. Magrinat, Lindi Adie and/or Burr Medico who agrees with the above.    _______________________________________________________________________ For Office Staff:  Number of people involved in session: 2 Was an Intern/ student involved with case: no

## 2017-04-25 ENCOUNTER — Encounter: Payer: Self-pay | Admitting: Hematology and Oncology

## 2017-04-26 ENCOUNTER — Ambulatory Visit (HOSPITAL_BASED_OUTPATIENT_CLINIC_OR_DEPARTMENT_OTHER): Payer: BC Managed Care – PPO

## 2017-04-26 ENCOUNTER — Encounter: Payer: Self-pay | Admitting: *Deleted

## 2017-04-26 ENCOUNTER — Encounter: Payer: Self-pay | Admitting: Hematology and Oncology

## 2017-04-26 ENCOUNTER — Ambulatory Visit (HOSPITAL_BASED_OUTPATIENT_CLINIC_OR_DEPARTMENT_OTHER): Payer: BC Managed Care – PPO | Admitting: Hematology and Oncology

## 2017-04-26 ENCOUNTER — Ambulatory Visit: Payer: BC Managed Care – PPO

## 2017-04-26 ENCOUNTER — Other Ambulatory Visit (HOSPITAL_BASED_OUTPATIENT_CLINIC_OR_DEPARTMENT_OTHER): Payer: BC Managed Care – PPO

## 2017-04-26 DIAGNOSIS — C50512 Malignant neoplasm of lower-outer quadrant of left female breast: Secondary | ICD-10-CM | POA: Diagnosis not present

## 2017-04-26 DIAGNOSIS — Z170 Estrogen receptor positive status [ER+]: Principal | ICD-10-CM

## 2017-04-26 DIAGNOSIS — Z5112 Encounter for antineoplastic immunotherapy: Secondary | ICD-10-CM

## 2017-04-26 DIAGNOSIS — Z95828 Presence of other vascular implants and grafts: Secondary | ICD-10-CM

## 2017-04-26 DIAGNOSIS — Z5111 Encounter for antineoplastic chemotherapy: Secondary | ICD-10-CM | POA: Diagnosis not present

## 2017-04-26 LAB — CBC WITH DIFFERENTIAL/PLATELET
BASO%: 0.3 % (ref 0.0–2.0)
Basophils Absolute: 0 10*3/uL (ref 0.0–0.1)
EOS ABS: 0.1 10*3/uL (ref 0.0–0.5)
EOS%: 2.7 % (ref 0.0–7.0)
HCT: 34.9 % (ref 34.8–46.6)
HGB: 11.6 g/dL (ref 11.6–15.9)
LYMPH%: 34.6 % (ref 14.0–49.7)
MCH: 31.1 pg (ref 25.1–34.0)
MCHC: 33.2 g/dL (ref 31.5–36.0)
MCV: 93.6 fL (ref 79.5–101.0)
MONO#: 0.3 10*3/uL (ref 0.1–0.9)
MONO%: 6.7 % (ref 0.0–14.0)
NEUT#: 2.1 10*3/uL (ref 1.5–6.5)
NEUT%: 55.7 % (ref 38.4–76.8)
PLATELETS: 226 10*3/uL (ref 145–400)
RBC: 3.73 10*6/uL (ref 3.70–5.45)
RDW: 14.3 % (ref 11.2–14.5)
WBC: 3.7 10*3/uL — ABNORMAL LOW (ref 3.9–10.3)
lymph#: 1.3 10*3/uL (ref 0.9–3.3)

## 2017-04-26 LAB — COMPREHENSIVE METABOLIC PANEL
ALT: 38 U/L (ref 0–55)
ANION GAP: 8 meq/L (ref 3–11)
AST: 15 U/L (ref 5–34)
Albumin: 3.6 g/dL (ref 3.5–5.0)
Alkaline Phosphatase: 113 U/L (ref 40–150)
BILIRUBIN TOTAL: 0.23 mg/dL (ref 0.20–1.20)
BUN: 12.3 mg/dL (ref 7.0–26.0)
CHLORIDE: 109 meq/L (ref 98–109)
CO2: 25 meq/L (ref 22–29)
Calcium: 8.9 mg/dL (ref 8.4–10.4)
Creatinine: 0.9 mg/dL (ref 0.6–1.1)
EGFR: 73 mL/min/{1.73_m2} — AB (ref >90)
Glucose: 108 mg/dl (ref 70–140)
POTASSIUM: 3.8 meq/L (ref 3.5–5.1)
Sodium: 141 mEq/L (ref 136–145)
Total Protein: 6.1 g/dL — ABNORMAL LOW (ref 6.4–8.3)

## 2017-04-26 MED ORDER — SODIUM CHLORIDE 0.9 % IV SOLN
Freq: Once | INTRAVENOUS | Status: AC
  Administered 2017-04-26: 09:00:00 via INTRAVENOUS

## 2017-04-26 MED ORDER — SODIUM CHLORIDE 0.9% FLUSH
10.0000 mL | INTRAVENOUS | Status: DC | PRN
  Administered 2017-04-26: 10 mL
  Filled 2017-04-26: qty 10

## 2017-04-26 MED ORDER — SODIUM CHLORIDE 0.9 % IV SOLN
10.0000 mg | Freq: Once | INTRAVENOUS | Status: AC
  Administered 2017-04-26: 10 mg via INTRAVENOUS
  Filled 2017-04-26: qty 1

## 2017-04-26 MED ORDER — HEPARIN SOD (PORK) LOCK FLUSH 100 UNIT/ML IV SOLN
500.0000 [IU] | Freq: Once | INTRAVENOUS | Status: AC | PRN
  Administered 2017-04-26: 500 [IU]
  Filled 2017-04-26: qty 5

## 2017-04-26 MED ORDER — ACETAMINOPHEN 325 MG PO TABS
650.0000 mg | ORAL_TABLET | Freq: Once | ORAL | Status: AC
  Administered 2017-04-26: 650 mg via ORAL

## 2017-04-26 MED ORDER — DEXAMETHASONE SODIUM PHOSPHATE 10 MG/ML IJ SOLN
INTRAMUSCULAR | Status: AC
  Filled 2017-04-26: qty 1

## 2017-04-26 MED ORDER — ACETAMINOPHEN 325 MG PO TABS
ORAL_TABLET | ORAL | Status: AC
  Filled 2017-04-26: qty 2

## 2017-04-26 MED ORDER — FAMOTIDINE IN NACL 20-0.9 MG/50ML-% IV SOLN
20.0000 mg | Freq: Once | INTRAVENOUS | Status: AC
  Administered 2017-04-26: 20 mg via INTRAVENOUS

## 2017-04-26 MED ORDER — DIPHENHYDRAMINE HCL 50 MG/ML IJ SOLN
INTRAMUSCULAR | Status: AC
  Filled 2017-04-26: qty 1

## 2017-04-26 MED ORDER — FAMOTIDINE IN NACL 20-0.9 MG/50ML-% IV SOLN
INTRAVENOUS | Status: AC
  Filled 2017-04-26: qty 50

## 2017-04-26 MED ORDER — DIPHENHYDRAMINE HCL 50 MG/ML IJ SOLN
25.0000 mg | Freq: Once | INTRAMUSCULAR | Status: AC
  Administered 2017-04-26: 25 mg via INTRAVENOUS

## 2017-04-26 MED ORDER — SODIUM CHLORIDE 0.9% FLUSH
10.0000 mL | Freq: Once | INTRAVENOUS | Status: AC
  Administered 2017-04-26: 10 mL
  Filled 2017-04-26: qty 10

## 2017-04-26 MED ORDER — PACLITAXEL CHEMO INJECTION 300 MG/50ML
65.0000 mg/m2 | Freq: Once | INTRAVENOUS | Status: AC
  Administered 2017-04-26: 120 mg via INTRAVENOUS
  Filled 2017-04-26: qty 20

## 2017-04-26 MED ORDER — TRASTUZUMAB CHEMO 150 MG IV SOLR
150.0000 mg | Freq: Once | INTRAVENOUS | Status: AC
  Administered 2017-04-26: 150 mg via INTRAVENOUS
  Filled 2017-04-26: qty 7.14

## 2017-04-26 NOTE — Patient Instructions (Signed)
[facility] Discharge Instructions for Patients Receiving Chemotherapy  Today you received the following chemotherapy agents:  Herceptin and Taxol.  To help prevent nausea and vomiting after your treatment, we encourage you to take your nausea medication as directed.   If you develop nausea and vomiting that is not controlled by your nausea medication, call the clinic.   BELOW ARE SYMPTOMS THAT SHOULD BE REPORTED IMMEDIATELY:  *FEVER GREATER THAN 100.5 F  *CHILLS WITH OR WITHOUT FEVER  NAUSEA AND VOMITING THAT IS NOT CONTROLLED WITH YOUR NAUSEA MEDICATION  *UNUSUAL SHORTNESS OF BREATH  *UNUSUAL BRUISING OR BLEEDING  TENDERNESS IN MOUTH AND THROAT WITH OR WITHOUT PRESENCE OF ULCERS  *URINARY PROBLEMS  *BOWEL PROBLEMS  UNUSUAL RASH Items with * indicate a potential emergency and should be followed up as soon as possible.  Feel free to call the clinic you have any questions or concerns. The clinic phone number is [phone].  Please show the [facility] at check-in to the Emergency Department and triage nurse.

## 2017-04-26 NOTE — Patient Instructions (Signed)

## 2017-04-26 NOTE — Assessment & Plan Note (Signed)
Left breast 2:30 position: 2.1 cm irregular solid mass, additional 0.8 cm oval mass in the left breast (fibrocystic); primary mass on biopsy revealed IDC grade 3, ER 100%, PR 80%, Ki-67 25%, HER-2 positive ratio 1.88, copy #6.2, T2 N0 stage II a clinical stage  Treatment plan: 1. Neoadjuvant Taxol Herceptin weekly 12 followed by Herceptin maintenance for 6 months 2. breast conserving surgery with sentinel lymph node biopsy 3. Adjuvant radiation therapy 4. Followed by adjuvant antiestrogen therapy ----------------------------------------------------------------------------------------------------------------------------------------- Current treatment: Taxol Herceptin cycle 5 Chemotherapy toxicities: 1 Mild nausea 2. decreased neutrophil count ANC 1.3 decreased the dosage of Taxol. Today's ANC is 1.8. Continue with the same dosage. 3. Mouth sores: Encouraged her to use salt water gargles. If it does not get better we can call in for Magic mouthwash. 4. Elevated AST: AST levels are stable at 59.  Continue with the same dosage of chemotherapy. We do not need to do her Neupogen injections. Return to clinic in [facility] for follow-up.  If her counts remain low, we may have to give her dose of Neupogen the day before chemotherapy

## 2017-04-26 NOTE — Progress Notes (Signed)
Patient walked in to bring cell phone bill to be paid through grant.  Patient also had some billing questions or concerns regarding payment arrangements. Reviewed balance and advised what was showing as of right now through physician billing. Patient states she contacted [facility] to set up arrangements and was told to contact hospital billing. She called the number that I provided for physician billing. I apologized and gave her the number for hospital billing.  Patient has my name and number for any additional financial questions or concerns.

## 2017-04-26 NOTE — Progress Notes (Signed)
Patient Care Team: Orpah Melter, MD as PCP - General (Family Medicine) Excell Seltzer, MD as Consulting Physician (General Surgery) Magrinat, Virgie Dad, MD as Consulting Physician (Oncology) Eppie Gibson, MD as Attending Physician (Radiation Oncology) Huel Cote, NP as Nurse Practitioner (Obstetrics and Gynecology)  DIAGNOSIS:  Encounter Diagnosis  Name Primary?  . Malignant neoplasm of lower-outer quadrant of left breast of female, estrogen receptor positive ([place])     SUMMARY OF ONCOLOGIC HISTORY:   Malignant neoplasm of lower-outer quadrant of left breast of female, estrogen receptor positive ([place])   03/01/2017 Initial Diagnosis    Left breast 2:30 position: 2.1 cm irregular solid mass, additional 0.8 cm oval mass in the left breast (fibrocystic); primary mass on biopsy revealed IDC grade 3, ER 100%, PR 80%, Ki-67 25%, HER-2 positive ratio 1.88, copy #6.2, T2 N0 stage II a clinical stage      03/29/2017 -  Neo-Adjuvant Chemotherapy    Neoadjuvant chemotherapy with Taxol Herceptin       CHIEF COMPLIANT: Cycle 5 Taxol Herceptin  INTERVAL HISTORY: Haley Sanchez is a 56 year old with above-mentioned history left breast cancer currently on neoadjuvant chemotherapy with Taxol Herceptin. Today is cycle 5 of treatment. She appears to be tolerating it fairly well except for loss of taste in her she is anxious about going back to school in a month. Denies any fevers or chills. Denies any nausea vomiting denies any neuropathy. She did notice a rash underneath her left breast that is maculopapular in nature and slightly itching that started this morning.  REVIEW OF SYSTEMS:   Constitutional: Denies fevers, chills or abnormal weight loss Eyes: Denies blurriness of vision Ears, nose, mouth, throat, and face: Denies mucositis or sore throat Respiratory: Denies cough, dyspnea or wheezes Cardiovascular: Denies palpitation, chest discomfort Gastrointestinal:  Denies nausea,  heartburn or change in bowel habits Skin: Maculopapular rash with itching Lymphatics: Denies new lymphadenopathy or easy bruising Neurological:Denies numbness, tingling or new weaknesses Behavioral/Psych: Mood is stable, no new changes  Extremities: No lower extremity edema Breast:  denies any pain or lumps or nodules in either breasts All other systems were reviewed with the patient and are negative.  I have reviewed the past medical history, past surgical history, social history and family history with the patient and they are unchanged from previous note.  ALLERGIES:  is allergic to no known allergies.  MEDICATIONS:  Current Outpatient Prescriptions  Medication Sig Dispense Refill  . buPROPion (WELLBUTRIN XL) 300 MG 24 hr tablet Take 300 mg by mouth daily.    . Coenzyme Q10 (COQ10) 100 MG CAPS Take 1 capsule by mouth daily.    . Cyanocobalamin (B-12) 1000 MCG SUBL Place 1 tablet under the tongue daily.    Marland Kitchen HYDROcodone-acetaminophen (NORCO/VICODIN) 5-325 MG tablet Take 1 tablet by mouth every 4 (four) hours as needed. 10 tablet 0  . lidocaine-prilocaine (EMLA) cream Apply to affected area once 30 g 3  . lisdexamfetamine (VYVANSE) 50 MG capsule Take 100 mg by mouth daily as needed (Take only when working).    Marland Kitchen losartan (COZAAR) 100 MG tablet Take 1 tablet by mouth daily.  0  . ondansetron (ZOFRAN) 8 MG tablet Take 1 tablet (8 mg total) by mouth 2 (two) times daily as needed (Nausea or vomiting). 30 tablet 1  . PARoxetine (PAXIL) 40 MG tablet Take 40 mg by mouth every morning.    . prochlorperazine (COMPAZINE) 10 MG tablet Take 1 tablet (10 mg total) by mouth every 6 (six) hours  as needed (Nausea or vomiting). 30 tablet 1   No current facility-administered medications for this visit.     PHYSICAL EXAMINATION: ECOG PERFORMANCE STATUS: 1 - Symptomatic but completely ambulatory  Vitals:   04/26/17 0835  BP: (!) 146/87  Pulse: 72  Resp: 20  Temp: 98.1 F (36.7 C)   Filed  Weights   04/26/17 0835  Weight: 168 lb 14.4 oz (76.6 kg)    GENERAL:alert, no distress and comfortable SKIN: Maculopapular rash with itching beneath the left breast EYES: normal, Conjunctiva are pink and non-injected, sclera clear OROPHARYNX:no exudate, no erythema and lips, buccal mucosa, and tongue normal  NECK: supple, thyroid normal size, non-tender, without nodularity LYMPH:  no palpable lymphadenopathy in the cervical, axillary or inguinal LUNGS: clear to auscultation and percussion with normal breathing effort HEART: regular rate & rhythm and no murmurs and no lower extremity edema ABDOMEN:abdomen soft, non-tender and normal bowel sounds MUSCULOSKELETAL:no cyanosis of digits and no clubbing  NEURO: alert & oriented x 3 with fluent speech, no focal motor/sensory deficits EXTREMITIES: No lower extremity edema   LABORATORY DATA:  I have reviewed the data as listed   Chemistry      Component Value Date/Time   NA 138 04/18/2017 1422   K 4.0 04/18/2017 1422   CO2 29 04/18/2017 1422   BUN 16.3 04/18/2017 1422   CREATININE 1.0 04/18/2017 1422      Component Value Date/Time   CALCIUM 8.7 04/18/2017 1422   ALKPHOS 108 04/18/2017 1422   AST 16 04/18/2017 1422   ALT 59 (H) 04/18/2017 1422   BILITOT 0.24 04/18/2017 1422       Lab Results  Component Value Date   WBC 3.7 (L) 04/26/2017   HGB 11.6 04/26/2017   HCT 34.9 04/26/2017   MCV 93.6 04/26/2017   PLT 226 04/26/2017   NEUTROABS 2.1 04/26/2017    ASSESSMENT & PLAN:  Malignant neoplasm of lower-outer quadrant of left breast of female, estrogen receptor positive ([facility]) Left breast 2:30 position: 2.1 cm irregular solid mass, additional 0.8 cm oval mass in the left breast (fibrocystic); primary mass on biopsy revealed IDC grade 3, ER 100%, PR 80%, Ki-67 25%, HER-2 positive ratio 1.88, copy #6.2, T2 N0 stage II a clinical stage  Treatment plan: 1. Neoadjuvant Taxol Herceptin weekly 12 followed by Herceptin maintenance  for 6 months 2. breast conserving surgery with sentinel lymph node biopsy 3. Adjuvant radiation therapy 4. Followed by adjuvant antiestrogen therapy ----------------------------------------------------------------------------------------------------------------------------------------- Current treatment: Taxol Herceptin cycle 5 Chemotherapy toxicities: 1 Mild nausea 2. decreased neutrophil count ANC 1.3 decreased the dosage of Taxol. Today's ANC is 2.1. Continue with the same dosage. 3. Mouth sores: Encouraged her to use salt water gargles. If it does not get better we can call in for Magic mouthwash. 4. Elevated AST: AST levels are stable at 59. 5. Maculopapular rash left chest wall: Encouraged her to use Benadryl cream and cortisone cream. We will give her Benadryl and steroids today with treatment. 6. Alopecia 7. Decreased taste  Continue with the same dosage of chemotherapy. We do not need to do her Neupogen injections. Return to clinic in 2weeks for follow-up.   I spent 25 minutes talking to the patient of which more than half was spent in counseling and coordination of care.  No orders of the defined types were placed in this encounter.  The patient has a good understanding of the overall plan. she agrees with it. she will call with any problems that may develop before  the next visit here.   Rulon Eisenmenger, MD 04/26/17

## 2017-04-29 ENCOUNTER — Encounter: Payer: Self-pay | Admitting: Genetic Counselor

## 2017-04-29 ENCOUNTER — Ambulatory Visit: Payer: Self-pay | Admitting: Genetic Counselor

## 2017-04-29 ENCOUNTER — Telehealth: Payer: Self-pay | Admitting: Genetic Counselor

## 2017-04-29 DIAGNOSIS — Z170 Estrogen receptor positive status [ER+]: Secondary | ICD-10-CM

## 2017-04-29 DIAGNOSIS — C50512 Malignant neoplasm of lower-outer quadrant of left female breast: Secondary | ICD-10-CM

## 2017-04-29 DIAGNOSIS — Z803 Family history of malignant neoplasm of breast: Secondary | ICD-10-CM

## 2017-04-29 DIAGNOSIS — Z1379 Encounter for other screening for genetic and chromosomal anomalies: Secondary | ICD-10-CM

## 2017-04-29 DIAGNOSIS — Z8042 Family history of malignant neoplasm of prostate: Secondary | ICD-10-CM

## 2017-04-29 NOTE — Progress Notes (Signed)
HPI: Haley Sanchez was previously seen in the [facility] due to a personal and family history of cancer and concerns regarding a hereditary predisposition to cancer. Please refer to our prior cancer genetics clinic note for more information regarding Haley Sanchez's medical, social and family histories, and our assessment and recommendations, at the time. Haley Sanchez recent genetic test results were disclosed to her, as were recommendations warranted by these results. These results and recommendations are discussed in more detail below.  CANCER HISTORY:    Malignant neoplasm of lower-outer quadrant of left breast of female, estrogen receptor positive ([place])   03/01/2017 Initial Diagnosis    Left breast 2:30 position: 2.1 cm irregular solid mass, additional 0.8 cm oval mass in the left breast (fibrocystic); primary mass on biopsy revealed IDC grade 3, ER 100%, PR 80%, Ki-67 25%, HER-2 positive ratio 1.88, copy #6.2, T2 N0 stage II a clinical stage      03/29/2017 -  Neo-Adjuvant Chemotherapy    Neoadjuvant chemotherapy with Taxol Herceptin      04/27/2017 Genetic Testing    Negative genetic testing on the multi-gene panel.  The Multi-Gene Panel offered by [facility] includes sequencing and/or deletion duplication testing of the following 80 genes: ALK, APC, ATM, AXIN2,BAP1,  BARD1, BLM, BMPR1A, BRCA1, BRCA2, BRIP1, CASR, CDC73, CDH1, CDK4, CDKN1B, CDKN1C, CDKN2A (p14ARF), CDKN2A (p16INK4a), CEBPA, CHEK2, CTNNA1, DICER1, DIS3L2, EGFR (c.2369C>T, p.Thr790Met variant only), EPCAM (Deletion/duplication testing only), FH, FLCN, GATA2, GPC3, GREM1 (Promoter region deletion/duplication testing only), HOXB13 (c.251G>A, p.Gly84Glu), HRAS, KIT, MAX, MEN1, MET, MITF (c.952G>A, p.Glu318Lys variant only), MLH1, MSH2, MSH3, MSH6, MUTYH, NBN, NF1, NF2, NTHL1, PALB2, PDGFRA, PHOX2B, PMS2, POLD1, POLE, POT1, PRKAR1A, PTCH1, PTEN, RAD50, RAD51C, RAD51D, RB1, RECQL4, RET, RUNX1, SDHAF2, SDHA (sequence changes  only), SDHB, SDHC, SDHD, SMAD4, SMARCA4, SMARCB1, SMARCE1, STK11, SUFU, TERT, TERT, TMEM127, TP53, TSC1, TSC2, VHL, WRN and WT1.  The report date is April 27, 2017.        FAMILY HISTORY:  We obtained a detailed, 4-generation family history.  Significant diagnoses are listed below: Family History  Problem Relation Age of Onset  . Heart disease Mother   . Hypertension Mother   . Kidney failure Father   . Diabetes Father   . Hypertension Father   . Prostate cancer Father 55  . Brain cancer Maternal Grandmother 63  . Prostate cancer Maternal Grandfather        dx in his 83s  . Breast cancer Paternal Grandmother   . Alcohol abuse Maternal Aunt   . Other Maternal Uncle        died in [place]  . Breast cancer Cousin        maternal first cousin dx in her 96s  . Mental retardation Cousin   . Breast cancer Other        maternal second cousin  . Breast cancer Cousin        paternal first cousin  . Breast cancer Cousin        paternal first cousin    The patient has three sons who are cancer free.  She has a brother and two sisters who are cancer free. However, her brother reportedly has a lot of prostate problems. Her father passed away at age 19 and had prostate cancer diagnosed just before he died.  Her mother is alive at 19.  Her mother had three sisters and two brothers.  One sister died at birth from locked bowels, another sister died at 87 from alcohol abuse  and a brother died at 61 from a drunk driver.  One sister had a daughter who has mental retardation and who was diagnosed with breast cancer at 45.  The patient's maternal grandparents are deceased.  Her grandmother died of brain cancer at age 65 and her grandfather had prostate cancer in his 69's.  Her grandfather's brother had a granddaughter who had breast cancer.  The patient's father had two brothers and a sister.  None had cancer, but the sister and one brother each had a daughter with breast cancer.  His mother had breast  cancer and died at 80 and his father died of old age.  Haley Sanchez is unaware of previous family history of genetic testing for hereditary cancer risks. Patient's maternal ancestors are of [place] descent, and paternal ancestors are of [place] descent. There is no reported Ashkenazi Jewish ancestry. There is no known consanguinity.  GENETIC TEST RESULTS: Genetic testing reported out on April 27, 2017 through the multi-gene cancer panel found no deleterious mutations.  The Multi-Gene Panel offered by [facility] includes sequencing and/or deletion duplication testing of the following 80 genes: ALK, APC, ATM, AXIN2,BAP1,  BARD1, BLM, BMPR1A, BRCA1, BRCA2, BRIP1, CASR, CDC73, CDH1, CDK4, CDKN1B, CDKN1C, CDKN2A (p14ARF), CDKN2A (p16INK4a), CEBPA, CHEK2, CTNNA1, DICER1, DIS3L2, EGFR (c.2369C>T, p.Thr790Met variant only), EPCAM (Deletion/duplication testing only), FH, FLCN, GATA2, GPC3, GREM1 (Promoter region deletion/duplication testing only), HOXB13 (c.251G>A, p.Gly84Glu), HRAS, KIT, MAX, MEN1, MET, MITF (c.952G>A, p.Glu318Lys variant only), MLH1, MSH2, MSH3, MSH6, MUTYH, NBN, NF1, NF2, NTHL1, PALB2, PDGFRA, PHOX2B, PMS2, POLD1, POLE, POT1, PRKAR1A, PTCH1, PTEN, RAD50, RAD51C, RAD51D, RB1, RECQL4, RET, RUNX1, SDHAF2, SDHA (sequence changes only), SDHB, SDHC, SDHD, SMAD4, SMARCA4, SMARCB1, SMARCE1, STK11, SUFU, TERT, TERT, TMEM127, TP53, TSC1, TSC2, VHL, WRN and WT1.  The test report has been scanned into EPIC and is located under the Molecular Pathology section of the Results Review tab.   We discussed with Haley Sanchez that since the current genetic testing is not perfect, it is possible there may be a gene mutation in one of these genes that current testing cannot detect, but that chance is small. We also discussed, that it is possible that another gene that has not yet been discovered, or that we have not yet tested, is responsible for the cancer diagnoses in the family, and it is, therefore, important to remain in  touch with cancer genetics in the future so that we can continue to offer Haley Sanchez the most up to date genetic testing.     CANCER SCREENING RECOMMENDATIONS: This result is reassuring and indicates that Haley Sanchez likely does not have an increased risk for a future cancer due to a mutation in one of these genes. This normal test also suggests that Haley Sanchez cancer was most likely not due to an inherited predisposition associated with one of these genes.  Most cancers happen by chance and this negative test suggests that her cancer falls into this category.  We, therefore, recommended she continue to follow the cancer management and screening guidelines provided by her oncology and primary healthcare provider.   RECOMMENDATIONS FOR FAMILY MEMBERS: Women in this family might be at some increased risk of developing cancer, over the general population risk, simply due to the family history of cancer. We recommended women in this family have a yearly mammogram beginning at age 67, or 52 years younger than the earliest onset of cancer, an annual clinical breast exam, and perform monthly breast self-exams. Women in this family should also have a  gynecological exam as recommended by their primary provider. All family members should have a colonoscopy by age 70.  FOLLOW-UP: Lastly, we discussed with Haley Sanchez that cancer genetics is a rapidly advancing field and it is possible that new genetic tests will be appropriate for her and/or her family members in the future. We encouraged her to remain in contact with cancer genetics on an annual basis so we can update her personal and family histories and let her know of advances in cancer genetics that may benefit this family.   Our contact number was provided. Haley Sanchez questions were answered to her satisfaction, and she knows she is welcome to call us at anytime with additional questions or concerns.   Roma Kayser, MS, [facility] Certified Genetic  Counselor [email or web address]

## 2017-04-29 NOTE — Telephone Encounter (Signed)
Revealed negative genetic testing.  Discussed that we do not know why she has breast cancer or why there is cancer in the family. It could be due to a different gene that we are not testing, or maybe our current technology may not be able to pick something up.  It will be important for her to keep in contact with genetics to keep up with whether additional testing may be needed. 

## 2017-05-03 ENCOUNTER — Ambulatory Visit: Payer: BC Managed Care – PPO

## 2017-05-03 ENCOUNTER — Ambulatory Visit (HOSPITAL_BASED_OUTPATIENT_CLINIC_OR_DEPARTMENT_OTHER): Payer: BC Managed Care – PPO

## 2017-05-03 ENCOUNTER — Encounter: Payer: Self-pay | Admitting: Hematology and Oncology

## 2017-05-03 ENCOUNTER — Other Ambulatory Visit (HOSPITAL_BASED_OUTPATIENT_CLINIC_OR_DEPARTMENT_OTHER): Payer: BC Managed Care – PPO

## 2017-05-03 VITALS — BP 152/82 | HR 75 | Temp 98.4°F | Resp 18

## 2017-05-03 DIAGNOSIS — Z5111 Encounter for antineoplastic chemotherapy: Secondary | ICD-10-CM | POA: Diagnosis not present

## 2017-05-03 DIAGNOSIS — C50512 Malignant neoplasm of lower-outer quadrant of left female breast: Secondary | ICD-10-CM

## 2017-05-03 DIAGNOSIS — Z95828 Presence of other vascular implants and grafts: Secondary | ICD-10-CM

## 2017-05-03 DIAGNOSIS — Z5112 Encounter for antineoplastic immunotherapy: Secondary | ICD-10-CM | POA: Diagnosis not present

## 2017-05-03 DIAGNOSIS — Z170 Estrogen receptor positive status [ER+]: Principal | ICD-10-CM

## 2017-05-03 LAB — COMPREHENSIVE METABOLIC PANEL
ALT: 47 U/L (ref 0–55)
AST: 20 U/L (ref 5–34)
Albumin: 3.7 g/dL (ref 3.5–5.0)
Alkaline Phosphatase: 116 U/L (ref 40–150)
Anion Gap: 9 mEq/L (ref 3–11)
BILIRUBIN TOTAL: 0.34 mg/dL (ref 0.20–1.20)
BUN: 20.6 mg/dL (ref 7.0–26.0)
CHLORIDE: 108 meq/L (ref 98–109)
CO2: 24 meq/L (ref 22–29)
Calcium: 8.9 mg/dL (ref 8.4–10.4)
Creatinine: 0.9 mg/dL (ref 0.6–1.1)
EGFR: 75 mL/min/{1.73_m2} — AB (ref >90)
GLUCOSE: 83 mg/dL (ref 70–140)
Potassium: 4.3 mEq/L (ref 3.5–5.1)
SODIUM: 141 meq/L (ref 136–145)
TOTAL PROTEIN: 6.5 g/dL (ref 6.4–8.3)

## 2017-05-03 LAB — CBC WITH DIFFERENTIAL/PLATELET
BASO%: 0.7 % (ref 0.0–2.0)
Basophils Absolute: 0 10*3/uL (ref 0.0–0.1)
EOS ABS: 0.1 10*3/uL (ref 0.0–0.5)
EOS%: 2.1 % (ref 0.0–7.0)
HCT: 37.1 % (ref 34.8–46.6)
HGB: 12.7 g/dL (ref 11.6–15.9)
LYMPH%: 20.8 % (ref 14.0–49.7)
MCH: 31.3 pg (ref 25.1–34.0)
MCHC: 34.2 g/dL (ref 31.5–36.0)
MCV: 91.5 fL (ref 79.5–101.0)
MONO#: 0.3 10*3/uL (ref 0.1–0.9)
MONO%: 5.9 % (ref 0.0–14.0)
NEUT%: 70.5 % (ref 38.4–76.8)
NEUTROS ABS: 3.5 10*3/uL (ref 1.5–6.5)
Platelets: 256 10*3/uL (ref 145–400)
RBC: 4.06 10*6/uL (ref 3.70–5.45)
RDW: 14.7 % — ABNORMAL HIGH (ref 11.2–14.5)
WBC: 5 10*3/uL (ref 3.9–10.3)
lymph#: 1 10*3/uL (ref 0.9–3.3)

## 2017-05-03 MED ORDER — DIPHENHYDRAMINE HCL 50 MG/ML IJ SOLN
25.0000 mg | Freq: Once | INTRAMUSCULAR | Status: AC
  Administered 2017-05-03: 25 mg via INTRAVENOUS

## 2017-05-03 MED ORDER — ACETAMINOPHEN 325 MG PO TABS
650.0000 mg | ORAL_TABLET | Freq: Once | ORAL | Status: AC
  Administered 2017-05-03: 650 mg via ORAL

## 2017-05-03 MED ORDER — SODIUM CHLORIDE 0.9 % IV SOLN
Freq: Once | INTRAVENOUS | Status: AC
  Administered 2017-05-03: 10:00:00 via INTRAVENOUS

## 2017-05-03 MED ORDER — DEXAMETHASONE SODIUM PHOSPHATE 10 MG/ML IJ SOLN
INTRAMUSCULAR | Status: AC
  Filled 2017-05-03: qty 1

## 2017-05-03 MED ORDER — HEPARIN SOD (PORK) LOCK FLUSH 100 UNIT/ML IV SOLN
500.0000 [IU] | Freq: Once | INTRAVENOUS | Status: AC | PRN
  Administered 2017-05-03: 500 [IU]
  Filled 2017-05-03: qty 5

## 2017-05-03 MED ORDER — FAMOTIDINE IN NACL 20-0.9 MG/50ML-% IV SOLN
20.0000 mg | Freq: Once | INTRAVENOUS | Status: AC
  Administered 2017-05-03: 20 mg via INTRAVENOUS

## 2017-05-03 MED ORDER — SODIUM CHLORIDE 0.9% FLUSH
10.0000 mL | Freq: Once | INTRAVENOUS | Status: AC
  Administered 2017-05-03: 10 mL
  Filled 2017-05-03: qty 10

## 2017-05-03 MED ORDER — FAMOTIDINE IN NACL 20-0.9 MG/50ML-% IV SOLN
INTRAVENOUS | Status: AC
  Filled 2017-05-03: qty 50

## 2017-05-03 MED ORDER — DIPHENHYDRAMINE HCL 50 MG/ML IJ SOLN
INTRAMUSCULAR | Status: AC
  Filled 2017-05-03: qty 1

## 2017-05-03 MED ORDER — PACLITAXEL CHEMO INJECTION 300 MG/50ML
65.0000 mg/m2 | Freq: Once | INTRAVENOUS | Status: AC
  Administered 2017-05-03: 120 mg via INTRAVENOUS
  Filled 2017-05-03: qty 20

## 2017-05-03 MED ORDER — SODIUM CHLORIDE 0.9 % IV SOLN
10.0000 mg | Freq: Once | INTRAVENOUS | Status: AC
  Administered 2017-05-03: 10 mg via INTRAVENOUS
  Filled 2017-05-03: qty 1

## 2017-05-03 MED ORDER — TRASTUZUMAB CHEMO 150 MG IV SOLR
150.0000 mg | Freq: Once | INTRAVENOUS | Status: AC
  Administered 2017-05-03: 150 mg via INTRAVENOUS
  Filled 2017-05-03: qty 7.14

## 2017-05-03 MED ORDER — ACETAMINOPHEN 325 MG PO TABS
ORAL_TABLET | ORAL | Status: AC
  Filled 2017-05-03: qty 2

## 2017-05-03 MED ORDER — SODIUM CHLORIDE 0.9% FLUSH
10.0000 mL | INTRAVENOUS | Status: DC | PRN
  Administered 2017-05-03: 10 mL
  Filled 2017-05-03: qty 10

## 2017-05-03 NOTE — Patient Instructions (Signed)
[facility] Discharge Instructions for Patients Receiving Chemotherapy  Today you received the following chemotherapy agents Herceptin, Taxol.   To help prevent nausea and vomiting after your treatment, we encourage you to take your nausea medication as prescribed.    If you develop nausea and vomiting that is not controlled by your nausea medication, call the clinic.   BELOW ARE SYMPTOMS THAT SHOULD BE REPORTED IMMEDIATELY:  *FEVER GREATER THAN 100.5 F  *CHILLS WITH OR WITHOUT FEVER  NAUSEA AND VOMITING THAT IS NOT CONTROLLED WITH YOUR NAUSEA MEDICATION  *UNUSUAL SHORTNESS OF BREATH  *UNUSUAL BRUISING OR BLEEDING  TENDERNESS IN MOUTH AND THROAT WITH OR WITHOUT PRESENCE OF ULCERS  *URINARY PROBLEMS  *BOWEL PROBLEMS  UNUSUAL RASH Items with * indicate a potential emergency and should be followed up as soon as possible.  Feel free to call the clinic you have any questions or concerns. The clinic phone number is [phone].  Please show the [place] at check-in to the Emergency Department and triage nurse.

## 2017-05-03 NOTE — Patient Instructions (Signed)

## 2017-05-03 NOTE — Progress Notes (Signed)
Patient came in to inquire about disability form. Advised that she would need to obtain forms from her HR department and complete the log sheet to have placed in box for Tiffany. Patient had her son with her who verbalized understanding.  Patient also asked about [employer] and had my previous note with number to patient accounting to call regarding this. Advised patient to contact number to have set up. She had copy of bills that she needed to discuss. I separated the hospital accounts from the physician billing accounts and the phone numbers provided are correct.   Patient had questions regarding clarification on how grant funds could be used. Gave patient detailed information in that we would need statement with her name on account and would send the check directly to the merchant. Patient's son verbalized understanding.

## 2017-05-05 ENCOUNTER — Other Ambulatory Visit (HOSPITAL_BASED_OUTPATIENT_CLINIC_OR_DEPARTMENT_OTHER): Payer: BC Managed Care – PPO

## 2017-05-05 ENCOUNTER — Other Ambulatory Visit: Payer: Self-pay | Admitting: *Deleted

## 2017-05-05 ENCOUNTER — Telehealth: Payer: Self-pay

## 2017-05-05 ENCOUNTER — Ambulatory Visit (HOSPITAL_BASED_OUTPATIENT_CLINIC_OR_DEPARTMENT_OTHER): Payer: BC Managed Care – PPO | Admitting: Hematology and Oncology

## 2017-05-05 ENCOUNTER — Other Ambulatory Visit: Payer: Self-pay

## 2017-05-05 ENCOUNTER — Ambulatory Visit (HOSPITAL_COMMUNITY)
Admission: RE | Admit: 2017-05-05 | Discharge: 2017-05-05 | Disposition: A | Discharge status: Home / Self Care | Payer: BC Managed Care – PPO | Source: Ambulatory Visit | Attending: Hematology and Oncology | Admitting: Hematology and Oncology

## 2017-05-05 ENCOUNTER — Ambulatory Visit (HOSPITAL_BASED_OUTPATIENT_CLINIC_OR_DEPARTMENT_OTHER): Payer: BC Managed Care – PPO

## 2017-05-05 ENCOUNTER — Encounter: Payer: Self-pay | Admitting: Hematology and Oncology

## 2017-05-05 VITALS — BP 138/72 | HR 93 | Resp 17

## 2017-05-05 VITALS — BP 152/91 | HR 83 | Temp 98.7°F | Resp 16 | Ht 66.0 in | Wt 166.8 lb

## 2017-05-05 DIAGNOSIS — Z170 Estrogen receptor positive status [ER+]: Secondary | ICD-10-CM | POA: Diagnosis not present

## 2017-05-05 DIAGNOSIS — R319 Hematuria, unspecified: Secondary | ICD-10-CM

## 2017-05-05 DIAGNOSIS — C50512 Malignant neoplasm of lower-outer quadrant of left female breast: Secondary | ICD-10-CM

## 2017-05-05 DIAGNOSIS — R109 Unspecified abdominal pain: Secondary | ICD-10-CM | POA: Insufficient documentation

## 2017-05-05 DIAGNOSIS — E860 Dehydration: Secondary | ICD-10-CM

## 2017-05-05 DIAGNOSIS — R9341 Abnormal radiologic findings on diagnostic imaging of renal pelvis, ureter, or bladder: Secondary | ICD-10-CM | POA: Insufficient documentation

## 2017-05-05 LAB — CBC WITH DIFFERENTIAL/PLATELET
BASO%: 0.2 % (ref 0.0–2.0)
BASOS ABS: 0 10*3/uL (ref 0.0–0.1)
EOS%: 0.7 % (ref 0.0–7.0)
Eosinophils Absolute: 0.1 10*3/uL (ref 0.0–0.5)
HEMATOCRIT: 39.3 % (ref 34.8–46.6)
HGB: 13.1 g/dL (ref 11.6–15.9)
LYMPH#: 1.4 10*3/uL (ref 0.9–3.3)
LYMPH%: 16 % (ref 14.0–49.7)
MCH: 31 pg (ref 25.1–34.0)
MCHC: 33.3 g/dL (ref 31.5–36.0)
MCV: 93.1 fL (ref 79.5–101.0)
MONO#: 0.3 10*3/uL (ref 0.1–0.9)
MONO%: 3.8 % (ref 0.0–14.0)
NEUT#: 6.8 10*3/uL — ABNORMAL HIGH (ref 1.5–6.5)
NEUT%: 79.3 % — AB (ref 38.4–76.8)
PLATELETS: 282 10*3/uL (ref 145–400)
RBC: 4.22 10*6/uL (ref 3.70–5.45)
RDW: 14.4 % (ref 11.2–14.5)
WBC: 8.6 10*3/uL (ref 3.9–10.3)

## 2017-05-05 LAB — URINALYSIS, MICROSCOPIC - CHCC
Bilirubin (Urine): NEGATIVE
GLUCOSE UR CHCC: NEGATIVE mg/dL
Ketones: NEGATIVE mg/dL
NITRITE: NEGATIVE
PH: 6.5 (ref 4.6–8.0)
PROTEIN: 100 mg/dL
Specific Gravity, Urine: 1.005 (ref 1.003–1.035)
Urobilinogen, UR: 0.2 mg/dL (ref 0.2–1)

## 2017-05-05 LAB — COMPREHENSIVE METABOLIC PANEL
ALBUMIN: 4.1 g/dL (ref 3.5–5.0)
ALK PHOS: 100 U/L (ref 40–150)
ALT: 48 U/L (ref 0–55)
AST: 21 U/L (ref 5–34)
Anion Gap: 10 mEq/L (ref 3–11)
BILIRUBIN TOTAL: 0.36 mg/dL (ref 0.20–1.20)
BUN: 13.4 mg/dL (ref 7.0–26.0)
CO2: 28 meq/L (ref 22–29)
Calcium: 9.2 mg/dL (ref 8.4–10.4)
Chloride: 105 mEq/L (ref 98–109)
Creatinine: 1 mg/dL (ref 0.6–1.1)
EGFR: 64 mL/min/{1.73_m2} — AB (ref >90)
GLUCOSE: 70 mg/dL (ref 70–140)
Potassium: 3.6 mEq/L (ref 3.5–5.1)
SODIUM: 143 meq/L (ref 136–145)
TOTAL PROTEIN: 7.1 g/dL (ref 6.4–8.3)

## 2017-05-05 MED ORDER — HYDROCODONE-ACETAMINOPHEN 5-325 MG PO TABS
1.0000 | ORAL_TABLET | Freq: Once | ORAL | Status: AC
  Administered 2017-05-05: 2 via ORAL

## 2017-05-05 MED ORDER — IOPAMIDOL (ISOVUE-300) INJECTION 61%
100.0000 mL | Freq: Once | INTRAVENOUS | Status: AC | PRN
  Administered 2017-05-05: 100 mL via INTRAVENOUS

## 2017-05-05 MED ORDER — HYDROCODONE-ACETAMINOPHEN 5-325 MG PO TABS
ORAL_TABLET | ORAL | Status: AC
  Filled 2017-05-05: qty 2

## 2017-05-05 MED ORDER — HEPARIN SOD (PORK) LOCK FLUSH 100 UNIT/ML IV SOLN
500.0000 [IU] | Freq: Once | INTRAVENOUS | Status: DC
  Administered 2017-05-05: 500 [IU] via INTRAVENOUS

## 2017-05-05 MED ORDER — IOPAMIDOL (ISOVUE-300) INJECTION 61%
INTRAVENOUS | Status: AC
  Filled 2017-05-05: qty 100

## 2017-05-05 MED ORDER — SODIUM CHLORIDE 0.9 % IV SOLN
Freq: Once | INTRAVENOUS | Status: AC
  Administered 2017-05-05: 13:00:00 via INTRAVENOUS

## 2017-05-05 MED ORDER — IOPAMIDOL (ISOVUE-300) INJECTION 61%: 30.0000 mL | Freq: Once | INTRAVENOUS | Status: DC | PRN

## 2017-05-05 MED ORDER — IOPAMIDOL (ISOVUE-300) INJECTION 61%
INTRAVENOUS | Status: AC
  Administered 2017-05-05: 30 mL via ORAL
  Filled 2017-05-05: qty 30

## 2017-05-05 MED ORDER — HEPARIN SOD (PORK) LOCK FLUSH 100 UNIT/ML IV SOLN
INTRAVENOUS | Status: AC
  Administered 2017-05-05: 500 [IU] via INTRAVENOUS
  Filled 2017-05-05: qty 5

## 2017-05-05 MED ORDER — SULFAMETHOXAZOLE-TRIMETHOPRIM 800-160 MG PO TABS: 1.0000 | ORAL_TABLET | Freq: Two times a day (BID) | ORAL | 0 refills | Status: DC

## 2017-05-05 NOTE — Assessment & Plan Note (Signed)
Accompanied by urethral pain or discomfort Patient experienced multiple clots throughout the day since yesterday.  I will obtain a CT abdomen and pelvis with and without contrast to evaluate both for kidney stones as well as for any masses. We will give her 1 L of normal saline IV. I prescribed Bactrim double strength 1 tablet by mouth twice a day for 7 days. Urinalysis shows moderate bacteria but no nitrate. Cultures are pending.

## 2017-05-05 NOTE — Progress Notes (Signed)
Patient Care Team: Orpah Melter, MD as PCP - General (Family Medicine) Excell Seltzer, MD as Consulting Physician (General Surgery) Magrinat, Virgie Dad, MD as Consulting Physician (Oncology) Eppie Gibson, MD as Attending Physician (Radiation Oncology) Huel Cote, NP as Nurse Practitioner (Obstetrics and Gynecology)  DIAGNOSIS:  Encounter Diagnoses  Name Primary?  . Malignant neoplasm of lower-outer quadrant of left breast of female, estrogen receptor positive ([place]) Yes  . Hematuria, unspecified type     SUMMARY OF ONCOLOGIC HISTORY:   Malignant neoplasm of lower-outer quadrant of left breast of female, estrogen receptor positive ([place])   03/01/2017 Initial Diagnosis    Left breast 2:30 position: 2.1 cm irregular solid mass, additional 0.8 cm oval mass in the left breast (fibrocystic); primary mass on biopsy revealed IDC grade 3, ER 100%, PR 80%, Ki-67 25%, HER-2 positive ratio 1.88, copy #6.2, T2 N0 stage II a clinical stage      03/29/2017 -  Neo-Adjuvant Chemotherapy    Neoadjuvant chemotherapy with Taxol Herceptin      04/27/2017 Genetic Testing    Negative genetic testing on the multi-gene panel.  The Multi-Gene Panel offered by [facility] includes sequencing and/or deletion duplication testing of the following 80 genes: ALK, APC, ATM, AXIN2,BAP1,  BARD1, BLM, BMPR1A, BRCA1, BRCA2, BRIP1, CASR, CDC73, CDH1, CDK4, CDKN1B, CDKN1C, CDKN2A (p14ARF), CDKN2A (p16INK4a), CEBPA, CHEK2, CTNNA1, DICER1, DIS3L2, EGFR (c.2369C>T, p.Thr790Met variant only), EPCAM (Deletion/duplication testing only), FH, FLCN, GATA2, GPC3, GREM1 (Promoter region deletion/duplication testing only), HOXB13 (c.251G>A, p.Gly84Glu), HRAS, KIT, MAX, MEN1, MET, MITF (c.952G>A, p.Glu318Lys variant only), MLH1, MSH2, MSH3, MSH6, MUTYH, NBN, NF1, NF2, NTHL1, PALB2, PDGFRA, PHOX2B, PMS2, POLD1, POLE, POT1, PRKAR1A, PTCH1, PTEN, RAD50, RAD51C, RAD51D, RB1, RECQL4, RET, RUNX1, SDHAF2, SDHA (sequence changes only), SDHB,  SDHC, SDHD, SMAD4, SMARCA4, SMARCB1, SMARCE1, STK11, SUFU, TERT, TERT, TMEM127, TP53, TSC1, TSC2, VHL, WRN and WT1.  The report date is April 27, 2017.        CHIEF COMPLIANT: Urgent visit for hematuria  INTERVAL HISTORY: Haley Sanchez is a 56 year old with above-mentioned history of left breast cancer currently on neoadjuvant chemotherapy with Taxol Herceptin. She came in urgently today because she noticed blood in the urine there were several clots as well that started last night and continued through the day. She was also having lot of urethral pain. She has been drinking lots of water. In spite of that she has been having this symptom. She does not have any flank pain.  REVIEW OF SYSTEMS:   Constitutional: Denies fevers, chills or abnormal weight loss Eyes: Denies blurriness of vision Ears, nose, mouth, throat, and face: Denies mucositis or sore throat Respiratory: Denies cough, dyspnea or wheezes Cardiovascular: Denies palpitation, chest discomfort Gastrointestinal:  Denies nausea, heartburn or change in bowel habits Skin: Denies abnormal skin rashes Lymphatics: Denies new lymphadenopathy or easy bruising Neurological:Denies numbness, tingling or new weaknesses Behavioral/Psych: Mood is stable, no new changes  Extremities: No lower extremity edema All other systems were reviewed with the patient and are negative.  I have reviewed the past medical history, past surgical history, social history and family history with the patient and they are unchanged from previous note.  ALLERGIES:  is allergic to no known allergies.  MEDICATIONS:  Current Outpatient Prescriptions  Medication Sig Dispense Refill  . buPROPion (WELLBUTRIN XL) 300 MG 24 hr tablet Take 300 mg by mouth daily.    . Coenzyme Q10 (COQ10) 100 MG CAPS Take 1 capsule by mouth daily.    . Cyanocobalamin (B-12) 1000 MCG  SUBL Place 1 tablet under the tongue daily.    Marland Kitchen HYDROcodone-acetaminophen (NORCO/VICODIN) 5-325 MG tablet  Take 1 tablet by mouth every 4 (four) hours as needed. 10 tablet 0  . lidocaine-prilocaine (EMLA) cream Apply to affected area once 30 g 3  . lisdexamfetamine (VYVANSE) 50 MG capsule Take 100 mg by mouth daily as needed (Take only when working).    Marland Kitchen losartan (COZAAR) 100 MG tablet Take 1 tablet by mouth daily.  0  . ondansetron (ZOFRAN) 8 MG tablet Take 1 tablet (8 mg total) by mouth 2 (two) times daily as needed (Nausea or vomiting). 30 tablet 1  . PARoxetine (PAXIL) 40 MG tablet Take 40 mg by mouth every morning.    . prochlorperazine (COMPAZINE) 10 MG tablet Take 1 tablet (10 mg total) by mouth every 6 (six) hours as needed (Nausea or vomiting). 30 tablet 1  . sulfamethoxazole-trimethoprim (BACTRIM DS,SEPTRA DS) 800-160 MG tablet Take 1 tablet by mouth 2 (two) times daily. 14 tablet 0   No current facility-administered medications for this visit.    Facility-Administered Medications Ordered in Other Visits  Medication Dose Route Frequency Provider Last Rate Last Dose  . iopamidol (ISOVUE-300) 61 % injection 30 mL  30 mL Oral Once PRN Nicholas Lose, MD        PHYSICAL EXAMINATION: ECOG PERFORMANCE STATUS: 1 - Symptomatic but completely ambulatory  Vitals:   05/05/17 1152  BP: (!) 152/91  Pulse: 83  Resp: 16  Temp: 98.7 F (37.1 C)   Filed Weights   05/05/17 1152  Weight: 166 lb 12.8 oz (75.7 kg)    GENERAL:alert, no distress and comfortable SKIN: skin color, texture, turgor are normal, no rashes or significant lesions EYES: normal, Conjunctiva are pink and non-injected, sclera clear OROPHARYNX:no exudate, no erythema and lips, buccal mucosa, and tongue normal  NECK: supple, thyroid normal size, non-tender, without nodularity LYMPH:  no palpable lymphadenopathy in the cervical, axillary or inguinal LUNGS: clear to auscultation and percussion with normal breathing effort HEART: regular rate & rhythm and no murmurs and no lower extremity edema ABDOMEN:abdomen soft, non-tender  and normal bowel sounds MUSCULOSKELETAL:no cyanosis of digits and no clubbing  NEURO: alert & oriented x 3 with fluent speech, no focal motor/sensory deficits EXTREMITIES: No lower extremity edema  LABORATORY DATA:  I have reviewed the data as listed   Chemistry      Component Value Date/Time   NA 143 05/05/2017 1115   K 3.6 05/05/2017 1115   CO2 28 05/05/2017 1115   BUN 13.4 05/05/2017 1115   CREATININE 1.0 05/05/2017 1115      Component Value Date/Time   CALCIUM 9.2 05/05/2017 1115   ALKPHOS 100 05/05/2017 1115   AST 21 05/05/2017 1115   ALT 48 05/05/2017 1115   BILITOT 0.36 05/05/2017 1115       Lab Results  Component Value Date   WBC 8.6 05/05/2017   HGB 13.1 05/05/2017   HCT 39.3 05/05/2017   MCV 93.1 05/05/2017   PLT 282 05/05/2017   NEUTROABS 6.8 (H) 05/05/2017    ASSESSMENT & PLAN:  Hematuria Accompanied by urethral pain or discomfort Patient experienced multiple clots throughout the day since yesterday.  I will obtain a CT abdomen and pelvis with and without contrast to evaluate both for kidney stones as well as for any masses. We will give her 1 L of normal saline IV. I prescribed Bactrim double strength 1 tablet by mouth twice a day for 7 days. Urinalysis shows moderate  bacteria but no nitrate. Cultures are pending.  Malignant neoplasm of lower-outer quadrant of left breast of female, estrogen receptor positive ([place]) Left breast 2:30 position: 2.1 cm irregular solid mass, additional 0.8 cm oval mass in the left breast (fibrocystic); primary mass on biopsy revealed IDC grade 3, ER 100%, PR 80%, Ki-67 25%, HER-2 positive ratio 1.88, copy #6.2, T2 N0 stage II a clinical stage  Treatment plan: 1. Neoadjuvant Taxol Herceptin weekly 12 followed by Herceptin maintenance for 6 months 2. breast conserving surgery with sentinel lymph node biopsy 3. Adjuvant radiation therapy 4. Followed by adjuvant antiestrogen  therapy ----------------------------------------------------------------------------------------------------------------------------------------- Current treatment: Taxol Herceptin completed cycle 6 Chemotherapy toxicities: 1 Mild nausea 2. decreased neutrophil count ANC 1.3 decreased the dosage of Taxol. Today's ANC is 1.8. Continue with the same dosage. 3. Mouth sores: Encouraged her to use salt water gargles. If it does not get better we can call in for Magic mouthwash. 4. Elevated AST: AST levels are stable at 59.     I spent 25 minutes talking to the patient of which more than half was spent in counseling and coordination of care.  No orders of the defined types were placed in this encounter.  The patient has a good understanding of the overall plan. she agrees with it. she will call with any problems that may develop before the next visit here.   Rulon Eisenmenger, MD 05/05/17

## 2017-05-05 NOTE — Telephone Encounter (Signed)
Haley Sanchez called from CT (still waiting for 430 scan). She was asking for pain medication. What was given to her in infusion worked. With discussion she has some norco at home left over from when port originally placed in June. It is the same as what was given in infusion. Instructed her to use that tonight if needed and call tomorrow if she needs some more.

## 2017-05-05 NOTE — Progress Notes (Signed)
Pt ordered CT abdomen pelvis w wo contrast. Obtained order from [facility]. Pt to pick up po antibiotics at her local pharmacy after her scans today. Dr.Gudena will call pt to let her know of her results. Pt verbalized understanding.  Escorted pt to [facility] for her CT scan today, however, pt did state that she had eaten lunch 1 hr ago. Pt previously was NPO in case she was to have some testing done. Awaiting to hear for further instructions on CT scans today.

## 2017-05-05 NOTE — Patient Instructions (Signed)
Dehydration, Adult Dehydration is when there is not enough fluid or water in your body. This happens when you lose more fluids than you take in. Dehydration can range from mild to very bad. It should be treated right away to keep it from getting very bad. Symptoms of mild dehydration may include:  Thirst.  Dry lips.  Slightly dry mouth.  Dry, warm skin.  Dizziness. Symptoms of moderate dehydration may include:  Very dry mouth.  Muscle cramps.  Dark pee (urine). Pee may be the color of tea.  Your body making less pee.  Your eyes making fewer tears.  Heartbeat that is uneven or faster than normal (palpitations).  Headache.  Light-headedness, especially when you stand up from sitting.  Fainting (syncope). Symptoms of very bad dehydration may include:  Changes in skin, such as: ? Cold and clammy skin. ? Blotchy (mottled) or pale skin. ? Skin that does not quickly return to normal after being lightly pinched and let go (poor skin turgor).  Changes in body fluids, such as: ? Feeling very thirsty. ? Your eyes making fewer tears. ? Not sweating when body temperature is high, such as in hot weather. ? Your body making very little pee.  Changes in vital signs, such as: ? Weak pulse. ? Pulse that is more than 100 beats a minute when you are sitting still. ? Fast breathing. ? Low blood pressure.  Other changes, such as: ? Sunken eyes. ? Cold hands and feet. ? Confusion. ? Lack of energy (lethargy). ? Trouble waking up from sleep. ? Short-term weight loss. ? Unconsciousness. Follow these instructions at home:  If told by your doctor, drink an ORS: ? Make an ORS by using instructions on the package. ? Start by drinking small amounts, about  cup (120 mL) every 5-10 minutes. ? Slowly drink more until you have had the amount that your doctor said to have.  Drink enough clear fluid to keep your pee clear or pale yellow. If you were told to drink an ORS, finish the ORS  first, then start slowly drinking clear fluids. Drink fluids such as: ? Water. Do not drink only water by itself. Doing that can make the salt (sodium) level in your body get too low (hyponatremia). ? Ice chips. ? Fruit juice that you have added water to (diluted). ? Low-calorie sports drinks.  Avoid: ? Alcohol. ? Drinks that have a lot of sugar. These include high-calorie sports drinks, fruit juice that does not have water added, and soda. ? Caffeine. ? Foods that are greasy or have a lot of fat or sugar.  Take over-the-counter and prescription medicines only as told by your doctor.  Do not take salt tablets. Doing that can make the salt level in your body get too high (hypernatremia).  Eat foods that have minerals (electrolytes). Examples include bananas, oranges, potatoes, tomatoes, and spinach.  Keep all follow-up visits as told by your doctor. This is important. Contact a doctor if:  You have belly (abdominal) pain that: ? Gets worse. ? Stays in one area (localizes).  You have a rash.  You have a stiff neck.  You get angry or annoyed more easily than normal (irritability).  You are more sleepy than normal.  You have a harder time waking up than normal.  You feel: ? Weak. ? Dizzy. ? Very thirsty.  You have peed (urinated) only a small amount of very dark pee during 6-8 hours. Get help right away if:  You have symptoms of   very bad dehydration.  You cannot drink fluids without throwing up (vomiting).  Your symptoms get worse with treatment.  You have a fever.  You have a very bad headache.  You are throwing up or having watery poop (diarrhea) and it: ? Gets worse. ? Does not go away.  You have blood or something green (bile) in your throw-up.  You have blood in your poop (stool). This may cause poop to look black and tarry.  You have not peed in 6-8 hours.  You pass out (faint).  Your heart rate when you are sitting still is more than 100 beats a  minute.  You have trouble breathing. This information is not intended to replace advice given to you by your health care provider. Make sure you discuss any questions you have with your health care provider. Document Released: 07/24/2009 Document Revised: 04/16/2016 Document Reviewed: 11/21/2015 Elsevier Interactive Patient Education  2018 [employer]  

## 2017-05-05 NOTE — Progress Notes (Signed)
Per Dr.Gudena, pt to have 1 L NS today. Notified charge nurse, Herbalist, in infusion and okay to add pt for today. Sent message to scheduling.

## 2017-05-05 NOTE — Telephone Encounter (Signed)
Pt called to report that since last night, pt started having hematuria with small to moderate clots (dark red blood). Pt also states that her vaginal wall burns a lot during urination. Pt denies any fevers, chills, back or flank pain at this time. Pt states that she may have a UTI. Pt states that she is very fatigue today and weak. She does report eating and drinking well since chemo this week. S/P Taxol/Herceptin. Told pt that we will need to have her come in for Urine culture and some blood work. Discussed with Dr.Gudena and have pt schedule an appt with him after labs are completed. Told pt to remain NPo in case Dr.Gudena orders any diagnostic testing today during her appt. Pt confirmed time/date of lab/md appt at 11am today.

## 2017-05-05 NOTE — Assessment & Plan Note (Signed)
Left breast 2:30 position: 2.1 cm irregular solid mass, additional 0.8 cm oval mass in the left breast (fibrocystic); primary mass on biopsy revealed IDC grade 3, ER 100%, PR 80%, Ki-67 25%, HER-2 positive ratio 1.88, copy #6.2, T2 N0 stage II a clinical stage  Treatment plan: 1. Neoadjuvant Taxol Herceptin weekly 12 followed by Herceptin maintenance for 6 months 2. breast conserving surgery with sentinel lymph node biopsy 3. Adjuvant radiation therapy 4. Followed by adjuvant antiestrogen therapy ----------------------------------------------------------------------------------------------------------------------------------------- Current treatment: Taxol Herceptin completed cycle 6 Chemotherapy toxicities: 1 Mild nausea 2. decreased neutrophil count ANC 1.3 decreased the dosage of Taxol. Today's ANC is 1.8. Continue with the same dosage. 3. Mouth sores: Encouraged her to use salt water gargles. If it does not get better we can call in for Magic mouthwash. 4. Elevated AST: AST levels are stable at 59.

## 2017-05-06 ENCOUNTER — Telehealth: Payer: Self-pay | Admitting: *Deleted

## 2017-05-06 ENCOUNTER — Other Ambulatory Visit: Payer: Self-pay | Admitting: Hematology and Oncology

## 2017-05-06 ENCOUNTER — Telehealth: Payer: Self-pay | Admitting: Hematology and Oncology

## 2017-05-06 DIAGNOSIS — N3090 Cystitis, unspecified without hematuria: Secondary | ICD-10-CM | POA: Insufficient documentation

## 2017-05-06 NOTE — Telephone Encounter (Signed)
I left a message that the CT scan shows evidence of inflammation/cystitis. I encouraged her to continue with antibiotics with Bactrim. I will request urology to see her urgently.

## 2017-05-06 NOTE — Telephone Encounter (Signed)
This RN contacted [facility] per MD referral for this pt to have an appointment ideally next week for new onset of hematuria while undergoing chemo.  Per contact this RN was transferred to the Triage line " they handle all urgent request " per operator - post being on hold x 15 minutes was directed to a VM.  This RN left a detailed message per need for an appointment per above with this RN's name and return call number for communication.

## 2017-05-07 LAB — URINE CULTURE

## 2017-05-09 ENCOUNTER — Encounter: Payer: Self-pay | Admitting: Hematology and Oncology

## 2017-05-09 ENCOUNTER — Telehealth: Payer: Self-pay | Admitting: *Deleted

## 2017-05-09 ENCOUNTER — Other Ambulatory Visit: Payer: Self-pay

## 2017-05-09 DIAGNOSIS — Z170 Estrogen receptor positive status [ER+]: Principal | ICD-10-CM

## 2017-05-09 DIAGNOSIS — C50512 Malignant neoplasm of lower-outer quadrant of left female breast: Secondary | ICD-10-CM

## 2017-05-09 DIAGNOSIS — R319 Hematuria, unspecified: Secondary | ICD-10-CM

## 2017-05-09 NOTE — Telephone Encounter (Signed)
Pt wanted to know about referral to Dr Amalia Hailey. Informed her the referral is in process.

## 2017-05-09 NOTE — Progress Notes (Signed)
Called Dr.Robert Evans office to make urgent referral within 1 week. Pt refused to go to [facility] at this time for the referral. Pt scheduled for Urology appt on 05/16/17 at 1030am. Called pt LVM with information about appt and call back number to modify appt if needed. Pt appt set up in [place] office due to no availability in the [place] office for incoming new pt.

## 2017-05-09 NOTE — Telephone Encounter (Signed)
Notified HIM to process referral as soon as possible.

## 2017-05-09 NOTE — Telephone Encounter (Addendum)
"  Referral to urologist needs to be made as soon as possible to Dr. Alona Bene, [facility] office number is [phone].  I do not want to see anyone with the [facility].  Was notified of Dr. Alger Simons who is with [facility] but do not want this.  Have been in pain since Thursday.  I do not have a urinary tract infection ut it feels like it.  Return number if any questions [phone]."

## 2017-05-10 ENCOUNTER — Emergency Department (HOSPITAL_COMMUNITY)
Admission: EM | Admit: 2017-05-10 | Discharge: 2017-05-10 | Disposition: A | Discharge status: Home / Self Care | Payer: BC Managed Care – PPO | Attending: Emergency Medicine | Admitting: Emergency Medicine

## 2017-05-10 ENCOUNTER — Telehealth: Payer: Self-pay | Admitting: *Deleted

## 2017-05-10 ENCOUNTER — Other Ambulatory Visit (HOSPITAL_BASED_OUTPATIENT_CLINIC_OR_DEPARTMENT_OTHER): Payer: BC Managed Care – PPO

## 2017-05-10 ENCOUNTER — Encounter: Payer: Self-pay | Admitting: Hematology and Oncology

## 2017-05-10 ENCOUNTER — Ambulatory Visit (HOSPITAL_BASED_OUTPATIENT_CLINIC_OR_DEPARTMENT_OTHER): Payer: BC Managed Care – PPO | Admitting: Hematology and Oncology

## 2017-05-10 ENCOUNTER — Encounter (HOSPITAL_COMMUNITY): Payer: Self-pay | Admitting: *Deleted

## 2017-05-10 ENCOUNTER — Ambulatory Visit (HOSPITAL_BASED_OUTPATIENT_CLINIC_OR_DEPARTMENT_OTHER): Payer: BC Managed Care – PPO

## 2017-05-10 ENCOUNTER — Ambulatory Visit: Payer: BC Managed Care – PPO

## 2017-05-10 VITALS — BP 127/80

## 2017-05-10 DIAGNOSIS — Z170 Estrogen receptor positive status [ER+]: Secondary | ICD-10-CM

## 2017-05-10 DIAGNOSIS — Z5112 Encounter for antineoplastic immunotherapy: Secondary | ICD-10-CM

## 2017-05-10 DIAGNOSIS — J45909 Unspecified asthma, uncomplicated: Secondary | ICD-10-CM | POA: Insufficient documentation

## 2017-05-10 DIAGNOSIS — R1032 Left lower quadrant pain: Secondary | ICD-10-CM | POA: Insufficient documentation

## 2017-05-10 DIAGNOSIS — R310 Gross hematuria: Secondary | ICD-10-CM

## 2017-05-10 DIAGNOSIS — I10 Essential (primary) hypertension: Secondary | ICD-10-CM | POA: Diagnosis not present

## 2017-05-10 DIAGNOSIS — C50512 Malignant neoplasm of lower-outer quadrant of left female breast: Secondary | ICD-10-CM

## 2017-05-10 DIAGNOSIS — K625 Hemorrhage of anus and rectum: Secondary | ICD-10-CM | POA: Insufficient documentation

## 2017-05-10 DIAGNOSIS — Z79899 Other long term (current) drug therapy: Secondary | ICD-10-CM | POA: Diagnosis not present

## 2017-05-10 DIAGNOSIS — Z95828 Presence of other vascular implants and grafts: Secondary | ICD-10-CM

## 2017-05-10 LAB — CBC WITH DIFFERENTIAL/PLATELET
BASO%: 0.6 % (ref 0.0–2.0)
BASOS ABS: 0 10*3/uL (ref 0.0–0.1)
EOS ABS: 0.1 10*3/uL (ref 0.0–0.5)
EOS%: 3.1 % (ref 0.0–7.0)
HEMATOCRIT: 36 % (ref 34.8–46.6)
HGB: 11.9 g/dL (ref 11.6–15.9)
LYMPH#: 1.3 10*3/uL (ref 0.9–3.3)
LYMPH%: 37.1 % (ref 14.0–49.7)
MCH: 30.8 pg (ref 25.1–34.0)
MCHC: 33.1 g/dL (ref 31.5–36.0)
MCV: 93.3 fL (ref 79.5–101.0)
MONO#: 0.3 10*3/uL (ref 0.1–0.9)
MONO%: 7.6 % (ref 0.0–14.0)
NEUT%: 51.6 % (ref 38.4–76.8)
NEUTROS ABS: 1.8 10*3/uL (ref 1.5–6.5)
Platelets: 239 10*3/uL (ref 145–400)
RBC: 3.86 10*6/uL (ref 3.70–5.45)
RDW: 14.2 % (ref 11.2–14.5)
WBC: 3.5 10*3/uL — AB (ref 3.9–10.3)

## 2017-05-10 LAB — COMPREHENSIVE METABOLIC PANEL
ALK PHOS: 112 U/L (ref 40–150)
ALT: 28 U/L (ref 0–55)
ANION GAP: 9 meq/L (ref 3–11)
AST: 18 U/L (ref 5–34)
Albumin: 3.6 g/dL (ref 3.5–5.0)
BUN: 17.2 mg/dL (ref 7.0–26.0)
CHLORIDE: 106 meq/L (ref 98–109)
CO2: 24 meq/L (ref 22–29)
Calcium: 9 mg/dL (ref 8.4–10.4)
Creatinine: 1.1 mg/dL (ref 0.6–1.1)
EGFR: 59 mL/min/{1.73_m2} — ABNORMAL LOW (ref >90)
Glucose: 92 mg/dl (ref 70–140)
POTASSIUM: 4.2 meq/L (ref 3.5–5.1)
SODIUM: 139 meq/L (ref 136–145)
Total Bilirubin: 0.24 mg/dL (ref 0.20–1.20)
Total Protein: 6.3 g/dL — ABNORMAL LOW (ref 6.4–8.3)

## 2017-05-10 MED ORDER — METRONIDAZOLE 500 MG PO TABS
500.0000 mg | ORAL_TABLET | Freq: Once | ORAL | Status: AC
  Administered 2017-05-10: 500 mg via ORAL
  Filled 2017-05-10: qty 1

## 2017-05-10 MED ORDER — METRONIDAZOLE 500 MG PO TABS: 500.0000 mg | ORAL_TABLET | Freq: Two times a day (BID) | ORAL | 0 refills | Status: DC

## 2017-05-10 MED ORDER — DIPHENHYDRAMINE HCL 50 MG/ML IJ SOLN
25.0000 mg | Freq: Once | INTRAMUSCULAR | Status: AC
  Administered 2017-05-10: 25 mg via INTRAVENOUS

## 2017-05-10 MED ORDER — SODIUM CHLORIDE 0.9 % IV SOLN
150.0000 mg | Freq: Once | INTRAVENOUS | Status: AC
  Administered 2017-05-10: 150 mg via INTRAVENOUS
  Filled 2017-05-10: qty 7.14

## 2017-05-10 MED ORDER — SODIUM CHLORIDE 0.9% FLUSH
10.0000 mL | Freq: Once | INTRAVENOUS | Status: AC
  Administered 2017-05-10: 10 mL
  Filled 2017-05-10: qty 10

## 2017-05-10 MED ORDER — CIPROFLOXACIN HCL 500 MG PO TABS: 500.0000 mg | ORAL_TABLET | Freq: Two times a day (BID) | ORAL | 0 refills | Status: DC

## 2017-05-10 MED ORDER — HEPARIN SOD (PORK) LOCK FLUSH 100 UNIT/ML IV SOLN
500.0000 [IU] | Freq: Once | INTRAVENOUS | Status: AC | PRN
  Administered 2017-05-10: 500 [IU]
  Filled 2017-05-10: qty 5

## 2017-05-10 MED ORDER — SODIUM CHLORIDE 0.9% FLUSH
10.0000 mL | INTRAVENOUS | Status: DC | PRN
  Administered 2017-05-10: 10 mL
  Filled 2017-05-10: qty 10

## 2017-05-10 MED ORDER — DIPHENHYDRAMINE HCL 50 MG/ML IJ SOLN
INTRAMUSCULAR | Status: AC
  Filled 2017-05-10: qty 1

## 2017-05-10 MED ORDER — SODIUM CHLORIDE 0.9 % IV SOLN
Freq: Once | INTRAVENOUS | Status: AC
  Administered 2017-05-10: 10:00:00 via INTRAVENOUS

## 2017-05-10 MED ORDER — CIPROFLOXACIN HCL 500 MG PO TABS
500.0000 mg | ORAL_TABLET | Freq: Once | ORAL | Status: AC
  Administered 2017-05-10: 500 mg via ORAL
  Filled 2017-05-10: qty 1

## 2017-05-10 MED ORDER — ACETAMINOPHEN 325 MG PO TABS
650.0000 mg | ORAL_TABLET | Freq: Once | ORAL | Status: AC
  Administered 2017-05-10: 650 mg via ORAL

## 2017-05-10 MED ORDER — ACETAMINOPHEN 325 MG PO TABS
ORAL_TABLET | ORAL | Status: AC
  Filled 2017-05-10: qty 2

## 2017-05-10 NOTE — Assessment & Plan Note (Signed)
Left breast 2:30 position: 2.1 cm irregular solid mass, additional 0.8 cm oval mass in the left breast (fibrocystic); primary mass on biopsy revealed IDC grade 3, ER 100%, PR 80%, Ki-67 25%, HER-2 positive ratio 1.88, copy #6.2, T2 N0 stage II a clinical stage  Treatment plan: 1. Neoadjuvant Taxol Herceptin weekly 12 followed by Herceptin maintenance for 6 months 2. breast conserving surgery with sentinel lymph node biopsy 3. Adjuvant radiation therapy 4. Followed by adjuvant antiestrogen therapy ----------------------------------------------------------------------------------------------------------------------------------------- Current treatment: Taxol Herceptin today is cycle 7 Chemotherapy toxicities: 1 Mild nausea 2. decreasedneutrophil count ANC 1.3 decreasedthe dosage of Taxol. Today's ANC is 1.8. Continue with the same dosage. 3. Mouth sores: Encouraged her to use salt water gargles. If it does not get better we can call in for Magic mouthwash. 4. Elevated AST: AST levels are stable at 59. 5. Severe hematuria: CT of the abdomen 05/05/2017 revealed circumferential urinary bladder wall thickening with associated inflammatory changes compatible with cystitis Patient received Bactrim antibiotics Awaiting urology referral  Return to clinic in 2 weeks for follow-up and weekly for chemotherapy.

## 2017-05-10 NOTE — Progress Notes (Signed)
Patient Care Team: Orpah Melter, MD as PCP - General (Family Medicine) Excell Seltzer, MD as Consulting Physician (General Surgery) Magrinat, Virgie Dad, MD as Consulting Physician (Oncology) Eppie Gibson, MD as Attending Physician (Radiation Oncology) Huel Cote, NP as Nurse Practitioner (Obstetrics and Gynecology)  DIAGNOSIS:  Encounter Diagnosis  Name Primary?  . Malignant neoplasm of lower-outer quadrant of left breast of female, estrogen receptor positive ([place])     SUMMARY OF ONCOLOGIC HISTORY:   Malignant neoplasm of lower-outer quadrant of left breast of female, estrogen receptor positive ([place])   03/01/2017 Initial Diagnosis    Left breast 2:30 position: 2.1 cm irregular solid mass, additional 0.8 cm oval mass in the left breast (fibrocystic); primary mass on biopsy revealed IDC grade 3, ER 100%, PR 80%, Ki-67 25%, HER-2 positive ratio 1.88, copy #6.2, T2 N0 stage II a clinical stage      03/29/2017 -  Neo-Adjuvant Chemotherapy    Neoadjuvant chemotherapy with Taxol Herceptin      04/27/2017 Genetic Testing    Negative genetic testing on the multi-gene panel.  The Multi-Gene Panel offered by [facility] includes sequencing and/or deletion duplication testing of the following 80 genes: ALK, APC, ATM, AXIN2,BAP1,  BARD1, BLM, BMPR1A, BRCA1, BRCA2, BRIP1, CASR, CDC73, CDH1, CDK4, CDKN1B, CDKN1C, CDKN2A (p14ARF), CDKN2A (p16INK4a), CEBPA, CHEK2, CTNNA1, DICER1, DIS3L2, EGFR (c.2369C>T, p.Thr790Met variant only), EPCAM (Deletion/duplication testing only), FH, FLCN, GATA2, GPC3, GREM1 (Promoter region deletion/duplication testing only), HOXB13 (c.251G>A, p.Gly84Glu), HRAS, KIT, MAX, MEN1, MET, MITF (c.952G>A, p.Glu318Lys variant only), MLH1, MSH2, MSH3, MSH6, MUTYH, NBN, NF1, NF2, NTHL1, PALB2, PDGFRA, PHOX2B, PMS2, POLD1, POLE, POT1, PRKAR1A, PTCH1, PTEN, RAD50, RAD51C, RAD51D, RB1, RECQL4, RET, RUNX1, SDHAF2, SDHA (sequence changes only), SDHB, SDHC, SDHD, SMAD4, SMARCA4, SMARCB1,  SMARCE1, STK11, SUFU, TERT, TERT, TMEM127, TP53, TSC1, TSC2, VHL, WRN and WT1.  The report date is April 27, 2017.        CHIEF COMPLIANT: Hematuria, urinary discomfort, cystitis, Taxol Herceptin, Taxol being held today  INTERVAL HISTORY: Haley Sanchez is a 56 year old with above-mentioned history of left breast cancer currently on neoadjuvant chemotherapy with Taxol and Herceptin. She has had severe hematuria for which we obtained a CT of her abdomen pelvis. It showed moderate bladder wall thickening suggestive of cystitis. We put her on Bactrim and obtain urinalysis and cultures. Urine culture grew Escherichia coli that is sensitive to antibiotics. She reports that the hematuria has subsided. She continues to have urinary discomfort through the urethra.  REVIEW OF SYSTEMS:   Constitutional: Denies fevers, chills or abnormal weight loss Eyes: Denies blurriness of vision Ears, nose, mouth, throat, and face: Denies mucositis or sore throat Respiratory: Denies cough, dyspnea or wheezes Cardiovascular: Denies palpitation, chest discomfort Gastrointestinal:  Denies nausea, heartburn or change in bowel habits Skin: Denies abnormal skin rashes Lymphatics: Denies new lymphadenopathy or easy bruising Neurological:Denies numbness, tingling or new weaknesses Behavioral/Psych: Mood is stable, no new changes  Extremities: No lower extremity edema  All other systems were reviewed with the patient and are negative.  I have reviewed the past medical history, past surgical history, social history and family history with the patient and they are unchanged from previous note.  ALLERGIES:  is allergic to no known allergies.  MEDICATIONS:  Current Outpatient Prescriptions  Medication Sig Dispense Refill  . buPROPion (WELLBUTRIN XL) 300 MG 24 hr tablet Take 300 mg by mouth daily.    . Coenzyme Q10 (COQ10) 100 MG CAPS Take 1 capsule by mouth daily.    . Cyanocobalamin (  B-12) 1000 MCG SUBL Place 1 tablet  under the tongue daily.    Marland Kitchen HYDROcodone-acetaminophen (NORCO/VICODIN) 5-325 MG tablet Take 1 tablet by mouth every 4 (four) hours as needed. 10 tablet 0  . lidocaine-prilocaine (EMLA) cream Apply to affected area once 30 g 3  . lisdexamfetamine (VYVANSE) 50 MG capsule Take 100 mg by mouth daily as needed (Take only when working).    Marland Kitchen losartan (COZAAR) 100 MG tablet Take 1 tablet by mouth daily.  0  . ondansetron (ZOFRAN) 8 MG tablet Take 1 tablet (8 mg total) by mouth 2 (two) times daily as needed (Nausea or vomiting). 30 tablet 1  . PARoxetine (PAXIL) 40 MG tablet Take 40 mg by mouth every morning.    . prochlorperazine (COMPAZINE) 10 MG tablet Take 1 tablet (10 mg total) by mouth every 6 (six) hours as needed (Nausea or vomiting). 30 tablet 1  . sulfamethoxazole-trimethoprim (BACTRIM DS,SEPTRA DS) 800-160 MG tablet Take 1 tablet by mouth 2 (two) times daily. 14 tablet 0   No current facility-administered medications for this visit.     PHYSICAL EXAMINATION: ECOG PERFORMANCE STATUS: 1 - Symptomatic but completely ambulatory  Vitals:   05/10/17 0823  BP: (!) 152/91  Pulse: 82  Resp: 18  Temp: 98.3 F (36.8 C)   Filed Weights   05/10/17 0823  Weight: 171 lb 9.6 oz (77.8 kg)    GENERAL:alert, no distress and comfortable SKIN: skin color, texture, turgor are normal, no rashes or significant lesions EYES: normal, Conjunctiva are pink and non-injected, sclera clear OROPHARYNX:no exudate, no erythema and lips, buccal mucosa, and tongue normal  NECK: supple, thyroid normal size, non-tender, without nodularity LYMPH:  no palpable lymphadenopathy in the cervical, axillary or inguinal LUNGS: clear to auscultation and percussion with normal breathing effort HEART: regular rate & rhythm and no murmurs and no lower extremity edema ABDOMEN:abdomen soft, non-tender and normal bowel sounds MUSCULOSKELETAL:no cyanosis of digits and no clubbing  NEURO: alert & oriented x 3 with fluent speech,  no focal motor/sensory deficits EXTREMITIES: No lower extremity edema  LABORATORY DATA:  I have reviewed the data as listed   Chemistry      Component Value Date/Time   NA 139 05/10/2017 0741   K 4.2 05/10/2017 0741   CO2 24 05/10/2017 0741   BUN 17.2 05/10/2017 0741   CREATININE 1.1 05/10/2017 0741      Component Value Date/Time   CALCIUM 9.0 05/10/2017 0741   ALKPHOS 112 05/10/2017 0741   AST 18 05/10/2017 0741   ALT 28 05/10/2017 0741   BILITOT 0.24 05/10/2017 0741       Lab Results  Component Value Date   WBC 3.5 (L) 05/10/2017   HGB 11.9 05/10/2017   HCT 36.0 05/10/2017   MCV 93.3 05/10/2017   PLT 239 05/10/2017   NEUTROABS 1.8 05/10/2017    ASSESSMENT & PLAN:  Malignant neoplasm of lower-outer quadrant of left breast of female, estrogen receptor positive ([facility]) Left breast 2:30 position: 2.1 cm irregular solid mass, additional 0.8 cm oval mass in the left breast (fibrocystic); primary mass on biopsy revealed IDC grade 3, ER 100%, PR 80%, Ki-67 25%, HER-2 positive ratio 1.88, copy #6.2, T2 N0 stage II a clinical stage  Treatment plan: 1. Neoadjuvant Taxol Herceptin weekly 12 followed by Herceptin maintenance for 6 months 2. breast conserving surgery with sentinel lymph node biopsy 3. Adjuvant radiation therapy 4. Followed by adjuvant antiestrogen therapy ----------------------------------------------------------------------------------------------------------------------------------------- Current treatment: Taxol Herceptin today is cycle 7 (being held  for cystitis) Chemotherapy toxicities: 1 Mild nausea 2. decreasedneutrophil count ANC 1.3 decreasedthe dosage of Taxol. Today's ANC is 1.8. Continue with the same dosage. 3. Mouth sores: Encouraged her to use salt water gargles. If it does not get better we can call in for Magic mouthwash. 4. Elevated AST: AST levels are stable at 59. 5. Severe hematuria: CT of the abdomen 05/05/2017 revealed circumferential  urinary bladder wall thickening with associated inflammatory changes compatible with cystitis Cultures revealed Escherichia coli sensitive to Bactrim Patient received Bactrim antibiotic Urology appointment is on Monday.  Next week when she comes back after urology appointment we will make a decision on whether or not we can resume Taxol. Return to clinic in 1 week for follow-up and weekly for chemotherapy.  I spent 25 minutes talking to the patient of which more than half was spent in counseling and coordination of care.  No orders of the defined types were placed in this encounter.  The patient has a good understanding of the overall plan. she agrees with it. she will call with any problems that may develop before the next visit here.   Rulon Eisenmenger, MD 05/10/17

## 2017-05-10 NOTE — Telephone Encounter (Addendum)
FYI "Dr. Lindi Adie is aware of bleeding from my bladder but I need to make him aware of rectal bleeding that's started since I left today.  Just went to the bathroom.  Noticed bright red rectal bleeding that's more than a hemmorhoid.  Blood sank to bottom of commode, it's on the tissue and there are four spots the size of fifty cent pieces in the seat of my under garments and pants.  Last colonoscopy was done in my twenties due to ulcers."  Nurse Instructions to report to ED.  "Today?   Now?"  Again advised to go to the ED for rectal bleeding.  Dr. Lindi Adie notified.  Shared with patient the verbal order received and read back from Dr. Lindi Adie, nurse instructions to go to the ED is appropriate.   Noted 1925 arrival to ED.

## 2017-05-10 NOTE — ED Provider Notes (Signed)
[facility] DEPT Provider Note   CSN: [id number] Arrival date & time: 05/10/17  1925     History   Chief Complaint Chief Complaint  Patient presents with  . Rectal Bleeding    chemo pt    HPI Haley Sanchez is a 56 y.o. female.  Patient presents to the ED with a chief complaint of rectal bleeding.  She states that she had one episode of rectal bleeding today.  It has since stopped.  She states that she called her doctor and was advised to come to the ED for assessment.  She states that she didn't really want to come, and states that she would like to go home and be discharged with GI follow-up since her symptoms have ceased.  She states that she is willing to do whatever she needs to though.  She reports that she has some LLQ pain which has been present for the past 2-3 days.  She is being treated for cystitis with Bactrim.  She states that she has not had any improvement of her symptoms.  She denies any fevers, chills, nausea, or vomiting.  There are no other associated symptoms.   The history is provided by the patient. No language interpreter was used.    Past Medical History:  Diagnosis Date  . Anxiety   . Asthma   . Cancer [facility])    breast cancer  . Depression   . Family history of breast cancer   . Family history of prostate cancer   . Hypertension   . Seasonal allergies     Patient Active Problem List   Diagnosis Date Noted  . Cystitis 05/06/2017  . Hematuria 05/05/2017  . Genetic testing 04/29/2017  . Family history of breast cancer   . Family history of prostate cancer   . Port catheter in place 04/05/2017  . Malignant neoplasm of lower-outer quadrant of left breast of female, estrogen receptor positive ([place]) 03/03/2017    Past Surgical History:  Procedure Laterality Date  . PORTACATH PLACEMENT Right 03/28/2017   Procedure: INSERTION PORT-A-CATH WITH [place];  Surgeon: Rolm Bookbinder, MD;  Location: [facility];  Service: General;  Laterality: Right;    OB  History    Gravida Para Term Preterm AB Living   3       1 3    SAB TAB Ectopic Multiple Live Births       0           Home Medications    Prior to Admission medications   Medication Sig Start Date End Date Taking? Authorizing Provider  albuterol (PROVENTIL HFA;VENTOLIN HFA) 108 (90 Base) MCG/ACT inhaler Inhale 2 puffs into the lungs every 6 (six) hours as needed for wheezing or shortness of breath.   Yes [provider]  buPROPion (WELLBUTRIN XL) 300 MG 24 hr tablet Take 300 mg by mouth daily.   Yes [provider]  HYDROcodone-acetaminophen (NORCO/VICODIN) 5-325 MG tablet Take 1 tablet by mouth every 4 (four) hours as needed. Patient taking differently: Take 1 tablet by mouth every 4 (four) hours as needed for moderate pain.  03/28/17  Yes Rolm Bookbinder, MD  lidocaine-prilocaine (EMLA) cream Apply to affected area once Patient taking differently: Apply 1 application topically daily as needed. Apply to affected area once. Port Access 03/14/17  Yes Nicholas Lose, MD  LORazepam (ATIVAN) 0.5 MG tablet Take 0.5 mg by mouth every 8 (eight) hours as needed for anxiety.  03/14/17  Yes [provider]  losartan (COZAAR) 100  MG tablet Take 100 mg by mouth daily.  02/04/17  Yes [provider]  ondansetron (ZOFRAN) 8 MG tablet Take 1 tablet (8 mg total) by mouth 2 (two) times daily as needed (Nausea or vomiting). 03/14/17  Yes Nicholas Lose, MD  PARoxetine (PAXIL) 40 MG tablet Take 40 mg by mouth every evening.    Yes [provider]  Phenazopyridine HCl (AZO DINE MAXIMUM STRENGTH) 97.5 MG TABS Take 2 tablets by mouth every 8 (eight) hours as needed. Pain   Yes [provider]  prochlorperazine (COMPAZINE) 10 MG tablet Take 1 tablet (10 mg total) by mouth every 6 (six) hours as needed (Nausea or vomiting). 03/14/17  Yes Nicholas Lose, MD  ciprofloxacin (CIPRO) 500 MG tablet Take 1 tablet (500 mg total) by mouth 2 (two) times daily. 05/10/17   Montine Circle, PA-C  metroNIDAZOLE (FLAGYL) 500 MG tablet Take 1 tablet (500 mg total) by mouth 2 (two) times daily. 05/10/17   Montine Circle, PA-C    Family History Family History  Problem Relation Age of Onset  . Heart disease Mother   . Hypertension Mother   . Kidney failure Father   . Diabetes Father   . Hypertension Father   . Prostate cancer Father 6  . Brain cancer Maternal Grandmother 26  . Prostate cancer Maternal Grandfather        dx in his 34s  . Breast cancer Paternal Grandmother   . Alcohol abuse Maternal Aunt   . Other Maternal Uncle        died in [place]  . Breast cancer Cousin        maternal first cousin dx in her 7s  . Mental retardation Cousin   . Breast cancer Other        maternal second cousin  . Breast cancer Cousin        paternal first cousin  . Breast cancer Cousin        paternal first cousin    Social History Social History  Substance Use Topics  . Smoking status: Never Smoker  . Smokeless tobacco: Never Used  . Alcohol use No     Allergies   No known allergies   Review of Systems Review of Systems  All other systems reviewed and are negative.    Physical Exam Updated Vital Signs BP (!) 146/85   Pulse 71   Temp 98.3 F (36.8 C) (Oral)   Resp 18   SpO2 99%   Physical Exam  Constitutional: She is oriented to person, place, and time. She appears well-developed and well-nourished.  HENT:  Head: Normocephalic and atraumatic.  Eyes: Pupils are equal, round, and reactive to light. Conjunctivae and EOM are normal.  Neck: Normal range of motion. Neck supple.  Cardiovascular: Normal rate and regular rhythm.  Exam reveals no gallop and no friction rub.   No murmur heard. Pulmonary/Chest: Effort normal and breath sounds normal. No respiratory distress. She has no wheezes. She has no rales. She exhibits no tenderness.  Abdominal: Soft. Bowel sounds are normal. She exhibits no distension and no mass. There is tenderness. There is no rebound  and no guarding.  Moderate LLQ TTP  Musculoskeletal: Normal range of motion. She exhibits no edema or tenderness.  Neurological: She is alert and oriented to person, place, and time.  Skin: Skin is warm and dry.  Psychiatric: She has a normal mood and affect. Her behavior is normal. Judgment and thought content normal.  Nursing note and vitals reviewed.  ED Treatments / Results  Labs (all labs ordered are listed, but only abnormal results are displayed) Labs Reviewed - No data to display  EKG  EKG Interpretation None       Radiology No results found.  Procedures Procedures (including critical care time)  Medications Ordered in ED Medications  ciprofloxacin (CIPRO) tablet 500 mg (500 mg Oral Given 05/10/17 2332)  metroNIDAZOLE (FLAGYL) tablet 500 mg (500 mg Oral Given 05/10/17 2332)     Initial Impression / Assessment and Plan / ED Course  I have reviewed the triage vital signs and the nursing notes.  Pertinent labs & imaging results that were available during my care of the patient were reviewed by me and considered in my medical decision making (see chart for details).     Patient with one episode of rectal bleeding.  No dizziness, SOB, CP.  Not anticoagulated.  Does have her port flushed with heparin, but is not on any other anticoagulation.  She states that she has not improved with bactrim for her cystitis.  She has a recent urine culture from 7/26 which is essentially pan sensitive.  She also had a CT from that date that showed cystitis, but also shows some diverticular disease, but without inflammation.  Given her unresolved symptoms with some LLQ tenderness and some bloody/mucoid stools, she may benefit from being covered for diverticulitis.  I will cover her with cipro/flagyl.  This will also cover her cystitis. I have strongly advised her to return if her symptoms worsen.  She states that she would prefer not to have repeat labs or imaging done.  Based on her stable  VSS and well appearance, I believe that she can be discharged with GI follow-up.  Patient understands to return to the ER if symptoms worsen.  She will return if needed.  She agrees to follow-up as directed.  Final Clinical Impressions(s) / ED Diagnoses   Final diagnoses:  Rectal bleeding  Abdominal pain, acute, left lower quadrant    New Prescriptions New Prescriptions   CIPROFLOXACIN (CIPRO) 500 MG TABLET    Take 1 tablet (500 mg total) by mouth 2 (two) times daily.   METRONIDAZOLE (FLAGYL) 500 MG TABLET    Take 1 tablet (500 mg total) by mouth 2 (two) times daily.     Montine Circle, PA-C 05/10/17 2345    Carmin Muskrat, MD 05/11/17 Quentin Mulling

## 2017-05-10 NOTE — ED Triage Notes (Signed)
Pt reports rectal bleeding at 1630 today. Pt states she noticed bright red blood on the tissue paper and in the toilet. Pt states she had heparin at the cancer center today while receiving treatment for breast cancer.

## 2017-05-10 NOTE — Patient Instructions (Signed)

## 2017-05-10 NOTE — Discharge Instructions (Signed)
Your recent CT shows evidence of diverticular disease WITHOUT evidence of inflammation or infection.  However, with your left lower abdominal pain, now with some bloody stools, it may be beneficial to broaden your antibiotic coverage to include diverticulitis.      It is recommended that you follow-up with your oncologist and that you follow-up with a gastroenterologist and get a colonoscopy.  Please return for any worsening symptoms.

## 2017-05-10 NOTE — Patient Instructions (Signed)
[facility] Discharge Instructions for Patients Receiving Chemotherapy  Today you received the following chemotherapy agents:  Herceptin  To help prevent nausea and vomiting after your treatment, we encourage you to take your nausea medication as prescribed.   If you develop nausea and vomiting that is not controlled by your nausea medication, call the clinic.   BELOW ARE SYMPTOMS THAT SHOULD BE REPORTED IMMEDIATELY:  *FEVER GREATER THAN 100.5 F  *CHILLS WITH OR WITHOUT FEVER  NAUSEA AND VOMITING THAT IS NOT CONTROLLED WITH YOUR NAUSEA MEDICATION  *UNUSUAL SHORTNESS OF BREATH  *UNUSUAL BRUISING OR BLEEDING  TENDERNESS IN MOUTH AND THROAT WITH OR WITHOUT PRESENCE OF ULCERS  *URINARY PROBLEMS  *BOWEL PROBLEMS  UNUSUAL RASH Items with * indicate a potential emergency and should be followed up as soon as possible.  Feel free to call the clinic you have any questions or concerns. The clinic phone number is [phone].  Please show the CHEMO ALERT CARD at check-in to the Emergency Department and triage nurse.   

## 2017-05-10 NOTE — ED Notes (Signed)
Pt states she prefers not to have blood work done now. She wants to schedule appointment with GI.

## 2017-05-17 ENCOUNTER — Ambulatory Visit: Payer: BC Managed Care – PPO

## 2017-05-17 ENCOUNTER — Other Ambulatory Visit (HOSPITAL_BASED_OUTPATIENT_CLINIC_OR_DEPARTMENT_OTHER): Payer: BC Managed Care – PPO

## 2017-05-17 ENCOUNTER — Encounter: Payer: Self-pay | Admitting: Hematology and Oncology

## 2017-05-17 ENCOUNTER — Ambulatory Visit (HOSPITAL_BASED_OUTPATIENT_CLINIC_OR_DEPARTMENT_OTHER): Payer: BC Managed Care – PPO | Admitting: Hematology and Oncology

## 2017-05-17 ENCOUNTER — Encounter: Payer: Self-pay | Admitting: *Deleted

## 2017-05-17 ENCOUNTER — Ambulatory Visit (HOSPITAL_BASED_OUTPATIENT_CLINIC_OR_DEPARTMENT_OTHER): Payer: BC Managed Care – PPO

## 2017-05-17 DIAGNOSIS — Z5112 Encounter for antineoplastic immunotherapy: Secondary | ICD-10-CM | POA: Diagnosis not present

## 2017-05-17 DIAGNOSIS — C50512 Malignant neoplasm of lower-outer quadrant of left female breast: Secondary | ICD-10-CM | POA: Diagnosis not present

## 2017-05-17 DIAGNOSIS — Z170 Estrogen receptor positive status [ER+]: Principal | ICD-10-CM

## 2017-05-17 DIAGNOSIS — Z5111 Encounter for antineoplastic chemotherapy: Secondary | ICD-10-CM

## 2017-05-17 DIAGNOSIS — Z95828 Presence of other vascular implants and grafts: Secondary | ICD-10-CM

## 2017-05-17 LAB — COMPREHENSIVE METABOLIC PANEL
ALT: 77 U/L — AB (ref 0–55)
ANION GAP: 8 meq/L (ref 3–11)
AST: 35 U/L — ABNORMAL HIGH (ref 5–34)
Albumin: 3.7 g/dL (ref 3.5–5.0)
Alkaline Phosphatase: 103 U/L (ref 40–150)
BUN: 12.3 mg/dL (ref 7.0–26.0)
CHLORIDE: 106 meq/L (ref 98–109)
CO2: 28 meq/L (ref 22–29)
CREATININE: 1 mg/dL (ref 0.6–1.1)
Calcium: 9.1 mg/dL (ref 8.4–10.4)
EGFR: 67 mL/min/{1.73_m2} — AB (ref >90)
GLUCOSE: 80 mg/dL (ref 70–140)
Potassium: 4.2 mEq/L (ref 3.5–5.1)
SODIUM: 142 meq/L (ref 136–145)
TOTAL PROTEIN: 6.4 g/dL (ref 6.4–8.3)
Total Bilirubin: 0.33 mg/dL (ref 0.20–1.20)

## 2017-05-17 LAB — CBC WITH DIFFERENTIAL/PLATELET
BASO%: 0.5 % (ref 0.0–2.0)
Basophils Absolute: 0 10*3/uL (ref 0.0–0.1)
EOS%: 4.6 % (ref 0.0–7.0)
Eosinophils Absolute: 0.2 10*3/uL (ref 0.0–0.5)
HCT: 36.3 % (ref 34.8–46.6)
HGB: 12.2 g/dL (ref 11.6–15.9)
LYMPH%: 28.7 % (ref 14.0–49.7)
MCH: 31.3 pg (ref 25.1–34.0)
MCHC: 33.6 g/dL (ref 31.5–36.0)
MCV: 93.1 fL (ref 79.5–101.0)
MONO#: 0.4 10*3/uL (ref 0.1–0.9)
MONO%: 9.7 % (ref 0.0–14.0)
NEUT%: 56.5 % (ref 38.4–76.8)
NEUTROS ABS: 2.3 10*3/uL (ref 1.5–6.5)
PLATELETS: 234 10*3/uL (ref 145–400)
RBC: 3.9 10*6/uL (ref 3.70–5.45)
RDW: 14.3 % (ref 11.2–14.5)
WBC: 4.1 10*3/uL (ref 3.9–10.3)
lymph#: 1.2 10*3/uL (ref 0.9–3.3)

## 2017-05-17 MED ORDER — HEPARIN SOD (PORK) LOCK FLUSH 100 UNIT/ML IV SOLN
500.0000 [IU] | Freq: Once | INTRAVENOUS | Status: AC | PRN
  Administered 2017-05-17: 500 [IU]
  Filled 2017-05-17: qty 5

## 2017-05-17 MED ORDER — ACETAMINOPHEN 325 MG PO TABS
650.0000 mg | ORAL_TABLET | Freq: Once | ORAL | Status: AC
  Administered 2017-05-17: 650 mg via ORAL

## 2017-05-17 MED ORDER — DIPHENHYDRAMINE HCL 25 MG PO CAPS
ORAL_CAPSULE | ORAL | Status: AC
  Filled 2017-05-17: qty 1

## 2017-05-17 MED ORDER — SODIUM CHLORIDE 0.9 % IV SOLN
10.0000 mg | Freq: Once | INTRAVENOUS | Status: AC
  Administered 2017-05-17: 10 mg via INTRAVENOUS
  Filled 2017-05-17: qty 1

## 2017-05-17 MED ORDER — FAMOTIDINE IN NACL 20-0.9 MG/50ML-% IV SOLN
20.0000 mg | Freq: Once | INTRAVENOUS | Status: AC
  Administered 2017-05-17: 20 mg via INTRAVENOUS

## 2017-05-17 MED ORDER — TRASTUZUMAB CHEMO 150 MG IV SOLR
150.0000 mg | Freq: Once | INTRAVENOUS | Status: AC
  Administered 2017-05-17: 150 mg via INTRAVENOUS
  Filled 2017-05-17: qty 7.14

## 2017-05-17 MED ORDER — DIPHENHYDRAMINE HCL 50 MG/ML IJ SOLN
25.0000 mg | Freq: Once | INTRAMUSCULAR | Status: AC
  Administered 2017-05-17: 25 mg via INTRAVENOUS

## 2017-05-17 MED ORDER — SODIUM CHLORIDE 0.9% FLUSH
10.0000 mL | INTRAVENOUS | Status: DC | PRN
  Administered 2017-05-17: 10 mL
  Filled 2017-05-17: qty 10

## 2017-05-17 MED ORDER — FAMOTIDINE IN NACL 20-0.9 MG/50ML-% IV SOLN
INTRAVENOUS | Status: AC
  Filled 2017-05-17: qty 50

## 2017-05-17 MED ORDER — SODIUM CHLORIDE 0.9% FLUSH
10.0000 mL | Freq: Once | INTRAVENOUS | Status: AC
  Administered 2017-05-17: 10 mL
  Filled 2017-05-17: qty 10

## 2017-05-17 MED ORDER — SODIUM CHLORIDE 0.9 % IV SOLN
Freq: Once | INTRAVENOUS | Status: AC
  Administered 2017-05-17: 10:00:00 via INTRAVENOUS

## 2017-05-17 MED ORDER — ACETAMINOPHEN 325 MG PO TABS
ORAL_TABLET | ORAL | Status: AC
  Filled 2017-05-17: qty 2

## 2017-05-17 MED ORDER — DIPHENHYDRAMINE HCL 50 MG/ML IJ SOLN
INTRAMUSCULAR | Status: AC
  Filled 2017-05-17: qty 1

## 2017-05-17 MED ORDER — PACLITAXEL CHEMO INJECTION 300 MG/50ML
65.0000 mg/m2 | Freq: Once | INTRAVENOUS | Status: AC
  Administered 2017-05-17: 120 mg via INTRAVENOUS
  Filled 2017-05-17: qty 20

## 2017-05-17 NOTE — Assessment & Plan Note (Signed)
Left breast 2:30 position: 2.1 cm irregular solid mass, additional 0.8 cm oval mass in the left breast (fibrocystic); primary mass on biopsy revealed IDC grade 3, ER 100%, PR 80%, Ki-67 25%, HER-2 positive ratio 1.88, copy #6.2, T2 N0 stage II a clinical stage  Treatment plan: 1. Neoadjuvant Taxol Herceptin weekly 12 followed by Herceptin maintenance for 6 months 2. breast conserving surgery with sentinel lymph node biopsy 3. Adjuvant radiation therapy 4. Followed by adjuvant antiestrogen therapy ----------------------------------------------------------------------------------------------------------------------------------------- Current treatment: Taxol Herceptin today is cycle 7 (being held for cystitis) Chemotherapy toxicities: 1 Mild nausea 2. decreasedneutrophil count ANC 1.3 decreasedthe dosage of Taxol. Today's ANC is 1.8. Continue with the same dosage. 3. Mouth sores: Encouraged her to use salt water gargles. If it does not get better we can call in for Magic mouthwash. 4. Elevated AST: AST levels are stable at 59. 5. Severe hematuria: CT of the abdomen 05/05/2017 revealed circumferential urinary bladder wall thickening with associated inflammatory changes compatible with cystitis Cultures revealed Escherichia coli sensitive to Bactrim Patient received Bactrim antibiotic Urology:

## 2017-05-17 NOTE — Progress Notes (Signed)
Patient Care Team: Orpah Melter, MD as PCP - General (Family Medicine) Excell Seltzer, MD as Consulting Physician (General Surgery) Magrinat, Virgie Dad, MD as Consulting Physician (Oncology) Eppie Gibson, MD as Attending Physician (Radiation Oncology) Huel Cote, NP as Nurse Practitioner (Obstetrics and Gynecology)  DIAGNOSIS:  Encounter Diagnosis  Name Primary?  . Malignant neoplasm of lower-outer quadrant of left breast of female, estrogen receptor positive ([place])     SUMMARY OF ONCOLOGIC HISTORY:   Malignant neoplasm of lower-outer quadrant of left breast of female, estrogen receptor positive ([place])   03/01/2017 Initial Diagnosis    Left breast 2:30 position: 2.1 cm irregular solid mass, additional 0.8 cm oval mass in the left breast (fibrocystic); primary mass on biopsy revealed IDC grade 3, ER 100%, PR 80%, Ki-67 25%, HER-2 positive ratio 1.88, copy #6.2, T2 N0 stage II a clinical stage      03/29/2017 -  Neo-Adjuvant Chemotherapy    Neoadjuvant chemotherapy with Taxol Herceptin      04/27/2017 Genetic Testing    Negative genetic testing on the multi-gene panel.  The Multi-Gene Panel offered by [facility] includes sequencing and/or deletion duplication testing of the following 80 genes: ALK, APC, ATM, AXIN2,BAP1,  BARD1, BLM, BMPR1A, BRCA1, BRCA2, BRIP1, CASR, CDC73, CDH1, CDK4, CDKN1B, CDKN1C, CDKN2A (p14ARF), CDKN2A (p16INK4a), CEBPA, CHEK2, CTNNA1, DICER1, DIS3L2, EGFR (c.2369C>T, p.Thr790Met variant only), EPCAM (Deletion/duplication testing only), FH, FLCN, GATA2, GPC3, GREM1 (Promoter region deletion/duplication testing only), HOXB13 (c.251G>A, p.Gly84Glu), HRAS, KIT, MAX, MEN1, MET, MITF (c.952G>A, p.Glu318Lys variant only), MLH1, MSH2, MSH3, MSH6, MUTYH, NBN, NF1, NF2, NTHL1, PALB2, PDGFRA, PHOX2B, PMS2, POLD1, POLE, POT1, PRKAR1A, PTCH1, PTEN, RAD50, RAD51C, RAD51D, RB1, RECQL4, RET, RUNX1, SDHAF2, SDHA (sequence changes only), SDHB, SDHC, SDHD, SMAD4, SMARCA4, SMARCB1,  SMARCE1, STK11, SUFU, TERT, TERT, TMEM127, TP53, TSC1, TSC2, VHL, WRN and WT1.  The report date is April 27, 2017.        CHIEF COMPLIANT: Cycle 7 Taxol Herceptin  INTERVAL HISTORY: Haley Sanchez is a 56 year old with above-mentioned history of left breast cancer who is currently neoadjuvant chemotherapy. Today is cycle 7 of Taxol Herceptin. Last week Taxol was held because she had profound hematuria. CT scan showed interstitial cystitis. She went to see urology. She went to the emergency room complaining of rectal bleeding. She was diagnosed with diverticulitis. The bleeding stopped after she took antibiotics with Cipro and Flagyl. She also had a cystoscopy which did not show any lesions. She does not have any further urinary bleeding.  REVIEW OF SYSTEMS:   Constitutional: Denies fevers, chills or abnormal weight loss Eyes: Denies blurriness of vision Ears, nose, mouth, throat, and face: Denies mucositis or sore throat Respiratory: Denies cough, dyspnea or wheezes Cardiovascular: Denies palpitation, chest discomfort Gastrointestinal:  Denies nausea, heartburn or change in bowel habits Skin: Denies abnormal skin rashes Lymphatics: Denies new lymphadenopathy or easy bruising Neurological:Denies numbness, tingling or new weaknesses Behavioral/Psych: Mood is stable, no new changes  Extremities: No lower extremity edema Breast:  denies any pain or lumps or nodules in either breasts All other systems were reviewed with the patient and are negative.  I have reviewed the past medical history, past surgical history, social history and family history with the patient and they are unchanged from previous note.  ALLERGIES:  is allergic to no known allergies.  MEDICATIONS:  Current Outpatient Prescriptions  Medication Sig Dispense Refill  . albuterol (PROVENTIL HFA;VENTOLIN HFA) 108 (90 Base) MCG/ACT inhaler Inhale 2 puffs into the lungs every 6 (six) hours as needed  for wheezing or shortness of  breath.    Marland Kitchen buPROPion (WELLBUTRIN XL) 300 MG 24 hr tablet Take 300 mg by mouth daily.    . ciprofloxacin (CIPRO) 500 MG tablet Take 1 tablet (500 mg total) by mouth 2 (two) times daily. 14 tablet 0  . HYDROcodone-acetaminophen (NORCO/VICODIN) 5-325 MG tablet Take 1 tablet by mouth every 4 (four) hours as needed. (Patient taking differently: Take 1 tablet by mouth every 4 (four) hours as needed for moderate pain. ) 10 tablet 0  . lidocaine-prilocaine (EMLA) cream Apply to affected area once (Patient taking differently: Apply 1 application topically daily as needed. Apply to affected area once. Port Access) 30 g 3  . LORazepam (ATIVAN) 0.5 MG tablet Take 0.5 mg by mouth every 8 (eight) hours as needed for anxiety.   0  . losartan (COZAAR) 100 MG tablet Take 100 mg by mouth daily.   0  . metroNIDAZOLE (FLAGYL) 500 MG tablet Take 1 tablet (500 mg total) by mouth 2 (two) times daily. 14 tablet 0  . ondansetron (ZOFRAN) 8 MG tablet Take 1 tablet (8 mg total) by mouth 2 (two) times daily as needed (Nausea or vomiting). 30 tablet 1  . PARoxetine (PAXIL) 40 MG tablet Take 40 mg by mouth every evening.     Marland Kitchen Phenazopyridine HCl (AZO DINE MAXIMUM STRENGTH) 97.5 MG TABS Take 2 tablets by mouth every 8 (eight) hours as needed. Pain    . prochlorperazine (COMPAZINE) 10 MG tablet Take 1 tablet (10 mg total) by mouth every 6 (six) hours as needed (Nausea or vomiting). 30 tablet 1   No current facility-administered medications for this visit.     PHYSICAL EXAMINATION: ECOG PERFORMANCE STATUS: 1 - Symptomatic but completely ambulatory  Vitals:   05/17/17 0924  BP: 104/74  Pulse: 75  Resp: 20  Temp: 98.3 F (36.8 C)   Filed Weights   05/17/17 0924  Weight: 176 lb 4.8 oz (80 kg)    GENERAL:alert, no distress and comfortable SKIN: skin color, texture, turgor are normal, no rashes or significant lesions EYES: normal, Conjunctiva are pink and non-injected, sclera clear OROPHARYNX:no exudate, no erythema  and lips, buccal mucosa, and tongue normal  NECK: supple, thyroid normal size, non-tender, without nodularity LYMPH:  no palpable lymphadenopathy in the cervical, axillary or inguinal LUNGS: clear to auscultation and percussion with normal breathing effort HEART: regular rate & rhythm and no murmurs and no lower extremity edema ABDOMEN:abdomen soft, non-tender and normal bowel sounds MUSCULOSKELETAL:no cyanosis of digits and no clubbing  NEURO: alert & oriented x 3 with fluent speech, no focal motor/sensory deficits EXTREMITIES: No lower extremity edema   LABORATORY DATA:  I have reviewed the data as listed   Chemistry      Component Value Date/Time   NA 139 05/10/2017 0741   K 4.2 05/10/2017 0741   CO2 24 05/10/2017 0741   BUN 17.2 05/10/2017 0741   CREATININE 1.1 05/10/2017 0741      Component Value Date/Time   CALCIUM 9.0 05/10/2017 0741   ALKPHOS 112 05/10/2017 0741   AST 18 05/10/2017 0741   ALT 28 05/10/2017 0741   BILITOT 0.24 05/10/2017 0741       Lab Results  Component Value Date   WBC 4.1 05/17/2017   HGB 12.2 05/17/2017   HCT 36.3 05/17/2017   MCV 93.1 05/17/2017   PLT 234 05/17/2017   NEUTROABS 2.3 05/17/2017    ASSESSMENT & PLAN:  Malignant neoplasm of lower-outer quadrant of left  breast of female, estrogen receptor positive ([place]) Left breast 2:30 position: 2.1 cm irregular solid mass, additional 0.8 cm oval mass in the left breast (fibrocystic); primary mass on biopsy revealed IDC grade 3, ER 100%, PR 80%, Ki-67 25%, HER-2 positive ratio 1.88, copy #6.2, T2 N0 stage II a clinical stage  Treatment plan: 1. Neoadjuvant Taxol Herceptin weekly 12 followed by Herceptin maintenance for 6 months 2. breast conserving surgery with sentinel lymph node biopsy 3. Adjuvant radiation therapy 4. Followed by adjuvant antiestrogen  therapy ----------------------------------------------------------------------------------------------------------------------------------------- Current treatment: Taxol Herceptin today is cycle 7  Chemotherapy toxicities: 1 Mild nausea 2. decreasedneutrophil count ANC 1.3 decreasedthe dosage of Taxol. Today's ANC is 1.8. Continue with the same dosage. 3. Mouth sores: Encouraged her to use salt water gargles. If it does not get better we can call in for Magic mouthwash. 4. Elevated AST: AST levels are stable at 59. 5. Severe hematuria: CT of the abdomen 05/05/2017 revealed circumferential urinary bladder wall thickening with associated inflammatory changes compatible with cystitis. Urology performed a cystoscopy and did not find any lesions. Cultures revealed Escherichia coli sensitive to Bactrim Patient received Bactrim antibiotic 6. Diverticular bleed with rectal bleeding: Went to emergency room and was treated with Cipro and Flagyl.  Resume chemotherapy.  I spent 25 minutes talking to the patient of which more than half was spent in counseling and coordination of care.  No orders of the defined types were placed in this encounter.  The patient has a good understanding of the overall plan. she agrees with it. she will call with any problems that may develop before the next visit here.   Rulon Eisenmenger, MD 05/17/17

## 2017-05-17 NOTE — Patient Instructions (Signed)
[place] Discharge Instructions for Patients Receiving Chemotherapy  Today you received the following chemotherapy agents Herceptin, Taxol.   To help prevent nausea and vomiting after your treatment, we encourage you to take your nausea medication as prescribed.    If you develop nausea and vomiting that is not controlled by your nausea medication, call the clinic.   BELOW ARE SYMPTOMS THAT SHOULD BE REPORTED IMMEDIATELY:  *FEVER GREATER THAN 100.5 F  *CHILLS WITH OR WITHOUT FEVER  NAUSEA AND VOMITING THAT IS NOT CONTROLLED WITH YOUR NAUSEA MEDICATION  *UNUSUAL SHORTNESS OF BREATH  *UNUSUAL BRUISING OR BLEEDING  TENDERNESS IN MOUTH AND THROAT WITH OR WITHOUT PRESENCE OF ULCERS  *URINARY PROBLEMS  *BOWEL PROBLEMS  UNUSUAL RASH Items with * indicate a potential emergency and should be followed up as soon as possible.  Feel free to call the clinic you have any questions or concerns. The clinic phone number is [phone].  Please show the [place] at check-in to the Emergency Department and triage nurse.

## 2017-05-24 ENCOUNTER — Ambulatory Visit (HOSPITAL_BASED_OUTPATIENT_CLINIC_OR_DEPARTMENT_OTHER): Payer: BC Managed Care – PPO

## 2017-05-24 ENCOUNTER — Ambulatory Visit: Payer: BC Managed Care – PPO

## 2017-05-24 ENCOUNTER — Encounter: Payer: BC Managed Care – PPO | Admitting: Women's Health

## 2017-05-24 ENCOUNTER — Other Ambulatory Visit (HOSPITAL_BASED_OUTPATIENT_CLINIC_OR_DEPARTMENT_OTHER): Payer: BC Managed Care – PPO

## 2017-05-24 VITALS — BP 147/82 | HR 80 | Temp 98.3°F | Resp 18

## 2017-05-24 DIAGNOSIS — C50512 Malignant neoplasm of lower-outer quadrant of left female breast: Secondary | ICD-10-CM | POA: Diagnosis not present

## 2017-05-24 DIAGNOSIS — Z170 Estrogen receptor positive status [ER+]: Principal | ICD-10-CM

## 2017-05-24 DIAGNOSIS — Z5112 Encounter for antineoplastic immunotherapy: Secondary | ICD-10-CM

## 2017-05-24 DIAGNOSIS — Z95828 Presence of other vascular implants and grafts: Secondary | ICD-10-CM

## 2017-05-24 LAB — CBC WITH DIFFERENTIAL/PLATELET
BASO%: 0.8 % (ref 0.0–2.0)
Basophils Absolute: 0 10*3/uL (ref 0.0–0.1)
EOS%: 4.9 % (ref 0.0–7.0)
Eosinophils Absolute: 0.2 10*3/uL (ref 0.0–0.5)
HEMATOCRIT: 36.2 % (ref 34.8–46.6)
HEMOGLOBIN: 12.5 g/dL (ref 11.6–15.9)
LYMPH#: 1.2 10*3/uL (ref 0.9–3.3)
LYMPH%: 29.9 % (ref 14.0–49.7)
MCH: 31.6 pg (ref 25.1–34.0)
MCHC: 34.5 g/dL (ref 31.5–36.0)
MCV: 91.6 fL (ref 79.5–101.0)
MONO#: 0.3 10*3/uL (ref 0.1–0.9)
MONO%: 6.9 % (ref 0.0–14.0)
NEUT%: 57.5 % (ref 38.4–76.8)
NEUTROS ABS: 2.3 10*3/uL (ref 1.5–6.5)
PLATELETS: 225 10*3/uL (ref 145–400)
RBC: 3.95 10*6/uL (ref 3.70–5.45)
RDW: 14.6 % — AB (ref 11.2–14.5)
WBC: 3.9 10*3/uL (ref 3.9–10.3)

## 2017-05-24 LAB — COMPREHENSIVE METABOLIC PANEL
ALT: 46 U/L (ref 0–55)
ANION GAP: 8 meq/L (ref 3–11)
AST: 33 U/L (ref 5–34)
Albumin: 3.5 g/dL (ref 3.5–5.0)
Alkaline Phosphatase: 102 U/L (ref 40–150)
BILIRUBIN TOTAL: 0.32 mg/dL (ref 0.20–1.20)
BUN: 18.4 mg/dL (ref 7.0–26.0)
CALCIUM: 9.1 mg/dL (ref 8.4–10.4)
CO2: 27 mEq/L (ref 22–29)
CREATININE: 0.9 mg/dL (ref 0.6–1.1)
Chloride: 106 mEq/L (ref 98–109)
EGFR: 71 mL/min/{1.73_m2} — ABNORMAL LOW (ref >90)
Glucose: 75 mg/dl (ref 70–140)
Potassium: 4.3 mEq/L (ref 3.5–5.1)
Sodium: 141 mEq/L (ref 136–145)
TOTAL PROTEIN: 6.3 g/dL — AB (ref 6.4–8.3)

## 2017-05-24 MED ORDER — SODIUM CHLORIDE 0.9 % IV SOLN
Freq: Once | INTRAVENOUS | Status: AC
  Administered 2017-05-24: 11:00:00 via INTRAVENOUS

## 2017-05-24 MED ORDER — HEPARIN SOD (PORK) LOCK FLUSH 100 UNIT/ML IV SOLN
500.0000 [IU] | Freq: Once | INTRAVENOUS | Status: AC | PRN
  Administered 2017-05-24: 500 [IU]
  Filled 2017-05-24: qty 5

## 2017-05-24 MED ORDER — SODIUM CHLORIDE 0.9 % IV SOLN: 10.0000 mg | Freq: Once | INTRAVENOUS | Status: DC

## 2017-05-24 MED ORDER — DIPHENHYDRAMINE HCL 50 MG/ML IJ SOLN
INTRAMUSCULAR | Status: AC
  Filled 2017-05-24: qty 1

## 2017-05-24 MED ORDER — ACETAMINOPHEN 325 MG PO TABS
ORAL_TABLET | ORAL | Status: AC
  Filled 2017-05-24: qty 2

## 2017-05-24 MED ORDER — TRASTUZUMAB CHEMO 150 MG IV SOLR
150.0000 mg | Freq: Once | INTRAVENOUS | Status: AC
  Administered 2017-05-24: 150 mg via INTRAVENOUS
  Filled 2017-05-24: qty 7.14

## 2017-05-24 MED ORDER — SODIUM CHLORIDE 0.9% FLUSH
10.0000 mL | INTRAVENOUS | Status: DC | PRN
  Administered 2017-05-24: 10 mL
  Filled 2017-05-24: qty 10

## 2017-05-24 MED ORDER — SODIUM CHLORIDE 0.9% FLUSH
10.0000 mL | Freq: Once | INTRAVENOUS | Status: AC
  Administered 2017-05-24: 10 mL
  Filled 2017-05-24: qty 10

## 2017-05-24 MED ORDER — DIPHENHYDRAMINE HCL 50 MG/ML IJ SOLN
25.0000 mg | Freq: Once | INTRAMUSCULAR | Status: AC
  Administered 2017-05-24: 25 mg via INTRAVENOUS

## 2017-05-24 MED ORDER — FAMOTIDINE IN NACL 20-0.9 MG/50ML-% IV SOLN: 20.0000 mg | Freq: Once | INTRAVENOUS | Status: DC

## 2017-05-24 MED ORDER — ACETAMINOPHEN 325 MG PO TABS
650.0000 mg | ORAL_TABLET | Freq: Once | ORAL | Status: AC
  Administered 2017-05-24: 650 mg via ORAL

## 2017-05-24 NOTE — Patient Instructions (Addendum)
[facility] Discharge Instructions for Patients Receiving Chemotherapy  Today you received the following chemotherapy agents: Herceptin   To help prevent nausea and vomiting after your treatment, we encourage you to take your nausea medication as directed.    If you develop nausea and vomiting that is not controlled by your nausea medication, call the clinic.   BELOW ARE SYMPTOMS THAT SHOULD BE REPORTED IMMEDIATELY:  *FEVER GREATER THAN 100.5 F  *CHILLS WITH OR WITHOUT FEVER  NAUSEA AND VOMITING THAT IS NOT CONTROLLED WITH YOUR NAUSEA MEDICATION  *UNUSUAL SHORTNESS OF BREATH  *UNUSUAL BRUISING OR BLEEDING  TENDERNESS IN MOUTH AND THROAT WITH OR WITHOUT PRESENCE OF ULCERS  *URINARY PROBLEMS  *BOWEL PROBLEMS  UNUSUAL RASH Items with * indicate a potential emergency and should be followed up as soon as possible.  Feel free to call the clinic you have any questions or concerns. The clinic phone number is [phone].  Please show the CHEMO ALERT CARD at check-in to the Emergency Department and triage nurse.   

## 2017-05-24 NOTE — Progress Notes (Signed)
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  Pt states the numbness and tingling have been constant for the last 3 days but has not interfered with her ADLs.  Pt states she is still able to button, zip, and turn pages with no issues.  Dr. Jana Hakim notified (on call for Dr. Lindi Adie) and verbal orders received to hold the pt's dose of Taxol for treatment today and when Dr. Lindi Adie returns to the office, he will make changes to the patients treatment plan.  Pharmacy and patient notified.

## 2017-05-31 ENCOUNTER — Encounter: Payer: Self-pay | Admitting: Hematology and Oncology

## 2017-05-31 ENCOUNTER — Other Ambulatory Visit (HOSPITAL_BASED_OUTPATIENT_CLINIC_OR_DEPARTMENT_OTHER): Payer: BC Managed Care – PPO

## 2017-05-31 ENCOUNTER — Ambulatory Visit (HOSPITAL_BASED_OUTPATIENT_CLINIC_OR_DEPARTMENT_OTHER): Payer: BC Managed Care – PPO

## 2017-05-31 ENCOUNTER — Ambulatory Visit (HOSPITAL_BASED_OUTPATIENT_CLINIC_OR_DEPARTMENT_OTHER): Payer: BC Managed Care – PPO | Admitting: Hematology and Oncology

## 2017-05-31 ENCOUNTER — Encounter: Payer: Self-pay | Admitting: *Deleted

## 2017-05-31 DIAGNOSIS — C50512 Malignant neoplasm of lower-outer quadrant of left female breast: Secondary | ICD-10-CM | POA: Diagnosis not present

## 2017-05-31 DIAGNOSIS — G620 Drug-induced polyneuropathy: Secondary | ICD-10-CM | POA: Diagnosis not present

## 2017-05-31 DIAGNOSIS — Z170 Estrogen receptor positive status [ER+]: Secondary | ICD-10-CM | POA: Diagnosis not present

## 2017-05-31 DIAGNOSIS — Z5112 Encounter for antineoplastic immunotherapy: Secondary | ICD-10-CM | POA: Diagnosis not present

## 2017-05-31 DIAGNOSIS — Z5111 Encounter for antineoplastic chemotherapy: Secondary | ICD-10-CM

## 2017-05-31 LAB — CBC WITH DIFFERENTIAL/PLATELET
BASO%: 1 % (ref 0.0–2.0)
Basophils Absolute: 0 10*3/uL (ref 0.0–0.1)
EOS ABS: 0.2 10*3/uL (ref 0.0–0.5)
EOS%: 5.9 % (ref 0.0–7.0)
HCT: 37.3 % (ref 34.8–46.6)
HGB: 12.9 g/dL (ref 11.6–15.9)
LYMPH%: 32 % (ref 14.0–49.7)
MCH: 31.6 pg (ref 25.1–34.0)
MCHC: 34.5 g/dL (ref 31.5–36.0)
MCV: 91.5 fL (ref 79.5–101.0)
MONO#: 0.3 10*3/uL (ref 0.1–0.9)
MONO%: 8.9 % (ref 0.0–14.0)
NEUT#: 1.9 10*3/uL (ref 1.5–6.5)
NEUT%: 52.2 % (ref 38.4–76.8)
PLATELETS: 222 10*3/uL (ref 145–400)
RBC: 4.08 10*6/uL (ref 3.70–5.45)
RDW: 14.6 % — ABNORMAL HIGH (ref 11.2–14.5)
WBC: 3.6 10*3/uL — ABNORMAL LOW (ref 3.9–10.3)
lymph#: 1.2 10*3/uL (ref 0.9–3.3)

## 2017-05-31 LAB — COMPREHENSIVE METABOLIC PANEL WITH GFR
ALT: 41 U/L (ref 0–55)
AST: 20 U/L (ref 5–34)
Albumin: 3.6 g/dL (ref 3.5–5.0)
Alkaline Phosphatase: 100 U/L (ref 40–150)
Anion Gap: 9 meq/L (ref 3–11)
BUN: 19.3 mg/dL (ref 7.0–26.0)
CO2: 27 meq/L (ref 22–29)
Calcium: 9.3 mg/dL (ref 8.4–10.4)
Chloride: 105 meq/L (ref 98–109)
Creatinine: 1 mg/dL (ref 0.6–1.1)
EGFR: 62 ml/min/1.73 m2 — ABNORMAL LOW
Glucose: 80 mg/dL (ref 70–140)
Potassium: 3.7 meq/L (ref 3.5–5.1)
Sodium: 141 meq/L (ref 136–145)
Total Bilirubin: 0.38 mg/dL (ref 0.20–1.20)
Total Protein: 6.6 g/dL (ref 6.4–8.3)

## 2017-05-31 MED ORDER — PACLITAXEL CHEMO INJECTION 300 MG/50ML
65.0000 mg/m2 | Freq: Once | INTRAVENOUS | Status: AC
  Administered 2017-05-31: 120 mg via INTRAVENOUS
  Filled 2017-05-31: qty 20

## 2017-05-31 MED ORDER — DIPHENHYDRAMINE HCL 50 MG/ML IJ SOLN
INTRAMUSCULAR | Status: AC
  Filled 2017-05-31: qty 1

## 2017-05-31 MED ORDER — TRASTUZUMAB CHEMO 150 MG IV SOLR
150.0000 mg | Freq: Once | INTRAVENOUS | Status: AC
  Administered 2017-05-31: 150 mg via INTRAVENOUS
  Filled 2017-05-31: qty 7.14

## 2017-05-31 MED ORDER — ACETAMINOPHEN 325 MG PO TABS
ORAL_TABLET | ORAL | Status: AC
  Filled 2017-05-31: qty 2

## 2017-05-31 MED ORDER — HEPARIN SOD (PORK) LOCK FLUSH 100 UNIT/ML IV SOLN
500.0000 [IU] | Freq: Once | INTRAVENOUS | Status: AC | PRN
  Administered 2017-05-31: 500 [IU]
  Filled 2017-05-31: qty 5

## 2017-05-31 MED ORDER — FAMOTIDINE IN NACL 20-0.9 MG/50ML-% IV SOLN
20.0000 mg | Freq: Once | INTRAVENOUS | Status: AC
  Administered 2017-05-31: 20 mg via INTRAVENOUS

## 2017-05-31 MED ORDER — SODIUM CHLORIDE 0.9 % IV SOLN
Freq: Once | INTRAVENOUS | Status: AC
  Administered 2017-05-31: 10:00:00 via INTRAVENOUS

## 2017-05-31 MED ORDER — FAMOTIDINE IN NACL 20-0.9 MG/50ML-% IV SOLN
INTRAVENOUS | Status: AC
  Filled 2017-05-31: qty 50

## 2017-05-31 MED ORDER — SODIUM CHLORIDE 0.9 % IV SOLN
10.0000 mg | Freq: Once | INTRAVENOUS | Status: AC
  Administered 2017-05-31: 10 mg via INTRAVENOUS
  Filled 2017-05-31: qty 1

## 2017-05-31 MED ORDER — DEXAMETHASONE SODIUM PHOSPHATE 10 MG/ML IJ SOLN
INTRAMUSCULAR | Status: AC
  Filled 2017-05-31: qty 1

## 2017-05-31 MED ORDER — DIPHENHYDRAMINE HCL 50 MG/ML IJ SOLN
25.0000 mg | Freq: Once | INTRAMUSCULAR | Status: AC
  Administered 2017-05-31: 25 mg via INTRAVENOUS

## 2017-05-31 MED ORDER — SODIUM CHLORIDE 0.9% FLUSH
10.0000 mL | INTRAVENOUS | Status: DC | PRN
  Administered 2017-05-31: 10 mL
  Filled 2017-05-31: qty 10

## 2017-05-31 MED ORDER — ACETAMINOPHEN 325 MG PO TABS
650.0000 mg | ORAL_TABLET | Freq: Once | ORAL | Status: AC
  Administered 2017-05-31: 650 mg via ORAL

## 2017-05-31 NOTE — Progress Notes (Signed)
Patient Care Team: Orpah Melter, MD as PCP - General (Family Medicine) Excell Seltzer, MD as Consulting Physician (General Surgery) Magrinat, Virgie Dad, MD as Consulting Physician (Oncology) Eppie Gibson, MD as Attending Physician (Radiation Oncology) Huel Cote, NP as Nurse Practitioner (Obstetrics and Gynecology)  DIAGNOSIS:  Encounter Diagnosis  Name Primary?  . Malignant neoplasm of lower-outer quadrant of left breast of female, estrogen receptor positive ([place])     SUMMARY OF ONCOLOGIC HISTORY:   Malignant neoplasm of lower-outer quadrant of left breast of female, estrogen receptor positive ([place])   03/01/2017 Initial Diagnosis    Left breast 2:30 position: 2.1 cm irregular solid mass, additional 0.8 cm oval mass in the left breast (fibrocystic); primary mass on biopsy revealed IDC grade 3, ER 100%, PR 80%, Ki-67 25%, HER-2 positive ratio 1.88, copy #6.2, T2 N0 stage II a clinical stage      03/29/2017 -  Neo-Adjuvant Chemotherapy    Neoadjuvant chemotherapy with Taxol Herceptin      04/27/2017 Genetic Testing    Negative genetic testing on the multi-gene panel.  The Multi-Gene Panel offered by [facility] includes sequencing and/or deletion duplication testing of the following 80 genes: ALK, APC, ATM, AXIN2,BAP1,  BARD1, BLM, BMPR1A, BRCA1, BRCA2, BRIP1, CASR, CDC73, CDH1, CDK4, CDKN1B, CDKN1C, CDKN2A (p14ARF), CDKN2A (p16INK4a), CEBPA, CHEK2, CTNNA1, DICER1, DIS3L2, EGFR (c.2369C>T, p.Thr790Met variant only), EPCAM (Deletion/duplication testing only), FH, FLCN, GATA2, GPC3, GREM1 (Promoter region deletion/duplication testing only), HOXB13 (c.251G>A, p.Gly84Glu), HRAS, KIT, MAX, MEN1, MET, MITF (c.952G>A, p.Glu318Lys variant only), MLH1, MSH2, MSH3, MSH6, MUTYH, NBN, NF1, NF2, NTHL1, PALB2, PDGFRA, PHOX2B, PMS2, POLD1, POLE, POT1, PRKAR1A, PTCH1, PTEN, RAD50, RAD51C, RAD51D, RB1, RECQL4, RET, RUNX1, SDHAF2, SDHA (sequence changes only), SDHB, SDHC, SDHD, SMAD4, SMARCA4, SMARCB1,  SMARCE1, STK11, SUFU, TERT, TERT, TMEM127, TP53, TSC1, TSC2, VHL, WRN and WT1.  The report date is April 27, 2017.        CHIEF COMPLIANT:  Taxol cycle 8  INTERVAL HISTORY: Haley Sanchez is a  56 year old with above-mentioned history of left breast cancer currently on neoadjuvant Taxol and Herceptin. Today is cycle 8 of treatment. Last week she had tingling and numbness of her fingers and Taxol was held. Today she reports to me that she has no neuropathy at all. She complains of dry eyes  REVIEW OF SYSTEMS:   Constitutional: Denies fevers, chills or abnormal weight loss Eyes: Denies blurriness of vision Ears, nose, mouth, throat, and face: Denies mucositis or sore throat Respiratory: Denies cough, dyspnea or wheezes Cardiovascular: Denies palpitation, chest discomfort Gastrointestinal:  Denies nausea, heartburn or change in bowel habits Skin: Denies abnormal skin rashes Lymphatics: Denies new lymphadenopathy or easy bruising Neurological: Neuropathy has resolved Behavioral/Psych: Mood is stable, no new changes  Extremities: No lower extremity edema All other systems were reviewed with the patient and are negative.  I have reviewed the past medical history, past surgical history, social history and family history with the patient and they are unchanged from previous note.  ALLERGIES:  is allergic to no known allergies.  MEDICATIONS:  Current Outpatient Prescriptions  Medication Sig Dispense Refill  . albuterol (PROVENTIL HFA;VENTOLIN HFA) 108 (90 Base) MCG/ACT inhaler Inhale 2 puffs into the lungs every 6 (six) hours as needed for wheezing or shortness of breath.    Marland Kitchen buPROPion (WELLBUTRIN XL) 300 MG 24 hr tablet Take 300 mg by mouth daily.    . ciprofloxacin (CIPRO) 500 MG tablet Take 1 tablet (500 mg total) by mouth 2 (two) times daily. 14 tablet  0  . HYDROcodone-acetaminophen (NORCO/VICODIN) 5-325 MG tablet Take 1 tablet by mouth every 4 (four) hours as needed. (Patient taking  differently: Take 1 tablet by mouth every 4 (four) hours as needed for moderate pain. ) 10 tablet 0  . lidocaine-prilocaine (EMLA) cream Apply to affected area once (Patient taking differently: Apply 1 application topically daily as needed. Apply to affected area once. Port Access) 30 g 3  . LORazepam (ATIVAN) 0.5 MG tablet Take 0.5 mg by mouth every 8 (eight) hours as needed for anxiety.   0  . losartan (COZAAR) 100 MG tablet Take 100 mg by mouth daily.   0  . metroNIDAZOLE (FLAGYL) 500 MG tablet Take 1 tablet (500 mg total) by mouth 2 (two) times daily. 14 tablet 0  . ondansetron (ZOFRAN) 8 MG tablet Take 1 tablet (8 mg total) by mouth 2 (two) times daily as needed (Nausea or vomiting). 30 tablet 1  . PARoxetine (PAXIL) 40 MG tablet Take 40 mg by mouth every evening.     Marland Kitchen Phenazopyridine HCl (AZO DINE MAXIMUM STRENGTH) 97.5 MG TABS Take 2 tablets by mouth every 8 (eight) hours as needed. Pain    . prochlorperazine (COMPAZINE) 10 MG tablet Take 1 tablet (10 mg total) by mouth every 6 (six) hours as needed (Nausea or vomiting). 30 tablet 1   No current facility-administered medications for this visit.     PHYSICAL EXAMINATION: ECOG PERFORMANCE STATUS: 1 - Symptomatic but completely ambulatory  Vitals:   05/31/17 0921  BP: (!) 149/92  Pulse: 80  Resp: 18  Temp: 98 F (36.7 C)  SpO2: 100%   Filed Weights   05/31/17 0921  Weight: 176 lb (79.8 kg)    GENERAL:alert, no distress and comfortable SKIN: skin color, texture, turgor are normal, no rashes or significant lesions EYES: normal, Conjunctiva are pink and non-injected, sclera clear OROPHARYNX:no exudate, no erythema and lips, buccal mucosa, and tongue normal  NECK: supple, thyroid normal size, non-tender, without nodularity LYMPH:  no palpable lymphadenopathy in the cervical, axillary or inguinal LUNGS: clear to auscultation and percussion with normal breathing effort HEART: regular rate & rhythm and no murmurs and no lower  extremity edema ABDOMEN:abdomen soft, non-tender and normal bowel sounds MUSCULOSKELETAL:no cyanosis of digits and no clubbing  NEURO: alert & oriented x 3 with fluent speech, no focal motor/sensory deficits EXTREMITIES: No lower extremity edema  LABORATORY DATA:  I have reviewed the data as listed   Chemistry      Component Value Date/Time   NA 141 05/31/2017 0850   K 3.7 05/31/2017 0850   CO2 27 05/31/2017 0850   BUN 19.3 05/31/2017 0850   CREATININE 1.0 05/31/2017 0850      Component Value Date/Time   CALCIUM 9.3 05/31/2017 0850   ALKPHOS 100 05/31/2017 0850   AST 20 05/31/2017 0850   ALT 41 05/31/2017 0850   BILITOT 0.38 05/31/2017 0850       Lab Results  Component Value Date   WBC 3.6 (L) 05/31/2017   HGB 12.9 05/31/2017   HCT 37.3 05/31/2017   MCV 91.5 05/31/2017   PLT 222 05/31/2017   NEUTROABS 1.9 05/31/2017    ASSESSMENT & PLAN:  Malignant neoplasm of lower-outer quadrant of left breast of female, estrogen receptor positive ([facility]) Left breast 2:30 position: 2.1 cm irregular solid mass, additional 0.8 cm oval mass in the left breast (fibrocystic); primary mass on biopsy revealed IDC grade 3, ER 100%, PR 80%, Ki-67 25%, HER-2 positive ratio 1.88, copy #  6.2, T2 N0 stage II a clinical stage  Treatment plan: 1. Neoadjuvant Taxol Herceptin weekly 12 followed by Herceptin maintenance for 6 months 2. breast conserving surgery with sentinel lymph node biopsy 3. Adjuvant radiation therapy 4. Followed by adjuvant antiestrogen therapy ----------------------------------------------------------------------------------------------------------------------------------------- Current treatment: Taxol Herceptin today iscycle 8 (Taxol being held for neuropathy) Chemotherapy toxicities: 1 Mild nausea 2. decreasedneutrophil count: Today's ANC is 1.9 3. Mouth sores: Improved. 4. Elevated AST: AST levels are stable  5. Severe hematuria: CT of the abdomen 05/05/2017 revealed  circumferential urinary bladder wall thickening with associated inflammatory changes compatible with cystitis. Urology performed a cystoscopy and did not find any lesions. Treated withemergency room and was treated with Cipro and Flagyl. 7.  Neuropathy: Transient , will resume Taxol  I spent 25 minutes talking to the patient of which more than half was spent in counseling and coordination of care.  No orders of the defined types were placed in this encounter.  The patient has a good understanding of the overall plan. she agrees with it. she will call with any problems that may develop before the next visit here.   Rulon Eisenmenger, MD 05/31/17

## 2017-05-31 NOTE — Patient Instructions (Signed)
[place] Discharge Instructions for Patients Receiving Chemotherapy  Today you received the following chemotherapy agents Herceptin, Taxol.   To help prevent nausea and vomiting after your treatment, we encourage you to take your nausea medication as prescribed.    If you develop nausea and vomiting that is not controlled by your nausea medication, call the clinic.   BELOW ARE SYMPTOMS THAT SHOULD BE REPORTED IMMEDIATELY:  *FEVER GREATER THAN 100.5 F  *CHILLS WITH OR WITHOUT FEVER  NAUSEA AND VOMITING THAT IS NOT CONTROLLED WITH YOUR NAUSEA MEDICATION  *UNUSUAL SHORTNESS OF BREATH  *UNUSUAL BRUISING OR BLEEDING  TENDERNESS IN MOUTH AND THROAT WITH OR WITHOUT PRESENCE OF ULCERS  *URINARY PROBLEMS  *BOWEL PROBLEMS  UNUSUAL RASH Items with * indicate a potential emergency and should be followed up as soon as possible.  Feel free to call the clinic you have any questions or concerns. The clinic phone number is [phone].  Please show the [place] at check-in to the Emergency Department and triage nurse.

## 2017-05-31 NOTE — Assessment & Plan Note (Signed)
Left breast 2:30 position: 2.1 cm irregular solid mass, additional 0.8 cm oval mass in the left breast (fibrocystic); primary mass on biopsy revealed IDC grade 3, ER 100%, PR 80%, Ki-67 25%, HER-2 positive ratio 1.88, copy #6.2, T2 N0 stage II a clinical stage  Treatment plan: 1. Neoadjuvant Taxol Herceptin weekly 12 followed by Herceptin maintenance for 6 months 2. breast conserving surgery with sentinel lymph node biopsy 3. Adjuvant radiation therapy 4. Followed by adjuvant antiestrogen therapy ----------------------------------------------------------------------------------------------------------------------------------------- Current treatment: Taxol Herceptin today iscycle 8  Chemotherapy toxicities: 1 Mild nausea 2. decreasedneutrophil count ANC 1.3 decreasedthe dosage of Taxol. Today's ANC is 1.8. Continue with the same dosage. 3. Mouth sores: Encouraged her to use salt water gargles. If it does not get better we can call in for Magic mouthwash. 4. Elevated AST: AST levels are stable at 59. 5. Severe hematuria: CT of the abdomen 05/05/2017 revealed circumferential urinary bladder wall thickening with associated inflammatory changes compatible with cystitis. Urology performed a cystoscopy and did not find any lesions. Cultures revealed Escherichia coli sensitive to Bactrim Patient received Bactrim antibiotic 6. Diverticular bleed with rectal bleeding: Went to emergency room and was treated with Cipro and Flagyl.  Resume chemotherapy.

## 2017-06-07 ENCOUNTER — Ambulatory Visit: Payer: BC Managed Care – PPO

## 2017-06-07 ENCOUNTER — Other Ambulatory Visit (HOSPITAL_BASED_OUTPATIENT_CLINIC_OR_DEPARTMENT_OTHER): Payer: BC Managed Care – PPO

## 2017-06-07 ENCOUNTER — Ambulatory Visit (HOSPITAL_BASED_OUTPATIENT_CLINIC_OR_DEPARTMENT_OTHER): Payer: BC Managed Care – PPO

## 2017-06-07 VITALS — BP 151/90 | HR 81 | Temp 97.1°F | Resp 18

## 2017-06-07 DIAGNOSIS — Z5111 Encounter for antineoplastic chemotherapy: Secondary | ICD-10-CM | POA: Diagnosis not present

## 2017-06-07 DIAGNOSIS — Z170 Estrogen receptor positive status [ER+]: Principal | ICD-10-CM

## 2017-06-07 DIAGNOSIS — C50512 Malignant neoplasm of lower-outer quadrant of left female breast: Secondary | ICD-10-CM

## 2017-06-07 DIAGNOSIS — Z5112 Encounter for antineoplastic immunotherapy: Secondary | ICD-10-CM | POA: Diagnosis not present

## 2017-06-07 LAB — COMPREHENSIVE METABOLIC PANEL
ALBUMIN: 3.5 g/dL (ref 3.5–5.0)
ALK PHOS: 98 U/L (ref 40–150)
ALT: 42 U/L (ref 0–55)
ANION GAP: 7 meq/L (ref 3–11)
AST: 25 U/L (ref 5–34)
BILIRUBIN TOTAL: 0.38 mg/dL (ref 0.20–1.20)
BUN: 12.6 mg/dL (ref 7.0–26.0)
CO2: 29 mEq/L (ref 22–29)
Calcium: 9 mg/dL (ref 8.4–10.4)
Chloride: 106 mEq/L (ref 98–109)
Creatinine: 0.9 mg/dL (ref 0.6–1.1)
EGFR: 72 mL/min/{1.73_m2} — AB (ref >90)
GLUCOSE: 99 mg/dL (ref 70–140)
POTASSIUM: 3.9 meq/L (ref 3.5–5.1)
SODIUM: 142 meq/L (ref 136–145)
TOTAL PROTEIN: 6.5 g/dL (ref 6.4–8.3)

## 2017-06-07 LAB — CBC WITH DIFFERENTIAL/PLATELET
BASO%: 0.9 % (ref 0.0–2.0)
BASOS ABS: 0 10*3/uL (ref 0.0–0.1)
EOS ABS: 0.1 10*3/uL (ref 0.0–0.5)
EOS%: 4.2 % (ref 0.0–7.0)
HCT: 35.6 % (ref 34.8–46.6)
HEMOGLOBIN: 12.3 g/dL (ref 11.6–15.9)
LYMPH%: 30.9 % (ref 14.0–49.7)
MCH: 31.6 pg (ref 25.1–34.0)
MCHC: 34.5 g/dL (ref 31.5–36.0)
MCV: 91.7 fL (ref 79.5–101.0)
MONO#: 0.2 10*3/uL (ref 0.1–0.9)
MONO%: 6.3 % (ref 0.0–14.0)
NEUT%: 57.7 % (ref 38.4–76.8)
NEUTROS ABS: 1.8 10*3/uL (ref 1.5–6.5)
PLATELETS: 211 10*3/uL (ref 145–400)
RBC: 3.88 10*6/uL (ref 3.70–5.45)
RDW: 14 % (ref 11.2–14.5)
WBC: 3.2 10*3/uL — ABNORMAL LOW (ref 3.9–10.3)
lymph#: 1 10*3/uL (ref 0.9–3.3)

## 2017-06-07 MED ORDER — DEXAMETHASONE SODIUM PHOSPHATE 100 MG/10ML IJ SOLN
10.0000 mg | Freq: Once | INTRAMUSCULAR | Status: AC
  Administered 2017-06-07: 10 mg via INTRAVENOUS
  Filled 2017-06-07: qty 1

## 2017-06-07 MED ORDER — DIPHENHYDRAMINE HCL 50 MG/ML IJ SOLN
25.0000 mg | Freq: Once | INTRAMUSCULAR | Status: AC
  Administered 2017-06-07: 25 mg via INTRAVENOUS

## 2017-06-07 MED ORDER — SODIUM CHLORIDE 0.9% FLUSH
10.0000 mL | INTRAVENOUS | Status: DC | PRN
  Administered 2017-06-07: 10 mL
  Filled 2017-06-07: qty 10

## 2017-06-07 MED ORDER — FAMOTIDINE IN NACL 20-0.9 MG/50ML-% IV SOLN
INTRAVENOUS | Status: AC
  Filled 2017-06-07: qty 50

## 2017-06-07 MED ORDER — PACLITAXEL CHEMO INJECTION 300 MG/50ML
65.0000 mg/m2 | Freq: Once | INTRAVENOUS | Status: AC
  Administered 2017-06-07: 120 mg via INTRAVENOUS
  Filled 2017-06-07: qty 20

## 2017-06-07 MED ORDER — SODIUM CHLORIDE 0.9 % IV SOLN
Freq: Once | INTRAVENOUS | Status: AC
  Administered 2017-06-07: 11:00:00 via INTRAVENOUS

## 2017-06-07 MED ORDER — DEXAMETHASONE SODIUM PHOSPHATE 10 MG/ML IJ SOLN
INTRAMUSCULAR | Status: AC
  Filled 2017-06-07: qty 1

## 2017-06-07 MED ORDER — ACETAMINOPHEN 325 MG PO TABS
650.0000 mg | ORAL_TABLET | Freq: Once | ORAL | Status: AC
  Administered 2017-06-07: 650 mg via ORAL

## 2017-06-07 MED ORDER — ACETAMINOPHEN 325 MG PO TABS
ORAL_TABLET | ORAL | Status: AC
  Filled 2017-06-07: qty 2

## 2017-06-07 MED ORDER — TRASTUZUMAB CHEMO 150 MG IV SOLR
150.0000 mg | Freq: Once | INTRAVENOUS | Status: AC
  Administered 2017-06-07: 150 mg via INTRAVENOUS
  Filled 2017-06-07: qty 7.14

## 2017-06-07 MED ORDER — HEPARIN SOD (PORK) LOCK FLUSH 100 UNIT/ML IV SOLN
500.0000 [IU] | Freq: Once | INTRAVENOUS | Status: AC | PRN
  Administered 2017-06-07: 500 [IU]
  Filled 2017-06-07: qty 5

## 2017-06-07 MED ORDER — DIPHENHYDRAMINE HCL 50 MG/ML IJ SOLN
INTRAMUSCULAR | Status: AC
  Filled 2017-06-07: qty 1

## 2017-06-07 MED ORDER — FAMOTIDINE IN NACL 20-0.9 MG/50ML-% IV SOLN
20.0000 mg | Freq: Once | INTRAVENOUS | Status: AC
  Administered 2017-06-07: 20 mg via INTRAVENOUS

## 2017-06-07 NOTE — Patient Instructions (Addendum)
Trastuzumab injection for infusion What is this medicine? TRASTUZUMAB (tras TOO zoo mab) is a monoclonal antibody. It is used to treat breast cancer and stomach cancer. This medicine may be used for other purposes; ask your health care provider or pharmacist if you have questions. COMMON BRAND NAME(S): Herceptin What should I tell my health care provider before I take this medicine? They need to know if you have any of these conditions: -heart disease -heart failure -lung or breathing disease, like asthma -an unusual or allergic reaction to trastuzumab, benzyl alcohol, or other medications, foods, dyes, or preservatives -pregnant or trying to get pregnant -breast-feeding How should I use this medicine? This drug is given as an infusion into a vein. It is administered in a hospital or clinic by a specially trained health care professional. Talk to your pediatrician regarding the use of this medicine in children. This medicine is not approved for use in children. Overdosage: If you think you have taken too much of this medicine contact a poison control center or emergency room at once. NOTE: This medicine is only for you. Do not share this medicine with others. What if I miss a dose? It is important not to miss a dose. Call your doctor or health care professional if you are unable to keep an appointment. What may interact with this medicine? This medicine may interact with the following medications: -certain types of chemotherapy, such as daunorubicin, doxorubicin, epirubicin, and idarubicin This list may not describe all possible interactions. Give your health care provider a list of all the medicines, herbs, non-prescription drugs, or dietary supplements you use. Also tell them if you smoke, drink alcohol, or use illegal drugs. Some items may interact with your medicine. What should I watch for while using this medicine? Visit your doctor for checks on your progress. Report any side effects.  Continue your course of treatment even though you feel ill unless your doctor tells you to stop. Call your doctor or health care professional for advice if you get a fever, chills or sore throat, or other symptoms of a cold or flu. Do not treat yourself. Try to avoid being around people who are sick. You may experience fever, chills and shaking during your first infusion. These effects are usually mild and can be treated with other medicines. Report any side effects during the infusion to your health care professional. Fever and chills usually do not happen with later infusions. Do not become pregnant while taking this medicine or for 7 months after stopping it. Women should inform their doctor if they wish to become pregnant or think they might be pregnant. Women of child-bearing potential will need to have a negative pregnancy test before starting this medicine. There is a potential for serious side effects to an unborn child. Talk to your health care professional or pharmacist for more information. Do not breast-feed an infant while taking this medicine or for 7 months after stopping it. Women must use effective birth control with this medicine. What side effects may I notice from receiving this medicine? Side effects that you should report to your doctor or health care professional as soon as possible: -allergic reactions like skin rash, itching or hives, swelling of the face, lips, or tongue -chest pain or palpitations -cough -dizziness -feeling faint or lightheaded, falls -fever -general ill feeling or flu-like symptoms -signs of worsening heart failure like breathing problems; swelling in your legs and feet -unusually weak or tired Side effects that usually do not require medical   attention (report to your doctor or health care professional if they continue or are bothersome): -bone pain -changes in taste -diarrhea -joint pain -nausea/vomiting -weight loss This list may not describe all  possible side effects. Call your doctor for medical advice about side effects. You may report side effects to FDA at 1-800-FDA-1088. Where should I keep my medicine? This drug is given in a hospital or clinic and will not be stored at home. NOTE: This sheet is a summary. It may not cover all possible information. If you have questions about this medicine, talk to your doctor, pharmacist, or health care provider.  2018 [employer]/Gold Standard (2016-09-21 14:37:52) Paclitaxel injection What is this medicine? PACLITAXEL (PAK li TAX el) is a chemotherapy drug. It targets fast dividing cells, like cancer cells, and causes these cells to die. This medicine is used to treat ovarian cancer, breast cancer, and other cancers. This medicine may be used for other purposes; ask your health care provider or pharmacist if you have questions. COMMON BRAND NAME(S): Onxol, Taxol What should I tell my health care provider before I take this medicine? They need to know if you have any of these conditions: -blood disorders -irregular heartbeat -infection (especially a virus infection such as chickenpox, cold sores, or herpes) -liver disease -previous or ongoing radiation therapy -an unusual or allergic reaction to paclitaxel, alcohol, polyoxyethylated castor oil, other chemotherapy agents, other medicines, foods, dyes, or preservatives -pregnant or trying to get pregnant -breast-feeding How should I use this medicine? This drug is given as an infusion into a vein. It is administered in a hospital or clinic by a specially trained health care professional. Talk to your pediatrician regarding the use of this medicine in children. Special care may be needed. Overdosage: If you think you have taken too much of this medicine contact a poison control center or emergency room at once. NOTE: This medicine is only for you. Do not share this medicine with others. What if I miss a dose? It is important not to miss your  dose. Call your doctor or health care professional if you are unable to keep an appointment. What may interact with this medicine? Do not take this medicine with any of the following medications: -disulfiram -metronidazole This medicine may also interact with the following medications: -cyclosporine -diazepam -ketoconazole -medicines to increase blood counts like filgrastim, pegfilgrastim, sargramostim -other chemotherapy drugs like cisplatin, doxorubicin, epirubicin, etoposide, teniposide, vincristine -quinidine -testosterone -vaccines -verapamil Talk to your doctor or health care professional before taking any of these medicines: -acetaminophen -aspirin -ibuprofen -ketoprofen -naproxen This list may not describe all possible interactions. Give your health care provider a list of all the medicines, herbs, non-prescription drugs, or dietary supplements you use. Also tell them if you smoke, drink alcohol, or use illegal drugs. Some items may interact with your medicine. What should I watch for while using this medicine? Your condition will be monitored carefully while you are receiving this medicine. You will need important blood work done while you are taking this medicine. This medicine can cause serious allergic reactions. To reduce your risk you will need to take other medicine(s) before treatment with this medicine. If you experience allergic reactions like skin rash, itching or hives, swelling of the face, lips, or tongue, tell your doctor or health care professional right away. In some cases, you may be given additional medicines to help with side effects. Follow all directions for their use. This drug may make you feel generally unwell. This is not uncommon,  as chemotherapy can affect healthy cells as well as cancer cells. Report any side effects. Continue your course of treatment even though you feel ill unless your doctor tells you to stop. Call your doctor or health care  professional for advice if you get a fever, chills or sore throat, or other symptoms of a cold or flu. Do not treat yourself. This drug decreases your body's ability to fight infections. Try to avoid being around people who are sick. This medicine may increase your risk to bruise or bleed. Call your doctor or health care professional if you notice any unusual bleeding. Be careful brushing and flossing your teeth or using a toothpick because you may get an infection or bleed more easily. If you have any dental work done, tell your dentist you are receiving this medicine. Avoid taking products that contain aspirin, acetaminophen, ibuprofen, naproxen, or ketoprofen unless instructed by your doctor. These medicines may hide a fever. Do not become pregnant while taking this medicine. Women should inform their doctor if they wish to become pregnant or think they might be pregnant. There is a potential for serious side effects to an unborn child. Talk to your health care professional or pharmacist for more information. Do not breast-feed an infant while taking this medicine. Men are advised not to father a child while receiving this medicine. This product may contain alcohol. Ask your pharmacist or healthcare provider if this medicine contains alcohol. Be sure to tell all healthcare providers you are taking this medicine. Certain medicines, like metronidazole and disulfiram, can cause an unpleasant reaction when taken with alcohol. The reaction includes flushing, headache, nausea, vomiting, sweating, and increased thirst. The reaction can last from 30 minutes to several hours. What side effects may I notice from receiving this medicine? Side effects that you should report to your doctor or health care professional as soon as possible: -allergic reactions like skin rash, itching or hives, swelling of the face, lips, or tongue -low blood counts - This drug may decrease the number of white blood cells, red blood  cells and platelets. You may be at increased risk for infections and bleeding. -signs of infection - fever or chills, cough, sore throat, pain or difficulty passing urine -signs of decreased platelets or bleeding - bruising, pinpoint red spots on the skin, black, tarry stools, nosebleeds -signs of decreased red blood cells - unusually weak or tired, fainting spells, lightheadedness -breathing problems -chest pain -high or low blood pressure -mouth sores -nausea and vomiting -pain, swelling, redness or irritation at the injection site -pain, tingling, numbness in the hands or feet -slow or irregular heartbeat -swelling of the ankle, feet, hands Side effects that usually do not require medical attention (report to your doctor or health care professional if they continue or are bothersome): -bone pain -complete hair loss including hair on your head, underarms, pubic hair, eyebrows, and eyelashes -changes in the color of fingernails -diarrhea -loosening of the fingernails -loss of appetite -muscle or joint pain -red flush to skin -sweating This list may not describe all possible side effects. Call your doctor for medical advice about side effects. You may report side effects to FDA at 1-800-FDA-1088. Where should I keep my medicine? This drug is given in a hospital or clinic and will not be stored at home. NOTE: This sheet is a summary. It may not cover all possible information. If you have questions about this medicine, talk to your doctor, pharmacist, or health care provider.  2018 [employer]/Gold Standard (2015-07-29   19:58:00) Acetaminophen tablets or caplets What is this medicine? ACETAMINOPHEN (a set a MEE noe fen) is a pain reliever. It is used to treat mild pain and fever. This medicine may be used for other purposes; ask your health care provider or pharmacist if you have questions. COMMON BRAND NAME(S): Aceta, Actamin, Anacin Aspirin Free, Genapap, Genebs, Mapap, Pain & Fever,  Pain and Fever, PAIN RELIEF, PAIN RELIEF Extra Strength, Pain Reliever, Panadol, PHARBETOL, Q-Pap, Q-Pap Extra Strength, Tylenol, Tylenol CrushableTablet, Tylenol Extra Strength, XS No Aspirin, XS Pain Reliever What should I tell my health care provider before I take this medicine? They need to know if you have any of these conditions: -if you often drink alcohol -liver disease -an unusual or allergic reaction to acetaminophen, other medicines, foods, dyes, or preservatives -pregnant or trying to get pregnant -breast-feeding How should I use this medicine? Take this medicine by mouth with a glass of water. Follow the directions on the package or prescription label. Take your medicine at regular intervals. Do not take your medicine more often than directed. Talk to your pediatrician regarding the use of this medicine in children. While this drug may be prescribed for children as young as 40 years of age for selected conditions, precautions do apply. Overdosage: If you think you have taken too much of this medicine contact a poison control center or emergency room at once. NOTE: This medicine is only for you. Do not share this medicine with others. What if I miss a dose? If you miss a dose, take it as soon as you can. If it is almost time for your next dose, take only that dose. Do not take double or extra doses. What may interact with this medicine? -alcohol -imatinib -isoniazid -other medicines with acetaminophen This list may not describe all possible interactions. Give your health care provider a list of all the medicines, herbs, non-prescription drugs, or dietary supplements you use. Also tell them if you smoke, drink alcohol, or use illegal drugs. Some items may interact with your medicine. What should I watch for while using this medicine? Tell your doctor or health care professional if the pain lasts more than 10 days (5 days for children), if it gets worse, or if there is a new or  different kind of pain. Also, check with your doctor if a fever lasts for more than 3 days. Do not take other medicines that contain acetaminophen with this medicine. Always read labels carefully. If you have questions, ask your doctor or pharmacist. If you take too much acetaminophen get medical help right away. Too much acetaminophen can be very dangerous and cause liver damage. Even if you do not have symptoms, it is important to get help right away. What side effects may I notice from receiving this medicine? Side effects that you should report to your doctor or health care professional as soon as possible: -allergic reactions like skin rash, itching or hives, swelling of the face, lips, or tongue -breathing problems -fever or sore throat -redness, blistering, peeling or loosening of the skin, including inside the mouth -trouble passing urine or change in the amount of urine -unusual bleeding or bruising -unusually weak or tired -yellowing of the eyes or skin Side effects that usually do not require medical attention (report to your doctor or health care professional if they continue or are bothersome): -headache -nausea, stomach upset This list may not describe all possible side effects. Call your doctor for medical advice about side effects. You may report  side effects to FDA at 1-800-FDA-1088. Where should I keep my medicine? Keep out of reach of children. Store at room temperature between 20 and 25 degrees C (68 and 77 degrees F). Protect from moisture and heat. Throw away any unused medicine after the expiration date. NOTE: This sheet is a summary. It may not cover all possible information. If you have questions about this medicine, talk to your doctor, pharmacist, or health care provider.  2018 [employer]/Gold Standard (2013-05-21 12:54:16) Famotidine injection What is this medicine? FAMOTIDINE (fa MOE ti deen) is a type of antihistamine that blocks the release of stomach acid. It is  used to treat stomach or intestinal ulcers. It can relieve ulcer pain and discomfort, and the heartburn from acid reflux. This medicine may be used for other purposes; ask your health care provider or pharmacist if you have questions. COMMON BRAND NAME(S): Pepcid What should I tell my health care provider before I take this medicine? They need to know if you have any of these conditions: -kidney or liver disease -an unusual or allergic reaction to famotidine, other medicines, foods, dyes, or preservatives -pregnant or trying to get pregnant -breast-feeding How should I use this medicine? This medicine is for infusion into a vein. It is given by a health care professional in a hospital or clinic setting. Talk to your pediatrician regarding the use of this medicine in children. Special care may be needed. Overdosage: If you think you have taken too much of this medicine contact a poison control center or emergency room at once. NOTE: This medicine is only for you. Do not share this medicine with others. What if I miss a dose? This does not apply. What may interact with this medicine? -delavirdine -itraconazole -ketoconazole This list may not describe all possible interactions. Give your health care provider a list of all the medicines, herbs, non-prescription drugs, or dietary supplements you use. Also tell them if you smoke, drink alcohol, or use illegal drugs. Some items may interact with your medicine. What should I watch for while using this medicine? Tell your doctor or health care professional if your condition does not start to get better or gets worse. Do not take with aspirin, ibuprofen, or other antiinflammatory medicines. These can aggravate your condition. Do not smoke cigarettes or drink alcohol. These increase irritation in your stomach and can increase the time it will take for ulcers to heal. Cigarettes and alcohol can also worsen acid reflux or heartburn. If you get black,  tarry stools or vomit up what looks like coffee grounds, call your doctor or health care professional at once. You may have a bleeding ulcer. What side effects may I notice from receiving this medicine? Side effects that you should report to your doctor or health care professional as soon as possible: -allergic reactions like skin rash, itching or hives, swelling of the face, lips, or tongue -agitation, nervousness -confusion -hallucinations Side effects that usually do not require medical attention (report to your doctor or health care professional if they continue or are bothersome): -constipation -diarrhea -dizziness -headache This list may not describe all possible side effects. Call your doctor for medical advice about side effects. You may report side effects to FDA at 1-800-FDA-1088. Where should I keep my medicine? This medicine is given in a hospital or clinic. You will not be given this medicine to store at home. NOTE: This sheet is a summary. It may not cover all possible information. If you have questions about this medicine, talk  to your doctor, pharmacist, or health care provider.  2018 [employer]/Gold Standard (2008-01-31 13:24:51) Diphenhydramine injection What is this medicine? DIPHENHYDRAMINE (dye fen HYE dra meen) is an antihistamine. It is used to treat the symptoms of an allergic reaction and motion sickness. It is also used to treat Parkinson's disease. This medicine may be used for other purposes; ask your health care provider or pharmacist if you have questions. COMMON BRAND NAME(S): Benadryl What should I tell my health care provider before I take this medicine? They need to know if you have any of these conditions: -asthma or lung disease -glaucoma -high blood pressure or heart disease -liver disease -pain or difficulty passing urine -prostate trouble -ulcers or other stomach problems -an unusual or allergic reaction to diphenhydramine, antihistamines, other  medicines foods, dyes, or preservatives -pregnant or trying to get pregnant -breast-feeding How should I use this medicine? This medicine is for injection into a vein or a muscle. It is usually given by a health care professional in a hospital or clinic setting. If you get this medicine at home, you will be taught how to prepare and give this medicine. Use exactly as directed. Take your medicine at regular intervals. Do not take your medicine more often than directed. It is important that you put your used needles and syringes in a special sharps container. Do not put them in a trash can. If you do not have a sharps container, call your pharmacist or healthcare provider to get one. Talk to your pediatrician regarding the use of this medicine in children. While this drug may be prescribed for selected conditions, precautions do apply. This medicine is not approved for use in newborns and premature babies. Patients over 24 years old may have a stronger reaction and need a smaller dose. Overdosage: If you think you have taken too much of this medicine contact a poison control center or emergency room at once. NOTE: This medicine is only for you. Do not share this medicine with others. What if I miss a dose? If you miss a dose, take it as soon as you can. If it is almost time for your next dose, take only that dose. Do not take double or extra doses. What may interact with this medicine? Do not take this medicine with any of the following medications: -MAOIs like Carbex, Eldepryl, Marplan, Nardil, and Parnate This medicine may also interact with the following medications: -alcohol -barbiturates, like phenobarbital -medicines for bladder spasm like oxybutynin, tolterodine -medicines for blood pressure -medicines for depression, anxiety, or psychotic disturbances -medicines for movement abnormalities or Parkinson's disease -medicines for sleep -other medicines for cold, cough or allergy -some  medicines for the stomach like chlordiazepoxide, dicyclomine This list may not describe all possible interactions. Give your health care provider a list of all the medicines, herbs, non-prescription drugs, or dietary supplements you use. Also tell them if you smoke, drink alcohol, or use illegal drugs. Some items may interact with your medicine. What should I watch for while using this medicine? Your condition will be monitored carefully while you are receiving this medicine. Tell your doctor or healthcare professional if your symptoms do not start to get better or if they get worse. You may get drowsy or dizzy. Do not drive, use machinery, or do anything that needs mental alertness until you know how this medicine affects you. Do not stand or sit up quickly, especially if you are an older patient. This reduces the risk of dizzy or fainting spells. Alcohol may  interfere with the effect of this medicine. Avoid alcoholic drinks. Your mouth may get dry. Chewing sugarless gum or sucking hard candy, and drinking plenty of water may help. Contact your doctor if the problem does not go away or is severe. What side effects may I notice from receiving this medicine? Side effects that you should report to your doctor or health care professional as soon as possible: -allergic reactions like skin rash, itching or hives, swelling of the face, lips, or tongue -breathing problems -changes in vision -chills -confused, agitated, nervous -irregular or fast heartbeat -low blood pressure -seizures -tremor -trouble passing urine -unusual bleeding or bruising -unusually weak or tired Side effects that usually do not require medical attention (report to your doctor or health care professional if they continue or are bothersome): -constipation, diarrhea -drowsy -headache -loss of appetite -stomach upset, vomiting -sweating -thick mucous This list may not describe all possible side effects. Call your doctor for  medical advice about side effects. You may report side effects to FDA at 1-800-FDA-1088. Where should I keep my medicine? Keep out of the reach of children. If you are using this medicine at home, you will be instructed on how to store this medicine. Throw away any unused medicine after the expiration date on the label. NOTE: This sheet is a summary. It may not cover all possible information. If you have questions about this medicine, talk to your doctor, pharmacist, or health care provider.  2018 [employer] Standard (2008-01-16 14:28:35)

## 2017-06-14 ENCOUNTER — Other Ambulatory Visit: Payer: Self-pay | Admitting: Hematology and Oncology

## 2017-06-14 ENCOUNTER — Ambulatory Visit: Payer: BC Managed Care – PPO

## 2017-06-14 ENCOUNTER — Ambulatory Visit (HOSPITAL_BASED_OUTPATIENT_CLINIC_OR_DEPARTMENT_OTHER): Payer: BC Managed Care – PPO

## 2017-06-14 ENCOUNTER — Ambulatory Visit (HOSPITAL_BASED_OUTPATIENT_CLINIC_OR_DEPARTMENT_OTHER): Payer: BC Managed Care – PPO | Admitting: Hematology and Oncology

## 2017-06-14 ENCOUNTER — Encounter: Payer: Self-pay | Admitting: *Deleted

## 2017-06-14 ENCOUNTER — Other Ambulatory Visit (HOSPITAL_BASED_OUTPATIENT_CLINIC_OR_DEPARTMENT_OTHER): Payer: BC Managed Care – PPO

## 2017-06-14 DIAGNOSIS — Z5111 Encounter for antineoplastic chemotherapy: Secondary | ICD-10-CM | POA: Diagnosis not present

## 2017-06-14 DIAGNOSIS — G620 Drug-induced polyneuropathy: Secondary | ICD-10-CM | POA: Diagnosis not present

## 2017-06-14 DIAGNOSIS — C50512 Malignant neoplasm of lower-outer quadrant of left female breast: Secondary | ICD-10-CM

## 2017-06-14 DIAGNOSIS — Z170 Estrogen receptor positive status [ER+]: Secondary | ICD-10-CM | POA: Diagnosis not present

## 2017-06-14 DIAGNOSIS — Z5112 Encounter for antineoplastic immunotherapy: Secondary | ICD-10-CM | POA: Diagnosis not present

## 2017-06-14 DIAGNOSIS — Z95828 Presence of other vascular implants and grafts: Secondary | ICD-10-CM

## 2017-06-14 LAB — COMPREHENSIVE METABOLIC PANEL
ALT: 61 U/L — ABNORMAL HIGH (ref 0–55)
ANION GAP: 7 meq/L (ref 3–11)
AST: 36 U/L — AB (ref 5–34)
Albumin: 3.6 g/dL (ref 3.5–5.0)
Alkaline Phosphatase: 97 U/L (ref 40–150)
BILIRUBIN TOTAL: 0.39 mg/dL (ref 0.20–1.20)
BUN: 10.5 mg/dL (ref 7.0–26.0)
CALCIUM: 9 mg/dL (ref 8.4–10.4)
CHLORIDE: 106 meq/L (ref 98–109)
CO2: 29 meq/L (ref 22–29)
CREATININE: 0.9 mg/dL (ref 0.6–1.1)
EGFR: 71 mL/min/{1.73_m2} — ABNORMAL LOW (ref >90)
Glucose: 92 mg/dl (ref 70–140)
Potassium: 4.1 mEq/L (ref 3.5–5.1)
Sodium: 142 mEq/L (ref 136–145)
TOTAL PROTEIN: 6.5 g/dL (ref 6.4–8.3)

## 2017-06-14 LAB — CBC WITH DIFFERENTIAL/PLATELET
BASO%: 0.6 % (ref 0.0–2.0)
Basophils Absolute: 0 10*3/uL (ref 0.0–0.1)
EOS%: 1.8 % (ref 0.0–7.0)
Eosinophils Absolute: 0.1 10*3/uL (ref 0.0–0.5)
HEMATOCRIT: 36.2 % (ref 34.8–46.6)
HGB: 12.2 g/dL (ref 11.6–15.9)
LYMPH#: 0.9 10*3/uL (ref 0.9–3.3)
LYMPH%: 28.7 % (ref 14.0–49.7)
MCH: 31.4 pg (ref 25.1–34.0)
MCHC: 33.7 g/dL (ref 31.5–36.0)
MCV: 93.1 fL (ref 79.5–101.0)
MONO#: 0.3 10*3/uL (ref 0.1–0.9)
MONO%: 7.6 % (ref 0.0–14.0)
NEUT%: 61.3 % (ref 38.4–76.8)
NEUTROS ABS: 2 10*3/uL (ref 1.5–6.5)
PLATELETS: 214 10*3/uL (ref 145–400)
RBC: 3.89 10*6/uL (ref 3.70–5.45)
RDW: 13.7 % (ref 11.2–14.5)
WBC: 3.3 10*3/uL — AB (ref 3.9–10.3)

## 2017-06-14 MED ORDER — DIPHENHYDRAMINE HCL 50 MG/ML IJ SOLN
INTRAMUSCULAR | Status: AC
  Filled 2017-06-14: qty 1

## 2017-06-14 MED ORDER — HEPARIN SOD (PORK) LOCK FLUSH 100 UNIT/ML IV SOLN
500.0000 [IU] | Freq: Once | INTRAVENOUS | Status: AC | PRN
  Administered 2017-06-14: 500 [IU]
  Filled 2017-06-14: qty 5

## 2017-06-14 MED ORDER — FAMOTIDINE IN NACL 20-0.9 MG/50ML-% IV SOLN
20.0000 mg | Freq: Once | INTRAVENOUS | Status: AC
  Administered 2017-06-14: 20 mg via INTRAVENOUS

## 2017-06-14 MED ORDER — SODIUM CHLORIDE 0.9 % IV SOLN
Freq: Once | INTRAVENOUS | Status: AC
  Administered 2017-06-14: 12:00:00 via INTRAVENOUS

## 2017-06-14 MED ORDER — ACETAMINOPHEN 325 MG PO TABS
ORAL_TABLET | ORAL | Status: AC
  Filled 2017-06-14: qty 2

## 2017-06-14 MED ORDER — DIPHENHYDRAMINE HCL 50 MG/ML IJ SOLN
25.0000 mg | Freq: Once | INTRAMUSCULAR | Status: AC
  Administered 2017-06-14: 25 mg via INTRAVENOUS

## 2017-06-14 MED ORDER — SODIUM CHLORIDE 0.9% FLUSH
10.0000 mL | INTRAVENOUS | Status: DC | PRN
Start: 2017-06-14 — End: 2017-06-14
  Administered 2017-06-14: 10 mL
  Filled 2017-06-14: qty 10

## 2017-06-14 MED ORDER — DEXAMETHASONE SODIUM PHOSPHATE 10 MG/ML IJ SOLN
INTRAMUSCULAR | Status: AC
  Filled 2017-06-14: qty 1

## 2017-06-14 MED ORDER — SODIUM CHLORIDE 0.9% FLUSH
10.0000 mL | Freq: Once | INTRAVENOUS | Status: AC
  Administered 2017-06-14: 10 mL
  Filled 2017-06-14: qty 10

## 2017-06-14 MED ORDER — DEXAMETHASONE SODIUM PHOSPHATE 100 MG/10ML IJ SOLN
10.0000 mg | Freq: Once | INTRAMUSCULAR | Status: AC
  Administered 2017-06-14: 10 mg via INTRAVENOUS
  Filled 2017-06-14: qty 1

## 2017-06-14 MED ORDER — TRASTUZUMAB CHEMO 150 MG IV SOLR
150.0000 mg | Freq: Once | INTRAVENOUS | Status: AC
  Administered 2017-06-14: 150 mg via INTRAVENOUS
  Filled 2017-06-14: qty 7.14

## 2017-06-14 MED ORDER — ACETAMINOPHEN 325 MG PO TABS
650.0000 mg | ORAL_TABLET | Freq: Once | ORAL | Status: AC
  Administered 2017-06-14: 650 mg via ORAL

## 2017-06-14 MED ORDER — FAMOTIDINE IN NACL 20-0.9 MG/50ML-% IV SOLN
INTRAVENOUS | Status: AC
  Filled 2017-06-14: qty 50

## 2017-06-14 MED ORDER — PACLITAXEL CHEMO INJECTION 300 MG/50ML
65.0000 mg/m2 | Freq: Once | INTRAVENOUS | Status: AC
  Administered 2017-06-14: 120 mg via INTRAVENOUS
  Filled 2017-06-14: qty 20

## 2017-06-14 NOTE — Patient Instructions (Signed)
[facility] Discharge Instructions for Patients Receiving Chemotherapy  Today you received the following chemotherapy agentsTAXOL To help prevent nausea and vomiting after your treatment, we encourage you to take your nausea medication AS PRESCRIBED- prochlorperazine (COMPAZINE) 10 MG tablet 30 tablet 1 03/14/2017    Sig - Route: Take 1 tablet (10 mg total) by mouth every 6 (six) hours as needed (Nausea or vomiting). -       If you develop nausea and vomiting that is not controlled by your nausea medication, call the clinic.   BELOW ARE SYMPTOMS THAT SHOULD BE REPORTED IMMEDIATELY:  *FEVER GREATER THAN 100.5 F  *CHILLS WITH OR WITHOUT FEVER  NAUSEA AND VOMITING THAT IS NOT CONTROLLED WITH YOUR NAUSEA MEDICATION  *UNUSUAL SHORTNESS OF BREATH  *UNUSUAL BRUISING OR BLEEDING  TENDERNESS IN MOUTH AND THROAT WITH OR WITHOUT PRESENCE OF ULCERS  *URINARY PROBLEMS  *BOWEL PROBLEMS  UNUSUAL RASH Items with * indicate a potential emergency and should be followed up as soon as possible.  Feel free to call the clinic you have any questions or concerns. The clinic phone number is [phone].  Please show the [place] at check-in to the Emergency Department and triage nurse.

## 2017-06-14 NOTE — Progress Notes (Signed)
Patient Care Team: Orpah Melter, MD as PCP - General (Family Medicine) Excell Seltzer, MD as Consulting Physician (General Surgery) Magrinat, Virgie Dad, MD as Consulting Physician (Oncology) Eppie Gibson, MD as Attending Physician (Radiation Oncology) Huel Cote, NP as Nurse Practitioner (Obstetrics and Gynecology)  DIAGNOSIS:  Encounter Diagnosis  Name Primary?  . Malignant neoplasm of lower-outer quadrant of left breast of female, estrogen receptor positive ([place])     SUMMARY OF ONCOLOGIC HISTORY:   Malignant neoplasm of lower-outer quadrant of left breast of female, estrogen receptor positive ([place])   03/01/2017 Initial Diagnosis    Left breast 2:30 position: 2.1 cm irregular solid mass, additional 0.8 cm oval mass in the left breast (fibrocystic); primary mass on biopsy revealed IDC grade 3, ER 100%, PR 80%, Ki-67 25%, HER-2 positive ratio 1.88, copy #6.2, T2 N0 stage II a clinical stage      03/29/2017 -  Neo-Adjuvant Chemotherapy    Neoadjuvant chemotherapy with Taxol Herceptin      04/27/2017 Genetic Testing    Negative genetic testing on the multi-gene panel.  The Multi-Gene Panel offered by [facility] includes sequencing and/or deletion duplication testing of the following 80 genes: ALK, APC, ATM, AXIN2,BAP1,  BARD1, BLM, BMPR1A, BRCA1, BRCA2, BRIP1, CASR, CDC73, CDH1, CDK4, CDKN1B, CDKN1C, CDKN2A (p14ARF), CDKN2A (p16INK4a), CEBPA, CHEK2, CTNNA1, DICER1, DIS3L2, EGFR (c.2369C>T, p.Thr790Met variant only), EPCAM (Deletion/duplication testing only), FH, FLCN, GATA2, GPC3, GREM1 (Promoter region deletion/duplication testing only), HOXB13 (c.251G>A, p.Gly84Glu), HRAS, KIT, MAX, MEN1, MET, MITF (c.952G>A, p.Glu318Lys variant only), MLH1, MSH2, MSH3, MSH6, MUTYH, NBN, NF1, NF2, NTHL1, PALB2, PDGFRA, PHOX2B, PMS2, POLD1, POLE, POT1, PRKAR1A, PTCH1, PTEN, RAD50, RAD51C, RAD51D, RB1, RECQL4, RET, RUNX1, SDHAF2, SDHA (sequence changes only), SDHB, SDHC, SDHD, SMAD4, SMARCA4, SMARCB1,  SMARCE1, STK11, SUFU, TERT, TERT, TMEM127, TP53, TSC1, TSC2, VHL, WRN and WT1.  The report date is April 27, 2017.        CHIEF COMPLIANT: Taxol Herceptin weekly 11  INTERVAL HISTORY: Chinita H Sabas is a 56 year old above-mentioned history of neoadjuvant chemotherapy with Taxol Herceptin. Today is week 11 of treatment. Were very carefully monitored for neuropathy. Denies any nausea vomiting. Today she complains of a burning pain underneath both her feet  REVIEW OF SYSTEMS:   Constitutional: Denies fevers, chills or abnormal weight loss Eyes: Denies blurriness of vision Ears, nose, mouth, throat, and face: Denies mucositis or sore throat Respiratory: Denies cough, dyspnea or wheezes Cardiovascular: Denies palpitation, chest discomfort Gastrointestinal:  Denies nausea, heartburn or change in bowel habits Skin: Denies abnormal skin rashes Lymphatics: Denies new lymphadenopathy or easy bruising Neurological: Burning pain underneath her feet Behavioral/Psych: Mood is stable, no new changes  Extremities: No lower extremity edema Breast:  denies any pain or lumps or nodules in either breasts All other systems were reviewed with the patient and are negative.  I have reviewed the past medical history, past surgical history, social history and family history with the patient and they are unchanged from previous note.  ALLERGIES:  is allergic to no known allergies.  MEDICATIONS:  Current Outpatient Prescriptions  Medication Sig Dispense Refill  . albuterol (PROVENTIL HFA;VENTOLIN HFA) 108 (90 Base) MCG/ACT inhaler Inhale 2 puffs into the lungs every 6 (six) hours as needed for wheezing or shortness of breath.    Marland Kitchen buPROPion (WELLBUTRIN XL) 300 MG 24 hr tablet Take 300 mg by mouth daily.    . ciprofloxacin (CIPRO) 500 MG tablet Take 1 tablet (500 mg total) by mouth 2 (two) times daily. 14 tablet 0  .  HYDROcodone-acetaminophen (NORCO/VICODIN) 5-325 MG tablet Take 1 tablet by mouth every 4 (four)  hours as needed. (Patient taking differently: Take 1 tablet by mouth every 4 (four) hours as needed for moderate pain. ) 10 tablet 0  . lidocaine-prilocaine (EMLA) cream Apply to affected area once (Patient taking differently: Apply 1 application topically daily as needed. Apply to affected area once. Port Access) 30 g 3  . LORazepam (ATIVAN) 0.5 MG tablet Take 0.5 mg by mouth every 8 (eight) hours as needed for anxiety.   0  . losartan (COZAAR) 100 MG tablet Take 100 mg by mouth daily.   0  . metroNIDAZOLE (FLAGYL) 500 MG tablet Take 1 tablet (500 mg total) by mouth 2 (two) times daily. 14 tablet 0  . ondansetron (ZOFRAN) 8 MG tablet Take 1 tablet (8 mg total) by mouth 2 (two) times daily as needed (Nausea or vomiting). 30 tablet 1  . PARoxetine (PAXIL) 40 MG tablet Take 40 mg by mouth every evening.     Marland Kitchen Phenazopyridine HCl (AZO DINE MAXIMUM STRENGTH) 97.5 MG TABS Take 2 tablets by mouth every 8 (eight) hours as needed. Pain    . prochlorperazine (COMPAZINE) 10 MG tablet Take 1 tablet (10 mg total) by mouth every 6 (six) hours as needed (Nausea or vomiting). 30 tablet 1   No current facility-administered medications for this visit.    Facility-Administered Medications Ordered in Other Visits  Medication Dose Route Frequency Provider Last Rate Last Dose  . dexamethasone (DECADRON) 10 mg in sodium chloride 0.9 % 50 mL IVPB  10 mg Intravenous Once Nicholas Lose, MD   10 mg at 06/14/17 1258  . heparin lock flush 100 unit/mL  500 Units Intracatheter Once PRN Nicholas Lose, MD      . PACLitaxel (TAXOL) 120 mg in dextrose 5 % 250 mL chemo infusion (</= 74m/m2)  65 mg/m2 (Treatment Plan Recorded) Intravenous Once GNicholas Lose MD      . sodium chloride flush (NS) 0.9 % injection 10 mL  10 mL Intracatheter PRN GNicholas Lose MD      . trastuzumab (HERCEPTIN) 150 mg in sodium chloride 0.9 % 250 mL chemo infusion  150 mg Intravenous Once Magrinat, GVirgie Dad MD        PHYSICAL EXAMINATION: ECOG  PERFORMANCE STATUS: 1 - Symptomatic but completely ambulatory  Vitals:   06/14/17 1125  BP: (!) 156/86  Pulse: 77  Resp: 18  Temp: 98.5 F (36.9 C)  SpO2: 100%   Filed Weights   06/14/17 1125  Weight: 181 lb (82.1 kg)    GENERAL:alert, no distress and comfortable SKIN: skin color, texture, turgor are normal, no rashes or significant lesions EYES: normal, Conjunctiva are pink and non-injected, sclera clear OROPHARYNX:no exudate, no erythema and lips, buccal mucosa, and tongue normal  NECK: supple, thyroid normal size, non-tender, without nodularity LYMPH:  no palpable lymphadenopathy in the cervical, axillary or inguinal LUNGS: clear to auscultation and percussion with normal breathing effort HEART: regular rate & rhythm and no murmurs and no lower extremity edema ABDOMEN:abdomen soft, non-tender and normal bowel sounds MUSCULOSKELETAL:no cyanosis of digits and no clubbing  NEURO: alert & oriented x 3 with fluent speech, burning pain underneath her feet, nails appear to be brittle EXTREMITIES: No lower extremity edema BREAST: No palpable masses or nodules in either right or left breasts. No palpable axillary supraclavicular or infraclavicular adenopathy no breast tenderness or nipple discharge. (exam performed in the presence of a chaperone)  LABORATORY DATA:  I have reviewed  the data as listed   Chemistry      Component Value Date/Time   NA 142 06/14/2017 1102   K 4.1 06/14/2017 1102   CO2 29 06/14/2017 1102   BUN 10.5 06/14/2017 1102   CREATININE 0.9 06/14/2017 1102      Component Value Date/Time   CALCIUM 9.0 06/14/2017 1102   ALKPHOS 97 06/14/2017 1102   AST 36 (H) 06/14/2017 1102   ALT 61 (H) 06/14/2017 1102   BILITOT 0.39 06/14/2017 1102       Lab Results  Component Value Date   WBC 3.3 (L) 06/14/2017   HGB 12.2 06/14/2017   HCT 36.2 06/14/2017   MCV 93.1 06/14/2017   PLT 214 06/14/2017   NEUTROABS 2.0 06/14/2017    ASSESSMENT & PLAN:  Malignant  neoplasm of lower-outer quadrant of left breast of female, estrogen receptor positive ([facility]) Left breast 2:30 position: 2.1 cm irregular solid mass, additional 0.8 cm oval mass in the left breast (fibrocystic); primary mass on biopsy revealed IDC grade 3, ER 100%, PR 80%, Ki-67 25%, HER-2 positive ratio 1.88, copy #6.2, T2 N0 stage II a clinical stage  Treatment plan: 1. Neoadjuvant Taxol Herceptin weekly 12 followed by Herceptin maintenance for 6 months 2. breast conserving surgery with sentinel lymph node biopsy 3. Adjuvant radiation therapy 4. Followed by adjuvant antiestrogen therapy ----------------------------------------------------------------------------------------------------------------------------------------- Current treatment: Taxol Herceptin today iscycle 11 (last cycle) Chemotherapy toxicities: 1 Mild nausea 2. decreasedneutrophil count: Today's ANC is 1.9 3. Mouth sores: Improved. 4. Elevated AST: AST levels are stable  5. Severe hematuria: CT of the abdomen 05/05/2017 revealed circumferential urinary bladder wall thickening with associated inflammatory changes compatible with cystitis. Urology performed a cystoscopy and did not find any lesions. 7.  Neuropathy: Because of burning pain underneath both her feet, I recommended that we stopped chemotherapy after today's treatment.  Breast MRI will need to be scheduled and tumor board follow-up afterwards. We'll arrange an appointment with surgery Return to clinic after breast MRI to review the results.   I spent 25 minutes talking to the patient of which more than half was spent in counseling and coordination of care.  Orders Placed This Encounter  Procedures  . MR BREAST BILATERAL W WO CONTRAST    Standing Status:   Future    Standing Expiration Date:   08/14/2018    Order Specific Question:   If indicated for the ordered procedure, I authorize the administration of contrast media per Radiology protocol    Answer:    Yes    Order Specific Question:   What is the patient's sedation requirement?    Answer:   No Sedation    Order Specific Question:   Does the patient have a pacemaker or implanted devices?    Answer:   No    Order Specific Question:   Radiology Contrast Protocol - do NOT remove file path    Answer:   [email or web address]    Order Specific Question:   Reason for Exam additional comments    Answer:   Post neoadjuvant chemo    Order Specific Question:   Preferred imaging location?    Answer:   [facility] (table limit-350 lbs)   The patient has a good understanding of the overall plan. she agrees with it. she will call with any problems that may develop before the next visit here.   Rulon Eisenmenger, MD 06/14/17

## 2017-06-14 NOTE — Assessment & Plan Note (Addendum)
Left breast 2:30 position: 2.1 cm irregular solid mass, additional 0.8 cm oval mass in the left breast (fibrocystic); primary mass on biopsy revealed IDC grade 3, ER 100%, PR 80%, Ki-67 25%, HER-2 positive ratio 1.88, copy #6.2, T2 N0 stage II a clinical stage  Treatment plan: 1. Neoadjuvant Taxol Herceptin weekly 11 followed by Herceptin maintenance for 6 months 2. breast conserving surgery with sentinel lymph node biopsy 3. Adjuvant radiation therapy 4. Followed by adjuvant antiestrogen therapy ----------------------------------------------------------------------------------------------------------------------------------------- Current treatment: Taxol Herceptin today iscycle 12  Chemotherapy toxicities: 1 Mild nausea 2. decreasedneutrophil count: Today's ANC is 1.9 3. Mouth sores: Improved. 4. Elevated AST: AST levels are stable  5. Severe hematuria: CT of the abdomen 05/05/2017 revealed circumferential urinary bladder wall thickening with associated inflammatory changes compatible with cystitis. Urology performed a cystoscopy and did not find any lesions. 7.  Neuropathy: Transient resumed Taxol  Breast MRI will need to be scheduled and tumor board follow-up afterwards. We'll arrange an appointment with surgery Return to clinic after breast MRI to review the results.

## 2017-06-15 ENCOUNTER — Telehealth: Payer: Self-pay | Admitting: Hematology and Oncology

## 2017-06-15 NOTE — Telephone Encounter (Signed)
Confirmed with patient upcoming MRI appointment for 9/10 arrival time of 645am.

## 2017-06-20 ENCOUNTER — Ambulatory Visit (HOSPITAL_COMMUNITY)
Admission: RE | Admit: 2017-06-20 | Discharge: 2017-06-20 | Disposition: A | Discharge status: Home / Self Care | Payer: BC Managed Care – PPO | Source: Ambulatory Visit | Attending: Hematology and Oncology | Admitting: Hematology and Oncology

## 2017-06-20 DIAGNOSIS — C50512 Malignant neoplasm of lower-outer quadrant of left female breast: Secondary | ICD-10-CM | POA: Insufficient documentation

## 2017-06-20 DIAGNOSIS — Z170 Estrogen receptor positive status [ER+]: Secondary | ICD-10-CM | POA: Diagnosis present

## 2017-06-20 MED ORDER — GADOBENATE DIMEGLUMINE 529 MG/ML IV SOLN
20.0000 mL | Freq: Once | INTRAVENOUS | Status: AC | PRN
  Administered 2017-06-20: 16 mL via INTRAVENOUS

## 2017-06-21 ENCOUNTER — Other Ambulatory Visit: Payer: Self-pay | Admitting: General Surgery

## 2017-06-21 ENCOUNTER — Other Ambulatory Visit: Payer: BC Managed Care – PPO

## 2017-06-21 ENCOUNTER — Ambulatory Visit: Payer: BC Managed Care – PPO

## 2017-06-21 DIAGNOSIS — Z171 Estrogen receptor negative status [ER-]: Principal | ICD-10-CM

## 2017-06-21 DIAGNOSIS — Z170 Estrogen receptor positive status [ER+]: Secondary | ICD-10-CM

## 2017-06-21 DIAGNOSIS — C50412 Malignant neoplasm of upper-outer quadrant of left female breast: Secondary | ICD-10-CM

## 2017-06-22 ENCOUNTER — Ambulatory Visit (HOSPITAL_COMMUNITY): Payer: BC Managed Care – PPO

## 2017-06-23 ENCOUNTER — Ambulatory Visit (HOSPITAL_BASED_OUTPATIENT_CLINIC_OR_DEPARTMENT_OTHER): Payer: BC Managed Care – PPO | Admitting: Hematology and Oncology

## 2017-06-23 DIAGNOSIS — Z170 Estrogen receptor positive status [ER+]: Secondary | ICD-10-CM | POA: Diagnosis not present

## 2017-06-23 DIAGNOSIS — C50512 Malignant neoplasm of lower-outer quadrant of left female breast: Secondary | ICD-10-CM

## 2017-06-23 NOTE — Assessment & Plan Note (Signed)
Left breast 2:30 position: 2.1 cm irregular solid mass, additional 0.8 cm oval mass in the left breast (fibrocystic); primary mass on biopsy revealed IDC grade 3, ER 100%, PR 80%, Ki-67 25%, HER-2 positive ratio 1.88, copy #6.2, T2 N0 stage II a clinical stage  Treatment plan: 1. Neoadjuvant Taxol Herceptin weekly 12 followed by Herceptin maintenance for 6 months 2. breast conserving surgery with sentinel lymph node biopsy 3. Adjuvant radiation therapy 4. Followed by adjuvant antiestrogen therapy ----------------------------------------------------------------------------------------------------------------------------------------- 06/20/17: Breast MRI: Partial response to therapy, mild interval decrease in the size of the left breast mass and a decrease in the intensity of enhancement. Currently measures 1.9 x 1 x 1.6 cm  I reviewed MRI result and reviewed the images as well. Return to clinic after surgery to review the results.

## 2017-06-23 NOTE — Progress Notes (Signed)
Patient Care Team: Orpah Melter, MD as PCP - General (Family Medicine) Excell Seltzer, MD as Consulting Physician (General Surgery) Magrinat, Virgie Dad, MD as Consulting Physician (Oncology) Eppie Gibson, MD as Attending Physician (Radiation Oncology) Huel Cote, NP as Nurse Practitioner (Obstetrics and Gynecology)  DIAGNOSIS:  Encounter Diagnosis  Name Primary?  . Malignant neoplasm of lower-outer quadrant of left breast of female, estrogen receptor positive ([place])     SUMMARY OF ONCOLOGIC HISTORY:   Malignant neoplasm of lower-outer quadrant of left breast of female, estrogen receptor positive ([place])   03/01/2017 Initial Diagnosis    Left breast 2:30 position: 2.1 cm irregular solid mass, additional 0.8 cm oval mass in the left breast (fibrocystic); primary mass on biopsy revealed IDC grade 3, ER 100%, PR 80%, Ki-67 25%, HER-2 positive ratio 1.88, copy #6.2, T2 N0 stage II a clinical stage      03/29/2017 - 06/14/2017 Neo-Adjuvant Chemotherapy    Neoadjuvant chemotherapy with 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  The Multi-Gene Panel offered by [facility] includes sequencing and/or deletion duplication testing of the following 80 genes: ALK, APC, ATM, AXIN2,BAP1,  BARD1, BLM, BMPR1A, BRCA1, BRCA2, BRIP1, CASR, CDC73, CDH1, CDK4, CDKN1B, CDKN1C, CDKN2A (p14ARF), CDKN2A (p16INK4a), CEBPA, CHEK2, CTNNA1, DICER1, DIS3L2, EGFR (c.2369C>T, p.Thr790Met variant only), EPCAM (Deletion/duplication testing only), FH, FLCN, GATA2, GPC3, GREM1 (Promoter region deletion/duplication testing only), HOXB13 (c.251G>A, p.Gly84Glu), HRAS, KIT, MAX, MEN1, MET, MITF (c.952G>A, p.Glu318Lys variant only), MLH1, MSH2, MSH3, MSH6, MUTYH, NBN, NF1, NF2, NTHL1, PALB2, PDGFRA, PHOX2B, PMS2, POLD1, POLE, POT1, PRKAR1A, PTCH1, PTEN, RAD50, RAD51C, RAD51D, RB1, RECQL4, RET, RUNX1, SDHAF2, SDHA (sequence changes only),  SDHB, SDHC, SDHD, SMAD4, SMARCA4, SMARCB1, SMARCE1, STK11, SUFU, TERT, TERT, TMEM127, TP53, TSC1, TSC2, VHL, WRN and WT1.  The report date is April 27, 2017.        CHIEF COMPLIANT: Follow-up to review the breast MRI  INTERVAL HISTORY: Haley Sanchez is a 56 year old with above-mentioned history of left breast cancer who received neoadjuvant chemotherapy and the underwent a breast MRI. She is here today to discuss the results of MRI.   REVIEW OF SYSTEMS:   Constitutional: Denies fevers, chills or abnormal weight loss Eyes: Denies blurriness of vision Ears, nose, mouth, throat, and face: Denies mucositis or sore throat Respiratory: Denies cough, dyspnea or wheezes Cardiovascular: Denies palpitation, chest discomfort Gastrointestinal:  Denies nausea, heartburn or change in bowel habits Skin: Denies abnormal skin rashes Lymphatics: Denies new lymphadenopathy or easy bruising Neurological:Denies numbness, tingling or new weaknesses Behavioral/Psych: Mood is stable, no new changes  Extremities: No lower extremity edema Breast:  denies any pain or lumps or nodules in either breasts All other systems were reviewed with the patient and are negative.  I have reviewed the past medical history, past surgical history, social history and family history with the patient and they are unchanged from previous note.  ALLERGIES:  is allergic to no known allergies.  MEDICATIONS:  Current Outpatient Prescriptions  Medication Sig Dispense Refill  . albuterol (PROVENTIL HFA;VENTOLIN HFA) 108 (90 Base) MCG/ACT inhaler Inhale 2 puffs into the lungs every 6 (six) hours as needed for wheezing or shortness of breath.    Marland Kitchen buPROPion (WELLBUTRIN XL) 300 MG 24 hr tablet Take 300 mg by mouth daily.    Marland Kitchen lidocaine-prilocaine (EMLA) cream Apply to affected area once (Patient taking differently: Apply 1 application topically daily as needed. Apply to  affected area once. Port Access) 30 g 3  . losartan (COZAAR) 100 MG  tablet Take 100 mg by mouth daily.   0  . PARoxetine (PAXIL) 40 MG tablet Take 40 mg by mouth every evening.      No current facility-administered medications for this visit.     PHYSICAL EXAMINATION: ECOG PERFORMANCE STATUS: 1 - Symptomatic but completely ambulatory  Vitals:   06/23/17 1047  BP: (!) 154/87  Pulse: 72  Resp: 18  Temp: 98.2 F (36.8 C)  SpO2: 100%   Filed Weights   06/23/17 1047  Weight: 186 lb 3.2 oz (84.5 kg)    GENERAL:alert, no distress and comfortable SKIN: skin color, texture, turgor are normal, no rashes or significant lesions EYES: normal, Conjunctiva are pink and non-injected, sclera clear OROPHARYNX:no exudate, no erythema and lips, buccal mucosa, and tongue normal  NECK: supple, thyroid normal size, non-tender, without nodularity LYMPH:  no palpable lymphadenopathy in the cervical, axillary or inguinal LUNGS: clear to auscultation and percussion with normal breathing effort HEART: regular rate & rhythm and no murmurs and no lower extremity edema ABDOMEN:abdomen soft, non-tender and normal bowel sounds MUSCULOSKELETAL:no cyanosis of digits and no clubbing  NEURO: alert & oriented x 3 with fluent speech, no focal motor/sensory deficits EXTREMITIES: No lower extremity edema  LABORATORY DATA:  I have reviewed the data as listed   Chemistry      Component Value Date/Time   NA 142 06/14/2017 1102   K 4.1 06/14/2017 1102   CO2 29 06/14/2017 1102   BUN 10.5 06/14/2017 1102   CREATININE 0.9 06/14/2017 1102      Component Value Date/Time   CALCIUM 9.0 06/14/2017 1102   ALKPHOS 97 06/14/2017 1102   AST 36 (H) 06/14/2017 1102   ALT 61 (H) 06/14/2017 1102   BILITOT 0.39 06/14/2017 1102       Lab Results  Component Value Date   WBC 3.3 (L) 06/14/2017   HGB 12.2 06/14/2017   HCT 36.2 06/14/2017   MCV 93.1 06/14/2017   PLT 214 06/14/2017   NEUTROABS 2.0 06/14/2017    ASSESSMENT & PLAN:  Malignant neoplasm of lower-outer quadrant of left  breast of female, estrogen receptor positive ([facility]) Left breast 2:30 position: 2.1 cm irregular solid mass, additional 0.8 cm oval mass in the left breast (fibrocystic); primary mass on biopsy revealed IDC grade 3, ER 100%, PR 80%, Ki-67 25%, HER-2 positive ratio 1.88, copy #6.2, T2 N0 stage II a clinical stage  Treatment plan: 1. Neoadjuvant Taxol Herceptin weekly 12 followed by Herceptin maintenance for 6 months 2. breast conserving surgery with sentinel lymph node biopsy 3. Adjuvant radiation therapy 4. Followed by adjuvant antiestrogen therapy ----------------------------------------------------------------------------------------------------------------------------------------- 06/20/17: Breast MRI: Partial response to therapy, mild interval decrease in the size of the left breast mass and a decrease in the intensity of enhancement. Currently measures 1.9 x 1 x 1.6 cm  I reviewed MRI result and reviewed the images as well. Return to clinic after surgery to review the results.   I spent 25 minutes talking to the patient of which more than half was spent in counseling and coordination of care.  No orders of the defined types were placed in this encounter.  The patient has a good understanding of the overall plan. she agrees with it. she will call with any problems that may develop before the next visit here.   Rulon Eisenmenger, MD 06/23/17

## 2017-06-24 ENCOUNTER — Ambulatory Visit: Payer: BC Managed Care – PPO | Admitting: Hematology and Oncology

## 2017-06-28 ENCOUNTER — Telehealth: Payer: Self-pay

## 2017-06-28 NOTE — Telephone Encounter (Signed)
Returned pt call and lvm with call back number. Pt lvm about some advice on what to do prior to going out of town tomorrow. Pt having some symptoms of head cold, dry non productive cough. Pt last taxol/herceptin chemo 06/14/17. Pt may try otc meds such as claritin/zyrtec to help with congestion, increase hydration, close monitoring of temperature and any increase in mucus production. If its a viral infection, antibiotic will not help. Pt may contact her primary MD for further management or call us back with more questions.

## 2017-06-30 ENCOUNTER — Other Ambulatory Visit: Payer: Self-pay | Admitting: *Deleted

## 2017-06-30 DIAGNOSIS — C50512 Malignant neoplasm of lower-outer quadrant of left female breast: Secondary | ICD-10-CM

## 2017-06-30 DIAGNOSIS — Z170 Estrogen receptor positive status [ER+]: Principal | ICD-10-CM

## 2017-07-01 ENCOUNTER — Telehealth: Payer: Self-pay

## 2017-07-01 NOTE — Telephone Encounter (Signed)
Patient called and rescheduled appointment for 9/27 at 8 am. Per  Patient request

## 2017-07-04 ENCOUNTER — Encounter: Payer: Self-pay | Admitting: Radiation Oncology

## 2017-07-04 ENCOUNTER — Ambulatory Visit: Payer: BC Managed Care – PPO

## 2017-07-05 ENCOUNTER — Ambulatory Visit: Payer: BC Managed Care – PPO

## 2017-07-05 ENCOUNTER — Other Ambulatory Visit: Payer: BC Managed Care – PPO

## 2017-07-05 ENCOUNTER — Other Ambulatory Visit: Payer: Self-pay | Admitting: Hematology and Oncology

## 2017-07-06 ENCOUNTER — Ambulatory Visit (HOSPITAL_BASED_OUTPATIENT_CLINIC_OR_DEPARTMENT_OTHER)
Admission: RE | Admit: 2017-07-06 | Discharge: 2017-07-06 | Disposition: A | Discharge status: Home / Self Care | Payer: BC Managed Care – PPO | Source: Ambulatory Visit | Attending: Internal Medicine | Admitting: Internal Medicine

## 2017-07-06 ENCOUNTER — Ambulatory Visit (HOSPITAL_COMMUNITY)
Admission: RE | Admit: 2017-07-06 | Discharge: 2017-07-06 | Disposition: A | Discharge status: Home / Self Care | Payer: BC Managed Care – PPO | Source: Ambulatory Visit | Attending: Internal Medicine | Admitting: Internal Medicine

## 2017-07-06 VITALS — BP 164/108 | HR 94 | Wt 184.4 lb

## 2017-07-06 DIAGNOSIS — Z170 Estrogen receptor positive status [ER+]: Secondary | ICD-10-CM

## 2017-07-06 DIAGNOSIS — C50512 Malignant neoplasm of lower-outer quadrant of left female breast: Secondary | ICD-10-CM

## 2017-07-06 DIAGNOSIS — I340 Nonrheumatic mitral (valve) insufficiency: Secondary | ICD-10-CM | POA: Diagnosis not present

## 2017-07-06 DIAGNOSIS — I10 Essential (primary) hypertension: Secondary | ICD-10-CM

## 2017-07-06 MED ORDER — SPIRONOLACTONE 25 MG PO TABS: 12.5000 mg | ORAL_TABLET | Freq: Every day | ORAL | 3 refills | Status: DC

## 2017-07-06 NOTE — Patient Instructions (Signed)
Start Spironalactone 12.5 mg (1/2 tab) daily   Get labs done in 1 week Rx given to have done at [facility] has requested that you have an echocardiogram. Echocardiography is a painless test that uses sound waves to create images of your heart. It provides your doctor with information about the size and shape of your heart and how well your heart's chambers and valves are working. This procedure takes approximately one hour. There are no restrictions for this procedure.  Your physician recommends that you schedule a follow-up appointment in: 3 months with an echocardiogram

## 2017-07-06 NOTE — Progress Notes (Signed)
 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 Gudena  for enrollment into the Cardio-Oncology program.  SUMMARY OF ONCOLOGIC HISTORY:       Malignant neoplasm of lower-outer quadrant of left breast of female, estrogen receptor positive (HCC)   03/01/2017 Initial Diagnosis    Left breast 2:30 position: 2.1 cm irregular solid mass, additional 0.8 cm oval mass in the left breast (fibrocystic); primary mass on biopsy revealed IDC grade 3, ER 100%, PR 80%, Ki-67 25%, HER-2 positive ratio 1.88, copy #6.2, T2 N0 stage II a clinical stage        Therapy plan 1. Neoadjuvant Taxol Herceptin weekly 12 followed by Herceptin maintenance for 6 months 2. breast conserving surgery with sentinel lymph node biopsy 3. Adjuvant radiation therapy 4. Followed by adjuvant antiestrogen therapy   Here for f/u. Has finished Taxol. Now about to start Herceptin alone. Doing well. Denies edema, orthopnea or PND. Doesn't know if she snores. Has gained about 15 pounds recently. On medical leave from teaching and tha makes her depressed. BP running high recently. Pending breast surgery in 2 weeks with Dr. Wakefield.   ECHO (03/18/17): 60-65%  Lat s' 9.6, -18.3% Grade II DD - Personally reviewed ECHO (07/06/17): 60%  GLS -19.8% LS 11.3cm/s grade I DD    Past Medical History:  Diagnosis Date  . Anxiety   . Asthma   . Cancer (HCC)    breast cancer  . Depression   . Family history of breast cancer   . Family history of prostate cancer   . Hypertension   . Seasonal allergies     Current Outpatient Prescriptions  Medication Sig Dispense Refill  . albuterol (PROVENTIL HFA;VENTOLIN HFA) 108 (90 Base) MCG/ACT inhaler Inhale 2 puffs into the lungs every 6 (six) hours as needed for wheezing or shortness of breath.    . buPROPion (WELLBUTRIN  XL) 300 MG 24 hr tablet Take 300 mg by mouth daily.    . lidocaine-prilocaine (EMLA) cream Apply to affected area once (Patient taking differently: Apply 1 application topically daily as needed. Apply to affected area once. Port Access) 30 g 3  . losartan (COZAAR) 100 MG tablet Take 100 mg by mouth daily.   0  . PARoxetine (PAXIL) 40 MG tablet Take 40 mg by mouth every evening.      No current facility-administered medications for this encounter.     Allergies  Allergen Reactions  . No Known Allergies       Social History   Social History  . Marital status: Divorced    Spouse name: N/A  . Number of children: N/A  . Years of education: N/A   Occupational History  . Not on file.   Social History Main Topics  . Smoking status: Never Smoker  . Smokeless tobacco: Never Used  . Alcohol use No  . Drug use: Yes    Types: Marijuana     Comment: last use 03/25/17  . Sexual activity: Not Currently   Other Topics Concern  . Not on file   Social History Narrative  . No narrative on file      Family History  Problem Relation Age of Onset  . Heart disease Mother   . Hypertension Mother   . Kidney failure Father   . Diabetes Father   . Hypertension Father   .   Prostate cancer Father 90  . Brain cancer Maternal Grandmother 58  . Prostate cancer Maternal Grandfather        dx in his 68s  . Breast cancer Paternal Grandmother   . Alcohol abuse Maternal Aunt   . Other Maternal Uncle        died in [place]  . Breast cancer Cousin        maternal first cousin dx in her 21s  . Mental retardation Cousin   . Breast cancer Other        maternal second cousin  . Breast cancer Cousin        paternal first cousin  . Breast cancer Cousin        paternal first cousin    Vitals:   07/06/17 1501  BP: (!) 164/108  Pulse: 94  SpO2: 95%  Weight: 184 lb 6.4 oz (83.6 kg)    PHYSICAL EXAM: General:  Well appearing. No resp difficulty HEENT: normal Neck: supple. no JVD. Carotids 2+  bilat; no bruits. No lymphadenopathy or thryomegaly appreciated. Right port-a-cath Cor: PMI nondisplaced. Regular rate & rhythm. No rubs, gallops or murmurs. Lungs: clear Abdomen: soft, nontender, nondistended. No hepatosplenomegaly. No bruits or masses. Good bowel sounds. Extremities: no cyanosis, clubbing, rash, edema Neuro: alert & orientedx3, cranial nerves grossly intact. moves all 4 extremities w/o difficulty. Affect pleasant    ASSESSMENT & PLAN: 1. Left breast cancer -Diagnosed 5/18 -Grade 3, ER 100%, PR 80%, Ki-67 25%, HER-2 positive ratio 1.88, copy #6.2, T2 N0 stage II a clinical stage -I reviewed echos personally. EF and Doppler parameters stable. No HF on exam. Continue Herceptin.   2. HTN - BP remains elevated here and at all Oncology appts with SBPs ~ 150. Will continue losartan 139m daily. Start spiro 12.5.  - Keep weight down. Diet and exercise as she can do.  - If BP stays up can consider sleep study - BMET 1 week  BGlori Bickers MD  3:23 PM

## 2017-07-06 NOTE — Progress Notes (Signed)
  Echocardiogram 2D Echocardiogram has been performed.  Brainard Highfill L Androw 07/06/2017, 2:43 PM

## 2017-07-07 ENCOUNTER — Other Ambulatory Visit: Payer: Self-pay | Admitting: Hematology and Oncology

## 2017-07-07 ENCOUNTER — Ambulatory Visit (HOSPITAL_BASED_OUTPATIENT_CLINIC_OR_DEPARTMENT_OTHER): Payer: BC Managed Care – PPO

## 2017-07-07 ENCOUNTER — Other Ambulatory Visit: Payer: BC Managed Care – PPO

## 2017-07-07 VITALS — BP 142/74 | HR 74 | Temp 98.5°F | Resp 17

## 2017-07-07 DIAGNOSIS — C50512 Malignant neoplasm of lower-outer quadrant of left female breast: Secondary | ICD-10-CM

## 2017-07-07 DIAGNOSIS — Z170 Estrogen receptor positive status [ER+]: Principal | ICD-10-CM

## 2017-07-07 DIAGNOSIS — Z5112 Encounter for antineoplastic immunotherapy: Secondary | ICD-10-CM

## 2017-07-07 MED ORDER — ACETAMINOPHEN 325 MG PO TABS
ORAL_TABLET | ORAL | Status: AC
  Filled 2017-07-07: qty 2

## 2017-07-07 MED ORDER — SODIUM CHLORIDE 0.9 % IV SOLN
Freq: Once | INTRAVENOUS | Status: AC
  Administered 2017-07-07: 08:00:00 via INTRAVENOUS

## 2017-07-07 MED ORDER — ACETAMINOPHEN 325 MG PO TABS
650.0000 mg | ORAL_TABLET | Freq: Once | ORAL | Status: AC
  Administered 2017-07-07: 650 mg via ORAL

## 2017-07-07 MED ORDER — SODIUM CHLORIDE 0.9 % IV SOLN
6.0000 mg/kg | Freq: Once | INTRAVENOUS | Status: AC
  Administered 2017-07-07: 504 mg via INTRAVENOUS
  Filled 2017-07-07: qty 24

## 2017-07-07 MED ORDER — DIPHENHYDRAMINE HCL 25 MG PO CAPS
50.0000 mg | ORAL_CAPSULE | Freq: Once | ORAL | Status: AC
  Administered 2017-07-07: 50 mg via ORAL

## 2017-07-07 MED ORDER — ACETAMINOPHEN 500 MG PO TABS
ORAL_TABLET | ORAL | Status: AC
  Filled 2017-07-07: qty 1

## 2017-07-07 MED ORDER — DIPHENHYDRAMINE HCL 25 MG PO CAPS
ORAL_CAPSULE | ORAL | Status: AC
  Filled 2017-07-07: qty 2

## 2017-07-07 MED ORDER — SODIUM CHLORIDE 0.9% FLUSH
10.0000 mL | INTRAVENOUS | Status: DC | PRN
  Administered 2017-07-07: 10 mL
  Filled 2017-07-07: qty 10

## 2017-07-07 MED ORDER — HEPARIN SOD (PORK) LOCK FLUSH 100 UNIT/ML IV SOLN
500.0000 [IU] | Freq: Once | INTRAVENOUS | Status: AC | PRN
  Administered 2017-07-07: 500 [IU]
  Filled 2017-07-07: qty 5

## 2017-07-07 NOTE — Patient Instructions (Signed)
[facility] Discharge Instructions for Patients Receiving Antibody  Today you received the following Herceptin  To help prevent nausea and vomiting after your treatment, we encourage you to take your nausea medication as directed.  If you develop nausea and vomiting that is not controlled by your nausea medication, call the clinic.   BELOW ARE SYMPTOMS THAT SHOULD BE REPORTED IMMEDIATELY:  *FEVER GREATER THAN 100.5 F  *CHILLS WITH OR WITHOUT FEVER  NAUSEA AND VOMITING THAT IS NOT CONTROLLED WITH YOUR NAUSEA MEDICATION  *UNUSUAL SHORTNESS OF BREATH  *UNUSUAL BRUISING OR BLEEDING  TENDERNESS IN MOUTH AND THROAT WITH OR WITHOUT PRESENCE OF ULCERS  *URINARY PROBLEMS  *BOWEL PROBLEMS  UNUSUAL RASH Items with * indicate a potential emergency and should be followed up as soon as possible.  Feel free to call the clinic should you have any questions or concerns. The clinic phone number is [phone].  Please show the [facility] at check-in to the Emergency Department and triage nurse.

## 2017-07-12 ENCOUNTER — Telehealth: Payer: Self-pay

## 2017-07-12 ENCOUNTER — Other Ambulatory Visit: Payer: Self-pay

## 2017-07-12 DIAGNOSIS — C50512 Malignant neoplasm of lower-outer quadrant of left female breast: Secondary | ICD-10-CM

## 2017-07-12 DIAGNOSIS — Z170 Estrogen receptor positive status [ER+]: Principal | ICD-10-CM

## 2017-07-12 NOTE — Telephone Encounter (Signed)
Spoke with pt regarding her request for a lab appt. Pt states that she started on a new medication from Dr.Benshimohon's office and has a lab order for a BMET check this week. Pt requesting to have this lab performed at the cancer center if its okay.  Discussed with MD and per Dr.Gudena, pt okay to have lab work done early prior to pt herceptin infusion on 10/16. Will fax results to Dr.Bensimhon's office once bmet resulted.  Pt verbalized understanding and confirmed time/date for this week's lab appt 07/15/17 at 9am.

## 2017-07-14 ENCOUNTER — Encounter (HOSPITAL_BASED_OUTPATIENT_CLINIC_OR_DEPARTMENT_OTHER): Payer: Self-pay | Admitting: *Deleted

## 2017-07-15 ENCOUNTER — Other Ambulatory Visit (HOSPITAL_BASED_OUTPATIENT_CLINIC_OR_DEPARTMENT_OTHER): Payer: BC Managed Care – PPO

## 2017-07-15 ENCOUNTER — Telehealth: Payer: Self-pay | Admitting: Hematology and Oncology

## 2017-07-15 ENCOUNTER — Other Ambulatory Visit: Payer: BC Managed Care – PPO

## 2017-07-15 DIAGNOSIS — C50512 Malignant neoplasm of lower-outer quadrant of left female breast: Secondary | ICD-10-CM | POA: Diagnosis not present

## 2017-07-15 DIAGNOSIS — Z170 Estrogen receptor positive status [ER+]: Principal | ICD-10-CM

## 2017-07-15 LAB — CBC WITH DIFFERENTIAL/PLATELET
BASO%: 0.3 % (ref 0.0–2.0)
BASOS ABS: 0 10*3/uL (ref 0.0–0.1)
EOS ABS: 0.1 10*3/uL (ref 0.0–0.5)
EOS%: 2.7 % (ref 0.0–7.0)
HCT: 37.8 % (ref 34.8–46.6)
HGB: 12.7 g/dL (ref 11.6–15.9)
LYMPH%: 28.5 % (ref 14.0–49.7)
MCH: 31.3 pg (ref 25.1–34.0)
MCHC: 33.7 g/dL (ref 31.5–36.0)
MCV: 93 fL (ref 79.5–101.0)
MONO#: 0.3 10*3/uL (ref 0.1–0.9)
MONO%: 8.7 % (ref 0.0–14.0)
NEUT#: 2.2 10*3/uL (ref 1.5–6.5)
NEUT%: 59.8 % (ref 38.4–76.8)
PLATELETS: 194 10*3/uL (ref 145–400)
RBC: 4.06 10*6/uL (ref 3.70–5.45)
RDW: 13.4 % (ref 11.2–14.5)
WBC: 3.6 10*3/uL — AB (ref 3.9–10.3)
lymph#: 1 10*3/uL (ref 0.9–3.3)

## 2017-07-15 LAB — COMPREHENSIVE METABOLIC PANEL
ALT: 21 U/L (ref 0–55)
AST: 19 U/L (ref 5–34)
Albumin: 3.9 g/dL (ref 3.5–5.0)
Alkaline Phosphatase: 103 U/L (ref 40–150)
Anion Gap: 8 mEq/L (ref 3–11)
BUN: 12.5 mg/dL (ref 7.0–26.0)
CALCIUM: 9.2 mg/dL (ref 8.4–10.4)
CHLORIDE: 105 meq/L (ref 98–109)
CO2: 29 mEq/L (ref 22–29)
CREATININE: 1 mg/dL (ref 0.6–1.1)
EGFR: 61 mL/min/{1.73_m2} — ABNORMAL LOW (ref >90)
Glucose: 85 mg/dl (ref 70–140)
POTASSIUM: 4.3 meq/L (ref 3.5–5.1)
SODIUM: 141 meq/L (ref 136–145)
Total Bilirubin: 0.3 mg/dL (ref 0.20–1.20)
Total Protein: 6.8 g/dL (ref 6.4–8.3)

## 2017-07-15 NOTE — Telephone Encounter (Signed)
Spoke with patient and moved her lab to a later time slot

## 2017-07-15 NOTE — Pre-Procedure Instructions (Signed)
Ensure pre-surgery drink 10 oz. given to pt. with instructions to drink by 0815 DOS. Pt. voiced understanding.

## 2017-07-18 ENCOUNTER — Encounter: Payer: Self-pay | Admitting: Hematology and Oncology

## 2017-07-21 ENCOUNTER — Ambulatory Visit (HOSPITAL_COMMUNITY)
Admission: RE | Admit: 2017-07-21 | Discharge: 2017-07-21 | Disposition: A | Discharge status: Home / Self Care | Payer: BC Managed Care – PPO | Source: Ambulatory Visit | Attending: General Surgery | Admitting: General Surgery

## 2017-07-21 ENCOUNTER — Ambulatory Visit (HOSPITAL_BASED_OUTPATIENT_CLINIC_OR_DEPARTMENT_OTHER): Payer: BC Managed Care – PPO | Admitting: Anesthesiology

## 2017-07-21 ENCOUNTER — Encounter (HOSPITAL_BASED_OUTPATIENT_CLINIC_OR_DEPARTMENT_OTHER)
Admission: RE | Disposition: A | Discharge status: Home / Self Care | Payer: Self-pay | Source: Ambulatory Visit | Attending: General Surgery

## 2017-07-21 ENCOUNTER — Ambulatory Visit (HOSPITAL_BASED_OUTPATIENT_CLINIC_OR_DEPARTMENT_OTHER)
Admission: RE | Admit: 2017-07-21 | Discharge: 2017-07-21 | Disposition: A | Discharge status: Home / Self Care | Payer: BC Managed Care – PPO | Source: Ambulatory Visit | Attending: General Surgery | Admitting: General Surgery

## 2017-07-21 ENCOUNTER — Encounter (HOSPITAL_BASED_OUTPATIENT_CLINIC_OR_DEPARTMENT_OTHER): Payer: Self-pay | Admitting: *Deleted

## 2017-07-21 DIAGNOSIS — I10 Essential (primary) hypertension: Secondary | ICD-10-CM | POA: Insufficient documentation

## 2017-07-21 DIAGNOSIS — Z79899 Other long term (current) drug therapy: Secondary | ICD-10-CM | POA: Diagnosis not present

## 2017-07-21 DIAGNOSIS — C50412 Malignant neoplasm of upper-outer quadrant of left female breast: Secondary | ICD-10-CM

## 2017-07-21 DIAGNOSIS — Z171 Estrogen receptor negative status [ER-]: Principal | ICD-10-CM

## 2017-07-21 DIAGNOSIS — C50912 Malignant neoplasm of unspecified site of left female breast: Secondary | ICD-10-CM | POA: Diagnosis not present

## 2017-07-21 HISTORY — DX: Other specified postprocedural states: R11.2

## 2017-07-21 HISTORY — DX: Other complications of anesthesia, initial encounter: T88.59XA

## 2017-07-21 HISTORY — DX: Adverse effect of unspecified anesthetic, initial encounter: T41.45XA

## 2017-07-21 HISTORY — DX: Other specified postprocedural states: Z98.890

## 2017-07-21 HISTORY — PX: BREAST LUMPECTOMY WITH RADIOACTIVE SEED AND SENTINEL LYMPH NODE BIOPSY: SHX6550

## 2017-07-21 SURGERY — BREAST LUMPECTOMY WITH RADIOACTIVE SEED AND SENTINEL LYMPH NODE BIOPSY
Anesthesia: General | Site: Breast | Laterality: Left

## 2017-07-21 MED ORDER — MIDAZOLAM HCL 2 MG/2ML IJ SOLN
1.0000 mg | INTRAMUSCULAR | Status: DC | PRN
  Administered 2017-07-21: 2 mg via INTRAVENOUS

## 2017-07-21 MED ORDER — FENTANYL CITRATE (PF) 100 MCG/2ML IJ SOLN
INTRAMUSCULAR | Status: DC | PRN
  Administered 2017-07-21: 100 ug via INTRAVENOUS
  Administered 2017-07-21: 25 ug via INTRAVENOUS

## 2017-07-21 MED ORDER — ACETAMINOPHEN 500 MG PO TABS
ORAL_TABLET | ORAL | Status: AC
  Filled 2017-07-21: qty 2

## 2017-07-21 MED ORDER — CEFAZOLIN SODIUM-DEXTROSE 2-4 GM/100ML-% IV SOLN
INTRAVENOUS | Status: AC
  Filled 2017-07-21: qty 100

## 2017-07-21 MED ORDER — PHENYLEPHRINE 40 MCG/ML (10ML) SYRINGE FOR IV PUSH (FOR BLOOD PRESSURE SUPPORT)
PREFILLED_SYRINGE | INTRAVENOUS | Status: AC
  Filled 2017-07-21: qty 10

## 2017-07-21 MED ORDER — GABAPENTIN 300 MG PO CAPS
ORAL_CAPSULE | ORAL | Status: AC
  Filled 2017-07-21: qty 1

## 2017-07-21 MED ORDER — PROPOFOL 10 MG/ML IV BOLUS
INTRAVENOUS | Status: DC | PRN
  Administered 2017-07-21: 150 mg via INTRAVENOUS

## 2017-07-21 MED ORDER — PROMETHAZINE HCL 25 MG/ML IJ SOLN: 6.2500 mg | INTRAMUSCULAR | Status: DC | PRN

## 2017-07-21 MED ORDER — FENTANYL CITRATE (PF) 100 MCG/2ML IJ SOLN
INTRAMUSCULAR | Status: AC
  Filled 2017-07-21: qty 2

## 2017-07-21 MED ORDER — DEXAMETHASONE SODIUM PHOSPHATE 4 MG/ML IJ SOLN
INTRAMUSCULAR | Status: DC | PRN
  Administered 2017-07-21: 10 mg via INTRAVENOUS

## 2017-07-21 MED ORDER — CELECOXIB 200 MG PO CAPS
ORAL_CAPSULE | ORAL | Status: AC
  Filled 2017-07-21: qty 1

## 2017-07-21 MED ORDER — EPHEDRINE 5 MG/ML INJ
INTRAVENOUS | Status: AC
  Filled 2017-07-21: qty 10

## 2017-07-21 MED ORDER — PHENYLEPHRINE HCL 10 MG/ML IJ SOLN
INTRAMUSCULAR | Status: DC | PRN
  Administered 2017-07-21 (×3): 80 ug via INTRAVENOUS

## 2017-07-21 MED ORDER — FENTANYL CITRATE (PF) 100 MCG/2ML IJ SOLN
50.0000 ug | INTRAMUSCULAR | Status: DC | PRN
  Administered 2017-07-21: 100 ug via INTRAVENOUS

## 2017-07-21 MED ORDER — TECHNETIUM TC 99M SULFUR COLLOID FILTERED
1.0000 | Freq: Once | INTRAVENOUS | Status: AC | PRN
  Administered 2017-07-21: 1 via INTRADERMAL

## 2017-07-21 MED ORDER — OXYCODONE HCL 5 MG PO TABS: 5.0000 mg | ORAL_TABLET | Freq: Once | ORAL | Status: DC | PRN

## 2017-07-21 MED ORDER — ONDANSETRON HCL 4 MG/2ML IJ SOLN
INTRAMUSCULAR | Status: DC | PRN
  Administered 2017-07-21: 4 mg via INTRAVENOUS

## 2017-07-21 MED ORDER — SCOPOLAMINE 1 MG/3DAYS TD PT72: 1.0000 | MEDICATED_PATCH | Freq: Once | TRANSDERMAL | Status: DC | PRN

## 2017-07-21 MED ORDER — CELECOXIB 200 MG PO CAPS
200.0000 mg | ORAL_CAPSULE | ORAL | Status: AC
  Administered 2017-07-21: 200 mg via ORAL

## 2017-07-21 MED ORDER — BUPIVACAINE HCL (PF) 0.25 % IJ SOLN
INTRAMUSCULAR | Status: DC | PRN
  Administered 2017-07-21: 5 mL

## 2017-07-21 MED ORDER — MIDAZOLAM HCL 2 MG/2ML IJ SOLN
INTRAMUSCULAR | Status: AC
  Filled 2017-07-21: qty 2

## 2017-07-21 MED ORDER — LIDOCAINE HCL (CARDIAC) 20 MG/ML IV SOLN
INTRAVENOUS | Status: DC | PRN
  Administered 2017-07-21: 30 mg via INTRAVENOUS

## 2017-07-21 MED ORDER — METHYLENE BLUE 0.5 % INJ SOLN
INTRAVENOUS | Status: DC | PRN
  Administered 2017-07-21: 4 mL

## 2017-07-21 MED ORDER — BUPIVACAINE-EPINEPHRINE (PF) 0.5% -1:200000 IJ SOLN
INTRAMUSCULAR | Status: DC | PRN
  Administered 2017-07-21: 30 mL

## 2017-07-21 MED ORDER — OXYCODONE HCL 5 MG PO TABS: 5.0000 mg | ORAL_TABLET | Freq: Four times a day (QID) | ORAL | 0 refills | Status: DC | PRN

## 2017-07-21 MED ORDER — MIDAZOLAM HCL 5 MG/5ML IJ SOLN
INTRAMUSCULAR | Status: DC | PRN
  Administered 2017-07-21: 2 mg via INTRAVENOUS

## 2017-07-21 MED ORDER — OXYCODONE HCL 5 MG/5ML PO SOLN: 5.0000 mg | Freq: Once | ORAL | Status: DC | PRN

## 2017-07-21 MED ORDER — ACETAMINOPHEN 500 MG PO TABS
1000.0000 mg | ORAL_TABLET | ORAL | Status: AC
  Administered 2017-07-21: 1000 mg via ORAL

## 2017-07-21 MED ORDER — HYDROMORPHONE HCL 1 MG/ML IJ SOLN: 0.2500 mg | INTRAMUSCULAR | Status: DC | PRN

## 2017-07-21 MED ORDER — CEFAZOLIN SODIUM-DEXTROSE 2-4 GM/100ML-% IV SOLN
2.0000 g | INTRAVENOUS | Status: AC
  Administered 2017-07-21: 2 g via INTRAVENOUS

## 2017-07-21 MED ORDER — GABAPENTIN 300 MG PO CAPS
300.0000 mg | ORAL_CAPSULE | ORAL | Status: AC
  Administered 2017-07-21: 300 mg via ORAL

## 2017-07-21 MED ORDER — LACTATED RINGERS IV SOLN
INTRAVENOUS | Status: DC
  Administered 2017-07-21 (×2): via INTRAVENOUS

## 2017-07-21 MED ORDER — MEPERIDINE HCL 25 MG/ML IJ SOLN: 6.2500 mg | INTRAMUSCULAR | Status: DC | PRN

## 2017-07-21 MED ORDER — EPHEDRINE SULFATE 50 MG/ML IJ SOLN
INTRAMUSCULAR | Status: DC | PRN
  Administered 2017-07-21 (×3): 10 mg via INTRAVENOUS
  Administered 2017-07-21: 25 mg via INTRAVENOUS

## 2017-07-21 SURGICAL SUPPLY — 48 items
APPLIER CLIP 9.375 MED OPEN (MISCELLANEOUS) ×3
BINDER BREAST LRG (GAUZE/BANDAGES/DRESSINGS) ×3 IMPLANT
BLADE SURG 15 STRL LF DISP TIS (BLADE) ×1 IMPLANT
BLADE SURG 15 STRL SS (BLADE) ×2
CANISTER SUCT 1200ML W/VALVE (MISCELLANEOUS) ×3 IMPLANT
CHLORAPREP W/TINT 26ML (MISCELLANEOUS) ×3 IMPLANT
CLIP APPLIE 9.375 MED OPEN (MISCELLANEOUS) ×1 IMPLANT
CLIP VESOCCLUDE SM WIDE 6/CT (CLIP) ×3 IMPLANT
CLOSURE WOUND 1/2 X4 (GAUZE/BANDAGES/DRESSINGS) ×1
COVER BACK TABLE 60X90IN (DRAPES) ×3 IMPLANT
COVER MAYO STAND STRL (DRAPES) ×3 IMPLANT
COVER PROBE W GEL 5X96 (DRAPES) ×3 IMPLANT
DERMABOND ADVANCED (GAUZE/BANDAGES/DRESSINGS) ×2
DERMABOND ADVANCED .7 DNX12 (GAUZE/BANDAGES/DRESSINGS) ×1 IMPLANT
DEVICE DUBIN W/COMP PLATE 8390 (MISCELLANEOUS) ×3 IMPLANT
DRAPE LAPAROSCOPIC ABDOMINAL (DRAPES) ×3 IMPLANT
DRAPE UTILITY XL STRL (DRAPES) ×3 IMPLANT
ELECT COATED BLADE 2.86 ST (ELECTRODE) ×3 IMPLANT
ELECT REM PT RETURN 9FT ADLT (ELECTROSURGICAL) ×3
ELECTRODE REM PT RTRN 9FT ADLT (ELECTROSURGICAL) ×1 IMPLANT
GLOVE BIO SURGEON STRL SZ7 (GLOVE) ×6 IMPLANT
GLOVE BIOGEL PI IND STRL 7.5 (GLOVE) ×1 IMPLANT
GLOVE BIOGEL PI INDICATOR 7.5 (GLOVE) ×2
GOWN STRL REUS W/ TWL LRG LVL3 (GOWN DISPOSABLE) ×2 IMPLANT
GOWN STRL REUS W/TWL LRG LVL3 (GOWN DISPOSABLE) ×4
ILLUMINATOR WAVEGUIDE N/F (MISCELLANEOUS) ×3 IMPLANT
KIT MARKER MARGIN INK (KITS) ×3 IMPLANT
NDL SAFETY ECLIPSE 18X1.5 (NEEDLE) ×1 IMPLANT
NEEDLE HYPO 18GX1.5 SHARP (NEEDLE) ×2
NEEDLE HYPO 25X1 1.5 SAFETY (NEEDLE) ×6 IMPLANT
NS IRRIG 1000ML POUR BTL (IV SOLUTION) ×3 IMPLANT
PACK BASIN DAY SURGERY FS (CUSTOM PROCEDURE TRAY) ×3 IMPLANT
PENCIL BUTTON HOLSTER BLD 10FT (ELECTRODE) ×6 IMPLANT
SLEEVE SCD COMPRESS KNEE MED (MISCELLANEOUS) ×3 IMPLANT
SPONGE LAP 4X18 X RAY DECT (DISPOSABLE) ×6 IMPLANT
STRIP CLOSURE SKIN 1/2X4 (GAUZE/BANDAGES/DRESSINGS) ×2 IMPLANT
SUT MNCRL AB 4-0 PS2 18 (SUTURE) ×3 IMPLANT
SUT MON AB 5-0 PS2 18 (SUTURE) ×3 IMPLANT
SUT VIC AB 2-0 SH 27 (SUTURE) ×4
SUT VIC AB 2-0 SH 27XBRD (SUTURE) ×2 IMPLANT
SUT VIC AB 3-0 SH 27 (SUTURE) ×4
SUT VIC AB 3-0 SH 27X BRD (SUTURE) ×2 IMPLANT
SYR CONTROL 10ML LL (SYRINGE) ×3 IMPLANT
TOWEL OR 17X24 6PK STRL BLUE (TOWEL DISPOSABLE) ×3 IMPLANT
TOWEL OR NON WOVEN STRL DISP B (DISPOSABLE) ×3 IMPLANT
TUBE CONNECTING 20'X1/4 (TUBING) ×1
TUBE CONNECTING 20X1/4 (TUBING) ×2 IMPLANT
YANKAUER SUCT BULB TIP NO VENT (SUCTIONS) ×3 IMPLANT

## 2017-07-21 NOTE — Anesthesia Preprocedure Evaluation (Signed)
Anesthesia Evaluation  Patient identified by MRN, date of birth, ID band Patient awake    Reviewed: Allergy & Precautions, H&P , NPO status , Patient's Chart, lab work & pertinent test results  History of Anesthesia Complications (+) PONV and history of anesthetic complications  Airway Mallampati: I  TM Distance: >3 FB Neck ROM: full    Dental no notable dental hx. (+) Teeth Intact, Dental Advidsory Given   Pulmonary neg pulmonary ROS, asthma ,    Pulmonary exam normal        Cardiovascular hypertension, On Medications Normal cardiovascular exam Rhythm:regular Rate:Normal     Neuro/Psych PSYCHIATRIC DISORDERS negative neurological ROS  negative psych ROS   GI/Hepatic negative GI ROS, Neg liver ROS,   Endo/Other  negative endocrine ROS  Renal/GU negative Renal ROS     Musculoskeletal negative musculoskeletal ROS (+)   Abdominal   Peds  Hematology negative hematology ROS (+)   Anesthesia Other Findings   Reproductive/Obstetrics negative OB ROS                             Anesthesia Physical  Anesthesia Plan  ASA: II  Anesthesia Plan: General   Post-op Pain Management:    Induction:   PONV Risk Score and Plan: 4 or greater and Ondansetron, Dexamethasone, Midazolam, Scopolamine patch - Pre-op and Propofol infusion  Airway Management Planned: LMA  Additional Equipment:   Intra-op Plan:   Post-operative Plan: Extubation in OR  Informed Consent: I have reviewed the patients History and Physical, chart, labs and discussed the procedure including the risks, benefits and alternatives for the proposed anesthesia with the patient or authorized representative who has indicated his/her understanding and acceptance.   Dental Advisory Given  Plan Discussed with: CRNA  Anesthesia Plan Comments:         Anesthesia Quick Evaluation

## 2017-07-21 NOTE — Transfer of Care (Signed)
Immediate Anesthesia Transfer of Care Note  Patient: Haley Sanchez  Procedure(s) Performed: BREAST LUMPECTOMY WITH RADIOACTIVE SEED AND SENTINEL LYMPH NODE BIOPSY (Left Breast)  Patient Location: PACU  Anesthesia Type:General and GA combined with regional for post-op pain  Level of Consciousness: awake and alert   Airway & Oxygen Therapy: Patient Spontanous Breathing and Patient connected to face mask oxygen  Post-op Assessment: Report given to RN and Post -op Vital signs reviewed and stable  Post vital signs: Reviewed and stable  Last Vitals:  Vitals:   07/21/17 1213 07/21/17 1215  BP:  130/73  Pulse: 74 67  Resp:  16  Temp:    SpO2: 100% 99%    Last Pain:  Vitals:   07/21/17 1054  TempSrc: Oral  PainSc: 0-No pain         Complications: No apparent anesthesia complications

## 2017-07-21 NOTE — Progress Notes (Signed)
Assisted Dr. Germeroth with left, ultrasound guided, pectoralis block. Side rails up, monitors on throughout procedure. See vital signs in flow sheet. Tolerated Procedure well. 

## 2017-07-21 NOTE — Anesthesia Procedure Notes (Signed)
Anesthesia Regional Block: Pectoralis block   Pre-Anesthetic Checklist: ,, timeout performed, Correct Patient, Correct Site, Correct Laterality, Correct Procedure, Correct Position, site marked, Risks and benefits discussed,  Surgical consent,  Pre-op evaluation,  At surgeon's request and post-op pain management  Laterality: Left  Prep: chloraprep       Needles:   Needle Type: Stimiplex     Needle Length: 9cm      Additional Needles:   Narrative:  Start time: 07/21/2017 12:10 PM End time: 07/21/2017 12:13 PM Injection made incrementally with aspirations every 5 mL.  Performed by: Personally  Anesthesiologist: Nolon Nations  Additional Notes: Patient tolerated well. Good fascial spread noted.

## 2017-07-21 NOTE — Op Note (Addendum)
Preoperative diagnosis: left breast cancer s/p primary chemotherapy Postoperative diagnosis: same as above Procedure: Leftbreast seed guided lumpectomy Leftdeep axillary sentinel node biopsy Injection blue dye for sentinel node identification Surgeon: Dr Serita Grammes EBL: 30 cc Anes: general  Specimens  1. Left breast tissue marked with paint 2. Leftaxillary sentinel nodes with highest count 879, one blue dye Complications none Drains none Sponge count correct Dispo to pacu stable  Indications: This is a 7 yof with triple positive left breast cancer. Decision made with oncology to proceed with primary chemotherapy and antiher2 therapy.  She has modest response radiologically. Discussed lumpectomy and sentinel node biopsy with patient.  She had seed placed prior to surgery and I had these mammograms in the OR.  Procedure: After informed consent was obtained the patient was taken to the operating room. She first was given technetium in standard periareolar fashion. She had a pectoral block. She was given antibiotics. Sequential compression devices were on her legs. She was then placed under general anesthesia with an LMA. Then she was prepped and draped in the standard sterile surgical fashion. Surgical timeout was then performed.  I infiltrated a mixture of methylene blue dye and saline in periareolar position and massaged this for later sentinel node identification due to chemotherapy first. I then located the seed in the lateral left breast. I infiltrated marcaine in the skin and then made a periareolar incision in an attempt to hide the scar later.  I tunneled to the seed and the mass using the lighted retractor. I then used the neoprobe to remove the seed and the surrounding tissue with attempt to get clear margins. I marked this with paint. MM confirmed removal of seed and clip.I placed clips in the cavity.I then obtained hemostasis. I approximated the breast tissue with  2-0 vicryl.  The skin was closed with 3-0 vicryl and 5-0 monocryl. Glue and steristrips were applied. I then infiltrated marcaine in the low axilla and made an incision below the hairline.  carried this through the axillary fascia. I then located the sentinel nodes. I excised the radioactive nodes. One of these was also blue. There were no enlarged nodes. The background radioactivity was zero. I then obtained hemostasis. I closed the axillary fascia with 2-0 vicryl/  The skin was closed with 3-0 vicryl and 4-0 monocryl. Glue and steristrips were applied. A binder was placed. She was extubated and transferred to PACU.

## 2017-07-21 NOTE — Discharge Instructions (Signed)
[facility] °Office Phone Number [phone] ° °POST OP INSTRUCTIONS ° °Always review your discharge instruction sheet given to you by the facility where your surgery was performed. ° °IF YOU HAVE DISABILITY OR FAMILY LEAVE FORMS, YOU MUST BRING THEM TO THE OFFICE FOR PROCESSING.  DO NOT GIVE THEM TO YOUR DOCTOR. ° °1. A prescription for pain medication may be given to you upon discharge.  Take your pain medication as prescribed, if needed.  If narcotic pain medicine is not needed, then you may take acetaminophen (Tylenol), naprosyn (Alleve) or ibuprofen (Advil) as needed. °2. Take your usually prescribed medications unless otherwise directed °3. If you need a refill on your pain medication, please contact your pharmacy.  They will contact our office to request authorization.  Prescriptions will not be filled after 5pm or on week-ends. °4. You should eat very light the first 24 hours after surgery, such as soup, crackers, pudding, etc.  Resume your normal diet the day after surgery. °5. Most patients will experience some swelling and bruising in the breast.  Ice packs and a good support bra will help.  Wear the breast binder provided or a sports bra for 72 hours day and night.  After that wear a sports bra during the day until you return to the office. Swelling and bruising can take several days to resolve.  °6. It is common to experience some constipation if taking pain medication after surgery.  Increasing fluid intake and taking a stool softener will usually help or prevent this problem from occurring.  A mild laxative (Milk of Magnesia or Miralax) should be taken according to package directions if there are no bowel movements after 48 hours. °7. Unless discharge instructions indicate otherwise, you may remove your bandages 48 hours after surgery and you may shower at that time.  You may have steri-strips (small skin tapes) in place directly over the incision.  These strips should be left on the  skin for 7-10 days and will come off on their own.  If your surgeon used skin glue on the incision, you may shower in 24 hours.  The glue will flake off over the next 2-3 weeks.  Any sutures or staples will be removed at the office during your follow-up visit. °8. ACTIVITIES:  You may resume regular daily activities (gradually increasing) beginning the next day.  Wearing a good support bra or sports bra minimizes pain and swelling.  You may have sexual intercourse when it is comfortable. °a. You may drive when you no longer are taking prescription pain medication, you can comfortably wear a seatbelt, and you can safely maneuver your car and apply brakes. °b. RETURN TO WORK:  ______________________________________________________________________________________ °9. You should see your doctor in the office for a follow-up appointment approximately two weeks after your surgery.  Your doctor’s nurse will typically make your follow-up appointment when she calls you with your pathology report.  Expect your pathology report 3-4 business days after your surgery.  You may call to check if you do not hear from us after three days. °10. OTHER INSTRUCTIONS: _______________________________________________________________________________________________ _____________________________________________________________________________________________________________________________________ °_____________________________________________________________________________________________________________________________________ °_____________________________________________________________________________________________________________________________________ ° °WHEN TO CALL DR WAKEFIELD: °1. Fever over 101.0 °2. Nausea and/or vomiting. °3. Extreme swelling or bruising. °4. Continued bleeding from incision. °5. Increased pain, redness, or drainage from the incision. ° °The clinic staff is available to answer your questions during regular  business hours.  Please don’t hesitate to call and ask to speak to one of the nurses for clinical concerns.  If   you have a medical emergency, go to the nearest emergency room or call 911.  A surgeon from [facility] is always on call at the hospital. ° °For further questions, please visit [email or web address] ° ° ° ° ° °Post Anesthesia Home Care Instructions ° °Activity: °Get plenty of rest for the remainder of the day. A responsible individual must stay with you for 24 hours following the procedure.  °For the next 24 hours, DO NOT: °-Drive a car °-Operate machinery °-Drink alcoholic beverages °-Take any medication unless instructed by your physician °-Make any legal decisions or sign important papers. ° °Meals: °Start with liquid foods such as gelatin or soup. Progress to regular foods as tolerated. Avoid greasy, spicy, heavy foods. If nausea and/or vomiting occur, drink only clear liquids until the nausea and/or vomiting subsides. Call your physician if vomiting continues. ° °Special Instructions/Symptoms: °Your throat may feel dry or sore from the anesthesia or the breathing tube placed in your throat during surgery. If this causes discomfort, gargle with warm salt water. The discomfort should disappear within 24 hours. ° °If you had a scopolamine patch placed behind your ear for the management of post- operative nausea and/or vomiting: ° °1. The medication in the patch is effective for 72 hours, after which it should be removed.  Wrap patch in a tissue and discard in the trash. Wash hands thoroughly with soap and water. °2. You may remove the patch earlier than 72 hours if you experience unpleasant side effects which may include dry mouth, dizziness or visual disturbances. °3. Avoid touching the patch. Wash your hands with soap and water after contact with the patch. °  ° °

## 2017-07-21 NOTE — H&P (Signed)
7 yof who was seen recently in [facility] with new left breast cancer. she palpated this mass and underwent a mm that shows b density breasts. there is a 1.6 cm round mass with spiculated margin 5 cm from nipple that is at 3 oclock. she also underwent US that shows a 2.1 cm irregular mass in the left breast with another 0.8 cm possibly intraductal mass at 4 oclock 1 cm from nipple. there are 3 normal appearing nodes in the left axilla. she has no prior breast issues. 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 biopsy of second area was fcc and this is apparently concordant. mri shows a 2 cm biopsy proven cancer, no other disease on left, postbiopsy change from other biopsy , no abnl nodes and no right breast abnormality. she has undergone primary chemotherapy finished on 9/4. she had some hematuria evaluated by urology that is negative. she also had some lower gi bleed that improved and was thought to be diverticular in nature. she has repeat mri that shows reduction in size to 1.9x1x1.6 cm in size. left side is negative and normal nodes. she is here to discuss surgery  Past Surgical History Rolm Bookbinder, MD; 06/21/2017 4:04 PM) Breast Biopsy  Left.  Diagnostic Studies History Rolm Bookbinder, MD; 06/21/2017 4:04 PM) Colonoscopy  >10 years ago Mammogram  within last year  Allergies (Tanisha A. Owens Shark, [place]; 06/21/2017 2:26 PM) No Known Drug Allergies 03/09/2017 Allergies Reconciled   Medication History (Tanisha A. Owens Shark, [place]; 06/21/2017 2:28 PM) PARoxetine HCl (40MG  Tablet, Oral) Active. Albuterol (90MCG/ACT Aerosol Soln, Inhalation) Active. BuPROPion HCl ER (XL) (300MG  Tablet ER 24HR, Oral daily) Active. Losartan Potassium (100MG  Tablet, Oral daily) Active. Medications Reconciled  Social History Rolm Bookbinder, MD; 06/21/2017 4:04 PM) Alcohol use  Occasional alcohol use. No alcohol use  Caffeine use  Coffee. No drug use  Current tobacco use   Never smoker. Tobacco use  Never smoker.  Family History Rolm Bookbinder, MD; 06/21/2017 4:04 PM) Alcohol Abuse  Father, Son. Breast Cancer  Paternal Grandmother. Heart Disease  Mother. Kidney Disease  Father. Melanoma  Brother, Father, Mother. Prostate Cancer  Father.  Vitals (Tanisha A. Brown RMA; 06/21/2017 2:26 PM) 06/21/2017 2:25 PM Weight: 185.4 lb Height: 66in Body Surface Area: 1.94 m Body Mass Index: 29.92 kg/m  Temp.: 98.71F  Pulse: 95 (Regular)  P.OX: 98% (Room air) BP: 142/88 (Sitting, Left Arm, Standard) Physical Exam Rolm Bookbinder MD; 06/21/2017 4:01 PM) General Mental Status-Alert. Orientation-Oriented X3.  Head and Neck Trachea-midline. Thyroid Gland Characteristics - normal size and consistency.  Eye Sclera/Conjunctiva - Bilateral-No scleral icterus.  Chest and Lung Exam Chest and lung exam reveals -quiet, even and easy respiratory effort with no use of accessory muscles and on auscultation, normal breath sounds, no adventitious sounds and normal vocal resonance.  Breast Nipples-No Discharge. Breast Lump-No Palpable Breast Mass.  Cardiovascular Cardiovascular examination reveals -normal heart sounds, regular rate and rhythm with no murmurs and no digital clubbing, cyanosis, edema, increased warmth or tenderness.  Lymphatic Head & Neck  General Head & Neck Lymphatics: Bilateral - Description - Normal. Axillary  General Axillary Region: Bilateral - Description - Normal. Note: no Reserve adenopathy   Assessment & Plan Rolm Bookbinder MD; 06/21/2017 4:00 PM) MALIGNANT NEOPLASM OF UPPER-OUTER QUADRANT OF LEFT BREAST IN FEMALE, ESTROGEN RECEPTOR POSITIVE (C50.412) Story: Left breast seed guided lumpectomy, left axillary sentinel node biopsy has moderate response to chemo as expected with triple positive tumor. We discussed a  sentinel lymph node biopsy as she does not appear to having lymph node involvement right  now. We discussed the performance of that with injection of radioactive tracer and blue dye. We discussed that she would have an incision underneath her axillary hairline or would be done through the same incision. We discussed that there is a chance of having a positive node with a sentinel lymph node biopsy and we will await the permanent pathology to make any other first further decisions in terms of her treatment. One of these options might be to return to the operating room to perform an axillary lymph node dissection. We discussed up to a 5% risk lifetime of chronic shoulder pain as well as lymphedema associated with a sentinel lymph node biopsy. We discussed the options for treatment of the breast cancer which included lumpectomy versus a mastectomy. We discussed the performance of the lumpectomy with radioactive seed placement. We discussed a 5-10% chance of a positive margin requiring reexcision in the operating room. We also discussed that she will need radiation therapy if she undergoes lumpectomy. We discussed the mastectomy (removal of whole breast) and the postoperative care for that as well. Mastectomy can be followed by reconstruction. This is a more extensive surgery and requires more recovery. The decision for lumpectomy vs mastectomy has no impact on decision for chemotherapy. Most mastectomy patients will not need radiation therapy. We discussed that there is no difference in her survival whether she undergoes lumpectomy with radiation therapy or antiestrogen therapy versus a mastectomy. There is also no real difference between her recurrence in the breast. We discussed the risks of operation including bleeding, infection, possible reoperation.

## 2017-07-21 NOTE — Anesthesia Postprocedure Evaluation (Signed)
Anesthesia Post Note  Patient: Haley Sanchez  Procedure(s) Performed: BREAST LUMPECTOMY WITH RADIOACTIVE SEED AND SENTINEL LYMPH NODE BIOPSY (Left Breast)     Patient location during evaluation: PACU Anesthesia Type: General Level of consciousness: sedated and patient cooperative Pain management: pain level controlled Vital Signs Assessment: post-procedure vital signs reviewed and stable Respiratory status: spontaneous breathing Cardiovascular status: stable Anesthetic complications: no    Last Vitals:  Vitals:   07/21/17 1415 07/21/17 1440  BP: 140/90 (!) 151/92  Pulse: 96 98  Resp: 15 16  Temp:  37.1 C  SpO2: 100% 98%    Last Pain:  Vitals:   07/21/17 1440  TempSrc: Oral  PainSc: 0-No pain                 Nolon Nations

## 2017-07-21 NOTE — Interval H&P Note (Signed)
History and Physical Interval Note:  07/21/2017 11:59 AM  Haley Sanchez  has presented today for surgery, with the diagnosis of LEFT BREAST CANCER  The various methods of treatment have been discussed with the patient and family. After consideration of risks, benefits and other options for treatment, the patient has consented to  Procedure(s): BREAST LUMPECTOMY WITH RADIOACTIVE SEED AND SENTINEL LYMPH NODE BIOPSY (Left) as a surgical intervention .  The patient's history has been reviewed, patient examined, no change in status, stable for surgery.  I have reviewed the patient's chart and labs.  Questions were answered to the patient's satisfaction.     [name]

## 2017-07-21 NOTE — Anesthesia Procedure Notes (Signed)
Procedure Name: LMA Insertion Date/Time: 07/21/2017 12:38 PM Performed by: Toula Moos L Pre-anesthesia Checklist: Patient identified, Emergency Drugs available, Suction available, Patient being monitored and Timeout performed Patient Re-evaluated:Patient Re-evaluated prior to induction Oxygen Delivery Method: Circle system utilized Preoxygenation: Pre-oxygenation with 100% oxygen Induction Type: IV induction Ventilation: Mask ventilation without difficulty LMA: LMA inserted LMA Size: 4.0 Number of attempts: 1 Airway Equipment and Method: Bite block Placement Confirmation: positive ETCO2 Tube secured with: Tape Dental Injury: Teeth and Oropharynx as per pre-operative assessment

## 2017-07-22 ENCOUNTER — Encounter (HOSPITAL_BASED_OUTPATIENT_CLINIC_OR_DEPARTMENT_OTHER): Payer: Self-pay | Admitting: General Surgery

## 2017-07-25 ENCOUNTER — Encounter: Payer: Self-pay | Admitting: Women's Health

## 2017-07-26 ENCOUNTER — Ambulatory Visit: Payer: BC Managed Care – PPO

## 2017-07-26 ENCOUNTER — Ambulatory Visit (HOSPITAL_BASED_OUTPATIENT_CLINIC_OR_DEPARTMENT_OTHER): Payer: BC Managed Care – PPO | Admitting: Hematology and Oncology

## 2017-07-26 ENCOUNTER — Other Ambulatory Visit (HOSPITAL_BASED_OUTPATIENT_CLINIC_OR_DEPARTMENT_OTHER): Payer: BC Managed Care – PPO

## 2017-07-26 ENCOUNTER — Telehealth: Payer: Self-pay | Admitting: Hematology and Oncology

## 2017-07-26 ENCOUNTER — Ambulatory Visit (HOSPITAL_BASED_OUTPATIENT_CLINIC_OR_DEPARTMENT_OTHER): Payer: BC Managed Care – PPO

## 2017-07-26 DIAGNOSIS — Z170 Estrogen receptor positive status [ER+]: Principal | ICD-10-CM

## 2017-07-26 DIAGNOSIS — C50512 Malignant neoplasm of lower-outer quadrant of left female breast: Secondary | ICD-10-CM

## 2017-07-26 DIAGNOSIS — Z95828 Presence of other vascular implants and grafts: Secondary | ICD-10-CM

## 2017-07-26 DIAGNOSIS — G620 Drug-induced polyneuropathy: Secondary | ICD-10-CM | POA: Diagnosis not present

## 2017-07-26 DIAGNOSIS — Z5112 Encounter for antineoplastic immunotherapy: Secondary | ICD-10-CM

## 2017-07-26 LAB — COMPREHENSIVE METABOLIC PANEL
ALT: 55 U/L (ref 0–55)
AST: 40 U/L — AB (ref 5–34)
Albumin: 3.2 g/dL — ABNORMAL LOW (ref 3.5–5.0)
Alkaline Phosphatase: 106 U/L (ref 40–150)
Anion Gap: 9 mEq/L (ref 3–11)
BUN: 16.9 mg/dL (ref 7.0–26.0)
CALCIUM: 8.8 mg/dL (ref 8.4–10.4)
CHLORIDE: 106 meq/L (ref 98–109)
CO2: 27 meq/L (ref 22–29)
CREATININE: 1 mg/dL (ref 0.6–1.1)
EGFR: >60 mL/min/{1.73_m2} (ref >60)
GLUCOSE: 109 mg/dL (ref 70–140)
POTASSIUM: 4 meq/L (ref 3.5–5.1)
SODIUM: 141 meq/L (ref 136–145)
Total Bilirubin: 0.27 mg/dL (ref 0.20–1.20)
Total Protein: 6.1 g/dL — ABNORMAL LOW (ref 6.4–8.3)

## 2017-07-26 LAB — CBC WITH DIFFERENTIAL/PLATELET
BASO%: 0.8 % (ref 0.0–2.0)
BASOS ABS: 0 10*3/uL (ref 0.0–0.1)
EOS ABS: 0.6 10*3/uL — AB (ref 0.0–0.5)
EOS%: 13.4 % — AB (ref 0.0–7.0)
HEMATOCRIT: 35.8 % (ref 34.8–46.6)
HGB: 12.3 g/dL (ref 11.6–15.9)
LYMPH#: 1.4 10*3/uL (ref 0.9–3.3)
LYMPH%: 32.5 % (ref 14.0–49.7)
MCH: 31.4 pg (ref 25.1–34.0)
MCHC: 34.3 g/dL (ref 31.5–36.0)
MCV: 91.7 fL (ref 79.5–101.0)
MONO#: 0.3 10*3/uL (ref 0.1–0.9)
MONO%: 6.3 % (ref 0.0–14.0)
NEUT#: 2 10*3/uL (ref 1.5–6.5)
NEUT%: 47 % (ref 38.4–76.8)
PLATELETS: 209 10*3/uL (ref 145–400)
RBC: 3.9 10*6/uL (ref 3.70–5.45)
RDW: 13 % (ref 11.2–14.5)
WBC: 4.2 10*3/uL (ref 3.9–10.3)

## 2017-07-26 MED ORDER — HEPARIN SOD (PORK) LOCK FLUSH 100 UNIT/ML IV SOLN
500.0000 [IU] | Freq: Once | INTRAVENOUS | Status: AC | PRN
  Administered 2017-07-26: 500 [IU]
  Filled 2017-07-26: qty 5

## 2017-07-26 MED ORDER — DIPHENHYDRAMINE HCL 25 MG PO CAPS
ORAL_CAPSULE | ORAL | Status: AC
  Filled 2017-07-26: qty 2

## 2017-07-26 MED ORDER — TRASTUZUMAB CHEMO 150 MG IV SOLR
6.0000 mg/kg | Freq: Once | INTRAVENOUS | Status: AC
  Administered 2017-07-26: 504 mg via INTRAVENOUS
  Filled 2017-07-26: qty 24

## 2017-07-26 MED ORDER — DIPHENHYDRAMINE HCL 25 MG PO CAPS
50.0000 mg | ORAL_CAPSULE | Freq: Once | ORAL | Status: AC
  Administered 2017-07-26: 50 mg via ORAL

## 2017-07-26 MED ORDER — ACETAMINOPHEN 325 MG PO TABS
650.0000 mg | ORAL_TABLET | Freq: Once | ORAL | Status: AC
  Administered 2017-07-26: 650 mg via ORAL

## 2017-07-26 MED ORDER — ACETAMINOPHEN 325 MG PO TABS
ORAL_TABLET | ORAL | Status: AC
  Filled 2017-07-26: qty 2

## 2017-07-26 MED ORDER — SODIUM CHLORIDE 0.9% FLUSH
10.0000 mL | INTRAVENOUS | Status: DC | PRN
  Administered 2017-07-26: 10 mL
  Filled 2017-07-26: qty 10

## 2017-07-26 MED ORDER — SODIUM CHLORIDE 0.9% FLUSH
10.0000 mL | Freq: Once | INTRAVENOUS | Status: AC
  Administered 2017-07-26: 10 mL
  Filled 2017-07-26: qty 10

## 2017-07-26 MED ORDER — SODIUM CHLORIDE 0.9 % IV SOLN
Freq: Once | INTRAVENOUS | Status: AC
  Administered 2017-07-26: 10:00:00 via INTRAVENOUS

## 2017-07-26 NOTE — Patient Instructions (Signed)
[facility] Discharge Instructions for Patients Receiving Antibody  Today you received the following: Herceptin  To help prevent nausea and vomiting after your treatment, we encourage you to take your nausea medication as directed.  If you develop nausea and vomiting that is not controlled by your nausea medication, call the clinic.   BELOW ARE SYMPTOMS THAT SHOULD BE REPORTED IMMEDIATELY:  *FEVER GREATER THAN 100.5 F  *CHILLS WITH OR WITHOUT FEVER  NAUSEA AND VOMITING THAT IS NOT CONTROLLED WITH YOUR NAUSEA MEDICATION  *UNUSUAL SHORTNESS OF BREATH  *UNUSUAL BRUISING OR BLEEDING  TENDERNESS IN MOUTH AND THROAT WITH OR WITHOUT PRESENCE OF ULCERS  *URINARY PROBLEMS  *BOWEL PROBLEMS  UNUSUAL RASH Items with * indicate a potential emergency and should be followed up as soon as possible.  Feel free to call the clinic should you have any questions or concerns. The clinic phone number is [phone].  Please show the [facility] at check-in to the Emergency Department and triage nurse.

## 2017-07-26 NOTE — Assessment & Plan Note (Signed)
Left breast 2:30 position: 2.1 cm irregular solid mass, additional 0.8 cm oval mass in the left breast (fibrocystic); primary mass on biopsy revealed IDC grade 3, ER 100%, PR 80%, Ki-67 25%, HER-2 positive ratio 1.88, copy #6.2, T2 N0 stage II a clinical stage  Treatment plan: 1. Neoadjuvant Taxol Herceptin weekly 11 completed 09/04/2018followed by Herceptin maintenance for 6 months 2. breast conserving surgery with sentinel lymph node biopsy done on 07/21/2017 07/21/2017 Left lumpectomy: Grade 2 IDC 1.9 cm with DCIS, margins negative, 0/4 lymph nodes negative, ER 100%, PR 80%, HER-2 positive, Ki-67 25%, RCB class II,T1c N0 stage IA  Pathology counseling: I discussed the final pathology report of the patient provided  a copy of this report. I discussed the margins as well as lymph node surgeries. We also discussed the final staging along with previously performed ER/PR and HER-2/neu testing.  Recommendation: 1. Continue adjuvant Herceptin 2. Adjuvant radiation therapy 3. Followed by adjuvant antiestrogen therapy  Chemotherapy-induced peripheral neuropathy  Return to clinic every 3 weeks for Herceptin every 6 weeks for follow-up with me

## 2017-07-26 NOTE — Telephone Encounter (Signed)
Scheduled appt per 10/16 los - left message with appt date and time.

## 2017-07-26 NOTE — Progress Notes (Signed)
Patient Care Team: Orpah Melter, MD as PCP - General (Family Medicine) Excell Seltzer, MD as Consulting Physician (General Surgery) Magrinat, Virgie Dad, MD as Consulting Physician (Oncology) Eppie Gibson, MD as Attending Physician (Radiation Oncology) Huel Cote, NP as Nurse Practitioner (Obstetrics and Gynecology)  DIAGNOSIS:  Encounter Diagnosis  Name Primary?  . Malignant neoplasm of lower-outer quadrant of left breast of female, estrogen receptor positive ([place])     SUMMARY OF ONCOLOGIC HISTORY:   Malignant neoplasm of lower-outer quadrant of left breast of female, estrogen receptor positive ([place])   03/01/2017 Initial Diagnosis    Left breast 2:30 position: 2.1 cm irregular solid mass, additional 0.8 cm oval mass in the left breast (fibrocystic); primary mass on biopsy revealed IDC grade 3, ER 100%, PR 80%, Ki-67 25%, HER-2 positive ratio 1.88, copy #6.2, T2 N0 stage II a clinical stage      03/29/2017 - 06/14/2017 Neo-Adjuvant Chemotherapy    Neoadjuvant chemotherapy with 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  The Multi-Gene Panel offered by [facility] includes sequencing and/or deletion duplication testing of the following 80 genes: ALK, APC, ATM, AXIN2,BAP1,  BARD1, BLM, BMPR1A, BRCA1, BRCA2, BRIP1, CASR, CDC73, CDH1, CDK4, CDKN1B, CDKN1C, CDKN2A (p14ARF), CDKN2A (p16INK4a), CEBPA, CHEK2, CTNNA1, DICER1, DIS3L2, EGFR (c.2369C>T, p.Thr790Met variant only), EPCAM (Deletion/duplication testing only), FH, FLCN, GATA2, GPC3, GREM1 (Promoter region deletion/duplication testing only), HOXB13 (c.251G>A, p.Gly84Glu), HRAS, KIT, MAX, MEN1, MET, MITF (c.952G>A, p.Glu318Lys variant only), MLH1, MSH2, MSH3, MSH6, MUTYH, NBN, NF1, NF2, NTHL1, PALB2, PDGFRA, PHOX2B, PMS2, POLD1, POLE, POT1, PRKAR1A, PTCH1, PTEN, RAD50, RAD51C, RAD51D, RB1, RECQL4, RET, RUNX1, SDHAF2, SDHA (sequence changes only),  SDHB, SDHC, SDHD, SMAD4, SMARCA4, SMARCB1, SMARCE1, STK11, SUFU, TERT, TERT, TMEM127, TP53, TSC1, TSC2, VHL, WRN and WT1.  The report date is April 27, 2017.       07/21/2017 Surgery    Left lumpectomy: Grade 2 IDC 1.9 cm with DCIS, margins negative, 0/4 lymph nodes negative, ER 100%, PR 80%, HER-2 positive, Ki-67 25%, RCB class II,T1c N0 stage IA       CHIEF COMPLIANT: follow-up of recent surgery, here for Herceptin maintenance  INTERVAL HISTORY: Haley Sanchez is a 56 year old with above-mentioned history of left breast cancer treated with neoadjuvant chemotherapy and recently underwent lumpectomy and is here today to discuss the pathology report. She is here today accompanied by her husband. She had a lot of numbness in the arm which has improved. She denies any nausea vomiting.  REVIEW OF SYSTEMS:   Constitutional: Denies fevers, chills or abnormal weight loss Eyes: Denies blurriness of vision Ears, nose, mouth, throat, and face: Denies mucositis or sore throat Respiratory: Denies cough, dyspnea or wheezes Cardiovascular: Denies palpitation, chest discomfort Gastrointestinal:  Denies nausea, heartburn or change in bowel habits Skin: Denies abnormal skin rashes Lymphatics: Denies new lymphadenopathy or easy bruising Neurological:Denies numbness, tingling or new weaknesses Behavioral/Psych: Mood is stable, no new changes  Extremities: No lower extremity edema Breast: recent lumpectomy and sentinel lymph node biopsy causing some tenderness All other systems were reviewed with the patient and are negative.  I have reviewed the past medical history, past surgical history, social history and family history with the patient and they are unchanged from previous note.  ALLERGIES:  is allergic to no known allergies.  MEDICATIONS:  Current Outpatient Prescriptions  Medication Sig Dispense Refill  . albuterol (PROVENTIL HFA;VENTOLIN HFA) 108 (90 Base) MCG/ACT  inhaler Inhale 2 puffs into the  lungs every 6 (six) hours as needed for wheezing or shortness of breath.    Marland Kitchen buPROPion (WELLBUTRIN XL) 300 MG 24 hr tablet Take 300 mg by mouth daily.    Marland Kitchen losartan (COZAAR) 100 MG tablet Take 100 mg by mouth daily.   0  . oxyCODONE (OXY IR/ROXICODONE) 5 MG immediate release tablet Take 1 tablet (5 mg total) by mouth every 6 (six) hours as needed for moderate pain, severe pain or breakthrough pain. 15 tablet 0  . PARoxetine (PAXIL) 40 MG tablet Take 40 mg by mouth every evening.     Marland Kitchen spironolactone (ALDACTONE) 25 MG tablet Take 0.5 tablets (12.5 mg total) by mouth daily. 90 tablet 3   No current facility-administered medications for this visit.     PHYSICAL EXAMINATION: ECOG PERFORMANCE STATUS: 1 - Symptomatic but completely ambulatory  Vitals:   07/26/17 0905  BP: (!) 144/74  Pulse: 71  Resp: 20  Temp: 98.6 F (37 C)  SpO2: 99%   Filed Weights   07/26/17 0905  Weight: 190 lb 12.8 oz (86.5 kg)    GENERAL:alert, no distress and comfortable SKIN: skin color, texture, turgor are normal, no rashes or significant lesions EYES: normal, Conjunctiva are pink and non-injected, sclera clear OROPHARYNX:no exudate, no erythema and lips, buccal mucosa, and tongue normal  NECK: supple, thyroid normal size, non-tender, without nodularity LYMPH:  no palpable lymphadenopathy in the cervical, axillary or inguinal LUNGS: clear to auscultation and percussion with normal breathing effort HEART: regular rate & rhythm and no murmurs and no lower extremity edema ABDOMEN:abdomen soft, non-tender and normal bowel sounds MUSCULOSKELETAL:no cyanosis of digits and no clubbing  NEURO: alert & oriented x 3 with fluent speech, no focal motor/sensory deficits EXTREMITIES: No lower extremity edema   LABORATORY DATA:  I have reviewed the data as listed   Chemistry      Component Value Date/Time   NA 141 07/15/2017 1511   K 4.3 07/15/2017 1511   CO2 29 07/15/2017 1511   BUN 12.5 07/15/2017 1511    CREATININE 1.0 07/15/2017 1511      Component Value Date/Time   CALCIUM 9.2 07/15/2017 1511   ALKPHOS 103 07/15/2017 1511   AST 19 07/15/2017 1511   ALT 21 07/15/2017 1511   BILITOT 0.30 07/15/2017 1511       Lab Results  Component Value Date   WBC 4.2 07/26/2017   HGB 12.3 07/26/2017   HCT 35.8 07/26/2017   MCV 91.7 07/26/2017   PLT 209 07/26/2017   NEUTROABS 2.0 07/26/2017    ASSESSMENT & PLAN:  Malignant neoplasm of lower-outer quadrant of left breast of female, estrogen receptor positive ([facility]) Left breast 2:30 position: 2.1 cm irregular solid mass, additional 0.8 cm oval mass in the left breast (fibrocystic); primary mass on biopsy revealed IDC grade 3, ER 100%, PR 80%, Ki-67 25%, HER-2 positive ratio 1.88, copy #6.2, T2 N0 stage II a clinical stage  Treatment plan: 1. Neoadjuvant Taxol Herceptin weekly 11 completed 09/04/2018followed by Herceptin maintenance for 6 months 2. breast conserving surgery with sentinel lymph node biopsy done on 07/21/2017 07/21/2017 Left lumpectomy: Grade 2 IDC 1.9 cm with DCIS, margins negative, 0/4 lymph nodes negative, ER 100%, PR 80%, HER-2 positive, Ki-67 25%, RCB class II,T1c N0 stage IA  Pathology counseling: I discussed the final pathology report of the patient provided  a copy of this report. I discussed the margins as well as lymph node surgeries. We also discussed  the final staging along with previously performed ER/PR and HER-2/neu testing.  Recommendation: 1. Continue adjuvant Herceptin 2. Adjuvant radiation therapy 3. Followed by adjuvant antiestrogen therapy  Chemotherapy-induced peripheral neuropathy  Return to clinic every 3 weeks for Herceptin every 6 weeks for follow-up with me    I spent 25 minutes talking to the patient of which more than half was spent in counseling and coordination of care.  No orders of the defined types were placed in this encounter.  The patient has a good understanding of the overall plan.  she agrees with it. she will call with any problems that may develop before the next visit here.   Rulon Eisenmenger, MD 07/26/17

## 2017-07-27 ENCOUNTER — Other Ambulatory Visit: Payer: Self-pay | Admitting: Oncology

## 2017-08-01 NOTE — Progress Notes (Signed)
Location of Breast Cancer: Left Breast  Histology per Pathology Report:  03/01/17 Diagnosis 1. Breast, left, needle core biopsy, (A) 3:00 o'clock palp mass 5 cm fn - INVASIVE DUCTAL CARCINOMA. - DUCTAL CARCINOMA IN SITU WITH CALCIFICATIONS. - SEE COMMENT. 2. Breast, left, needle core biopsy, (B) mass at 4:00 o'clock 1 cm fn - FIBROCYSTIC CHANGES. - THERE IS NO EVIDENCE OF MALIGNANCY.  Receptor Status: ER(100%), PR (80%), Her2-neu (POS), Ki-(25%)  07/21/17 Diagnosis 1. Breast, lumpectomy, Left w/seed INVASIVE DUCTAL CARCINOMA, POST NEO ADJUVANT THERAPY, SPANNING 1.9 CM DUCTAL CARCINOMA IN SITU IS PRESENT THE CARCINOMA IS FOCALLY WITHIN 1 MM FROM SUPERIOR, POSTERIOR AND INFERIOR MARGINS ALL MARGINS OF RESECTION ARE NEGATIVE FOR CARCINOMA 2. Lymph node, sentinel, biopsy, Left Axillary ONE BENIGN LYMPH NODE (0/1) 3. Lymph node, sentinel, biopsy, Left ONE BENIGN LYMPH NODE (0/1) 4. Lymph node, sentinel, biopsy, Left ONE BENIGN LYMPH NODE (0/1) 5. Lymph node, sentinel, biopsy, Left ONE BENIGN LYMPH NODE (0/1)  Receptor status: ER(95%), PR (80%), Her 2 (NEG).   Did patient present with symptoms or was this found on screening mammography?: She self palpated the mass.   Past/Anticipated interventions by surgeon, if any: 07/21/17 Procedure: Leftbreast seed guided lumpectomy Rightdeep axillary sentinel node biopsy Injection blue dye for sentinel node identification Surgeon: Dr Serita Grammes  Past/Anticipated interventions by medical oncology, if any:  07/26/17 Dr. Lindi Adie Treatment plan: 1. Neoadjuvant Taxol Herceptin weekly 11 completed 09/04/2018followed by Herceptin maintenance for 6 months 2. breast conserving surgery with sentinel lymph node biopsy done on 07/21/2017 07/21/2017 Left lumpectomy: Grade 2 IDC 1.9 cm with DCIS, margins negative, 0/4 lymph nodes negative, ER 100%, PR 80%, HER-2 positive, Ki-67 25%, RCB class II,T1c N0 stage IA  Recommendation: 1. Continue  adjuvant Herceptin 2. Adjuvant radiation therapy 3. Followed by adjuvant antiestrogen therapy  Chemotherapy-induced peripheral neuropathy  Return to clinic every 3 weeks for Herceptin every 6 weeks for follow-up with me   Lymphedema issues, if any:  She denies  Pain issues, if any:  She denies.   SAFETY ISSUES:  Prior radiation? No  Pacemaker/ICD? No  Possible current pregnancy? No  Is the patient on methotrexate? No  Current Complaints / other details:    BP (!) 144/86   Pulse 73   Temp 98.3 F (36.8 C)   Ht _0  (1.676 m)   Wt 191 lb (86.6 kg)   SpO2 97% Comment: room air  BMI 30.83 kg/m    Wt Readings from Last 3 Encounters:  08/05/17 191 lb (86.6 kg)  07/26/17 190 lb 12.8 oz (86.5 kg)  07/21/17 186 lb (84.4 kg)      Denine Brotz, Stephani Police, RN 08/01/2017,8:49 AM

## 2017-08-05 ENCOUNTER — Encounter: Payer: Self-pay | Admitting: Hematology and Oncology

## 2017-08-05 ENCOUNTER — Ambulatory Visit
Admission: RE | Admit: 2017-08-05 | Discharge: 2017-08-05 | Disposition: A | Discharge status: Home / Self Care | Payer: BC Managed Care – PPO | Source: Ambulatory Visit | Attending: Radiation Oncology | Admitting: Radiation Oncology

## 2017-08-05 ENCOUNTER — Encounter: Payer: Self-pay | Admitting: Radiation Oncology

## 2017-08-05 VITALS — BP 144/86 | HR 73 | Temp 98.3°F | Ht 66.0 in | Wt 191.0 lb

## 2017-08-05 DIAGNOSIS — C50512 Malignant neoplasm of lower-outer quadrant of left female breast: Secondary | ICD-10-CM | POA: Insufficient documentation

## 2017-08-05 DIAGNOSIS — I10 Essential (primary) hypertension: Secondary | ICD-10-CM | POA: Diagnosis not present

## 2017-08-05 DIAGNOSIS — Z170 Estrogen receptor positive status [ER+]: Secondary | ICD-10-CM | POA: Insufficient documentation

## 2017-08-05 DIAGNOSIS — J45909 Unspecified asthma, uncomplicated: Secondary | ICD-10-CM | POA: Insufficient documentation

## 2017-08-05 DIAGNOSIS — Z808 Family history of malignant neoplasm of other organs or systems: Secondary | ICD-10-CM | POA: Insufficient documentation

## 2017-08-05 DIAGNOSIS — Z803 Family history of malignant neoplasm of breast: Secondary | ICD-10-CM | POA: Insufficient documentation

## 2017-08-05 NOTE — Progress Notes (Signed)
Radiation Oncology         [phone] ________________________________  Name: Haley Sanchez MRN: [id number]  Date: 08/05/2017  DOB: June 29, 1961  Follow-Up Visit Note  Outpatient  CC: Orpah Melter, MD  Nicholas Lose, MD  Diagnosis:      ICD-10-CM   1. Malignant neoplasm of lower-outer quadrant of left breast of female, estrogen receptor positive ([place]) C50.512 Ambulatory referral to Social Work   Z17.0     Cancer Staging Malignant neoplasm of lower-outer quadrant of left breast of female, estrogen receptor positive ([place]) Staging form: Breast, AJCC 8th Edition - Clinical stage from 03/09/2017: Stage IB (cT2, cN0, cM0, G3, ER: Positive, PR: Positive, HER2: Positive) - Unsigned Staging comments: Staged at breast conference on 5.30.18 - Pathologic: No Stage Recommended (ypT1c, pN0, cM0, G2, ER: Positive, PR: Positive, HER2: Negative) - Signed by Gardenia Phlegm, NP on 08/03/2017   CHIEF COMPLAINT: Here to discuss management of left breast cancer.   Narrative:  The patient returns today for follow-up. Pt had an MRI of the bilateral breasts on 03/14/2017 revealing biopsy-proven carcinoma within the lower outer quadrant of the left breast, at posterior depth, measuring 2 cm, with associated biopsy clip (this mass was labeled 3 o'clock axis on ultrasound-guided biopsy images and pathology report). No evidence of multifocal or multicentric disease in the left breast. No evidence of malignancy in the right breast. Post biopsy change within the lower outer quadrant of the left breast, at anterior to middle depth, corresponding to the site of additional benign biopsy revealing fibrocystic change (labeled 4 o'clock axis on ultrasound-guided biopsy images and pathology report).  Patient has also had a negative genetic testing panel completed on 04/20/2017. She also had a lumpectomy of the left breast on 07/21/2017. This revealed a 1.9 cm tumor, invasive ductal carcinoma with DCIS. Also  revealing invasive ductal carcinoma within 1 mm of superior, posterior and inferior margins and all margins are negative for ductal carcinoma to DCIS and invasive carcinoma. Results also reveal all four sentinel nodes are negative. Her cancer grade II, ER/PR+, HER2 (-).  She also underwent new adjuvant chemotherapy with Taxol and Herceptin. 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   Pt presents to the office today accompanied by her husband. She reports that she is doing well overall. She and her husband are curious to know the side effects of the radiation which were explained and all questions answered. Explained that radiation therapy will begin right before Thanksgiving in about 1.5 weeks. 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 She is not a smoker. Pt does not menstruate and is menopausal.    On review of systems, pt reports some numbness to the triceps area of the LUE.             ALLERGIES:  is allergic to no known allergies.  Meds: Current Outpatient Prescriptions  Medication Sig Dispense Refill  . albuterol (PROVENTIL HFA;VENTOLIN HFA) 108 (90 Base) MCG/ACT inhaler Inhale 2 puffs into the lungs every 6 (six) hours as needed for wheezing or shortness of breath.    Marland Kitchen buPROPion (WELLBUTRIN XL) 300 MG 24 hr tablet Take 300 mg by mouth daily.    Marland Kitchen lidocaine-prilocaine (EMLA) cream     . losartan (COZAAR) 100 MG tablet Take 100 mg by mouth daily.   0  . ondansetron (ZOFRAN) 8 MG tablet Take by mouth.    Marland Kitchen PARoxetine (PAXIL) 40 MG  tablet Take 40 mg by mouth every evening.     . prochlorperazine (COMPAZINE) 10 MG tablet Take by mouth.    . spironolactone (ALDACTONE) 25 MG tablet Take 0.5 tablets (12.5 mg total) by mouth daily. 90 tablet 3  . oxyCODONE (OXY IR/ROXICODONE) 5 MG immediate release tablet Take 1 tablet (5 mg total) by mouth every 6 (six) hours as needed for moderate pain, severe pain or breakthrough pain. (Patient  not taking: Reported on 08/05/2017) 15 tablet 0   No current facility-administered medications for this encounter.     Physical Findings:  height is 5' 6"  (1.676 m) and weight is 191 lb (86.6 kg). Her temperature is 98.3 F (36.8 C). Her blood pressure is 144/86 (abnormal) and her pulse is 73. Her oxygen saturation is 97%. .     General: Alert and oriented, in no acute distress Neck: Neck is supple, no palpable cervical or supraclavicular lymphadenopathy. Heart: Regular in rate and rhythm with no murmurs, rubs, or gallops. Chest: Clear to auscultation bilaterally, with no rhonchi, wheezes, or rales. Extremities: No cyanosis or edema. Good ROM in left shoulder. Lymphatics: see Neck Exam Musculoskeletal: symmetric strength and muscle tone throughout. Psychiatric: Judgment and insight are intact. Affect is appropriate. Vascular: Port-a-cath in right upper chest. Breast exam reveals central and axillary scars are healing well over left breast. Left nipple is deviated laterally slightly.    Lab Findings: Lab Results  Component Value Date   WBC 4.2 07/26/2017   HGB 12.3 07/26/2017   HCT 35.8 07/26/2017   MCV 91.7 07/26/2017   PLT 209 07/26/2017    @LASTCHEMISTRY @  Radiographic Findings: No results found.  Impression/Plan: Left breast cancer  We discussed adjuvant radiotherapy today. I recommend a 4 week regimen of radiation therapy. The risks, benefits and side effects of this treatment were discussed in detail. She understands that radiotherapy is associated with skin irritation and fatigue in the acute setting. Late effects can include cosmetic changes and rare injury to internal organs. She is enthusiastic about proceeding with treatment. A consent form has been signed and placed in her chart.  A total of 3 medically necessary complex treatment devices will be fabricated and supervised by me: 2 fields with MLCs for custom blocks to protect heart, and lungs;  and, a Vac-lok. MORE  COMPLEX DEVICES MAY BE MADE IN DOSIMETRY FOR FIELD IN FIELD BEAMS FOR DOSE HOMOGENEITY.  I have requested : 3D Simulation which is medically necessary to give adequate dose to at risk tissues while sparing lungs and heart. I have requested a DVH of the following structures: lungs, heart, left lumpectomy cavity.    The patient will receive 40.05 Gy in 15 fractions to the left breast with 2 fields.  This will be followed by a boost.     Eppie Gibson, MD   This document serves as a record of services personally performed by Eppie Gibson, MD. It was created on her behalf by Marlowe Kays, a trained medical scribe. The creation of this record is based on the scribe's personal observations and the provider's statements to them. This document has been checked and approved by the attending provider.

## 2017-08-08 ENCOUNTER — Other Ambulatory Visit: Payer: Self-pay

## 2017-08-08 DIAGNOSIS — C50512 Malignant neoplasm of lower-outer quadrant of left female breast: Secondary | ICD-10-CM

## 2017-08-08 DIAGNOSIS — Z170 Estrogen receptor positive status [ER+]: Principal | ICD-10-CM

## 2017-08-15 ENCOUNTER — Encounter: Payer: Self-pay | Admitting: Hematology and Oncology

## 2017-08-16 ENCOUNTER — Ambulatory Visit (HOSPITAL_BASED_OUTPATIENT_CLINIC_OR_DEPARTMENT_OTHER): Payer: BC Managed Care – PPO

## 2017-08-16 ENCOUNTER — Ambulatory Visit: Payer: BC Managed Care – PPO

## 2017-08-16 ENCOUNTER — Other Ambulatory Visit (HOSPITAL_BASED_OUTPATIENT_CLINIC_OR_DEPARTMENT_OTHER): Payer: BC Managed Care – PPO

## 2017-08-16 VITALS — BP 149/78 | HR 75 | Temp 98.8°F | Resp 17

## 2017-08-16 DIAGNOSIS — C50512 Malignant neoplasm of lower-outer quadrant of left female breast: Secondary | ICD-10-CM

## 2017-08-16 DIAGNOSIS — Z5112 Encounter for antineoplastic immunotherapy: Secondary | ICD-10-CM

## 2017-08-16 DIAGNOSIS — Z95828 Presence of other vascular implants and grafts: Secondary | ICD-10-CM

## 2017-08-16 DIAGNOSIS — Z170 Estrogen receptor positive status [ER+]: Secondary | ICD-10-CM

## 2017-08-16 LAB — COMPREHENSIVE METABOLIC PANEL
ALT: 25 U/L (ref 0–55)
ANION GAP: 7 meq/L (ref 3–11)
AST: 21 U/L (ref 5–34)
Albumin: 3.7 g/dL (ref 3.5–5.0)
Alkaline Phosphatase: 100 U/L (ref 40–150)
BUN: 12.6 mg/dL (ref 7.0–26.0)
CHLORIDE: 108 meq/L (ref 98–109)
CO2: 28 meq/L (ref 22–29)
CREATININE: 1 mg/dL (ref 0.6–1.1)
Calcium: 8.9 mg/dL (ref 8.4–10.4)
EGFR: >60 mL/min/{1.73_m2} (ref >60)
Glucose: 87 mg/dl (ref 70–140)
POTASSIUM: 4.1 meq/L (ref 3.5–5.1)
Sodium: 142 mEq/L (ref 136–145)
Total Bilirubin: 0.24 mg/dL (ref 0.20–1.20)
Total Protein: 6.5 g/dL (ref 6.4–8.3)

## 2017-08-16 LAB — CBC WITH DIFFERENTIAL/PLATELET
BASO%: 0.6 % (ref 0.0–2.0)
Basophils Absolute: 0 10*3/uL (ref 0.0–0.1)
EOS%: 6.5 % (ref 0.0–7.0)
Eosinophils Absolute: 0.2 10*3/uL (ref 0.0–0.5)
HCT: 35.9 % (ref 34.8–46.6)
HGB: 12.1 g/dL (ref 11.6–15.9)
LYMPH%: 29.7 % (ref 14.0–49.7)
MCH: 30.7 pg (ref 25.1–34.0)
MCHC: 33.8 g/dL (ref 31.5–36.0)
MCV: 90.9 fL (ref 79.5–101.0)
MONO#: 0.3 10*3/uL (ref 0.1–0.9)
MONO%: 9 % (ref 0.0–14.0)
NEUT#: 1.9 10*3/uL (ref 1.5–6.5)
NEUT%: 54.2 % (ref 38.4–76.8)
Platelets: 220 10*3/uL (ref 145–400)
RBC: 3.95 10*6/uL (ref 3.70–5.45)
RDW: 12.8 % (ref 11.2–14.5)
WBC: 3.6 10*3/uL — ABNORMAL LOW (ref 3.9–10.3)
lymph#: 1.1 10*3/uL (ref 0.9–3.3)

## 2017-08-16 MED ORDER — SODIUM CHLORIDE 0.9% FLUSH
10.0000 mL | INTRAVENOUS | Status: DC | PRN
  Administered 2017-08-16: 10 mL
  Filled 2017-08-16: qty 10

## 2017-08-16 MED ORDER — DIPHENHYDRAMINE HCL 25 MG PO CAPS
ORAL_CAPSULE | ORAL | Status: AC
  Filled 2017-08-16: qty 2

## 2017-08-16 MED ORDER — TRASTUZUMAB CHEMO 150 MG IV SOLR
6.0000 mg/kg | Freq: Once | INTRAVENOUS | Status: AC
  Administered 2017-08-16: 504 mg via INTRAVENOUS
  Filled 2017-08-16: qty 24

## 2017-08-16 MED ORDER — ACETAMINOPHEN 325 MG PO TABS
650.0000 mg | ORAL_TABLET | Freq: Once | ORAL | Status: AC
  Administered 2017-08-16: 650 mg via ORAL

## 2017-08-16 MED ORDER — DIPHENHYDRAMINE HCL 25 MG PO CAPS
50.0000 mg | ORAL_CAPSULE | Freq: Once | ORAL | Status: AC
  Administered 2017-08-16: 50 mg via ORAL

## 2017-08-16 MED ORDER — SODIUM CHLORIDE 0.9% FLUSH
10.0000 mL | Freq: Once | INTRAVENOUS | Status: AC
  Administered 2017-08-16: 10 mL
  Filled 2017-08-16: qty 10

## 2017-08-16 MED ORDER — SODIUM CHLORIDE 0.9 % IV SOLN
Freq: Once | INTRAVENOUS | Status: AC
  Administered 2017-08-16: 12:00:00 via INTRAVENOUS

## 2017-08-16 MED ORDER — HEPARIN SOD (PORK) LOCK FLUSH 100 UNIT/ML IV SOLN
500.0000 [IU] | Freq: Once | INTRAVENOUS | Status: AC | PRN
  Administered 2017-08-16: 500 [IU]
  Filled 2017-08-16: qty 5

## 2017-08-16 MED ORDER — ACETAMINOPHEN 325 MG PO TABS
ORAL_TABLET | ORAL | Status: AC
  Filled 2017-08-16: qty 2

## 2017-08-16 NOTE — Patient Instructions (Signed)
[facility] Discharge Instructions for Patients Receiving Chemotherapy  Today you received the following chemotherapy agents: Trastuzumab (Herceptin).  To help prevent nausea and vomiting after your treatment, we encourage you to take your nausea medication as prescribed. If you develop nausea and vomiting that is not controlled by your nausea medication, call the clinic.   BELOW ARE SYMPTOMS THAT SHOULD BE REPORTED IMMEDIATELY:  *FEVER GREATER THAN 100.5 F  *CHILLS WITH OR WITHOUT FEVER  NAUSEA AND VOMITING THAT IS NOT CONTROLLED WITH YOUR NAUSEA MEDICATION  *UNUSUAL SHORTNESS OF BREATH  *UNUSUAL BRUISING OR BLEEDING  TENDERNESS IN MOUTH AND THROAT WITH OR WITHOUT PRESENCE OF ULCERS  *URINARY PROBLEMS  *BOWEL PROBLEMS  UNUSUAL RASH Items with * indicate a potential emergency and should be followed up as soon as possible.  Feel free to call the clinic should you have any questions or concerns. The clinic phone number is [phone].  Please show the CHEMO ALERT CARD at check-in to the Emergency Department and triage nurse.   

## 2017-08-18 ENCOUNTER — Ambulatory Visit: Payer: BC Managed Care – PPO | Attending: Hematology and Oncology | Admitting: Physical Therapy

## 2017-08-18 ENCOUNTER — Other Ambulatory Visit: Payer: Self-pay

## 2017-08-18 DIAGNOSIS — M6281 Muscle weakness (generalized): Secondary | ICD-10-CM | POA: Insufficient documentation

## 2017-08-18 DIAGNOSIS — R293 Abnormal posture: Secondary | ICD-10-CM | POA: Insufficient documentation

## 2017-08-18 DIAGNOSIS — M25612 Stiffness of left shoulder, not elsewhere classified: Secondary | ICD-10-CM | POA: Insufficient documentation

## 2017-08-18 DIAGNOSIS — Z483 Aftercare following surgery for neoplasm: Secondary | ICD-10-CM | POA: Diagnosis not present

## 2017-08-18 NOTE — Therapy (Signed)
[place], [place], [address] Phone: [phone]   Fax:  [phone]  Physical Therapy Evaluation  Patient Details  Name: Haley Sanchez MRN: [id number] Date of Birth: 05/29/1961 Referring Provider: Dr. Lindi Adie    Encounter Date: 08/18/2017  PT End of Session - 08/18/17 1229    Visit Number  1    Number of Visits  17    Date for PT Re-Evaluation  10/13/17    PT Start Time  1025    PT Stop Time  1105    PT Time Calculation (min)  40 min    Activity Tolerance  Patient tolerated treatment well    Behavior During Therapy  [facility] for tasks assessed/performed       Past Medical History:  Diagnosis Date  . Anxiety   . Asthma   . Cancer [facility])    breast cancer  . Complication of anesthesia   . Depression   . Family history of breast cancer   . Family history of prostate cancer   . Hypertension   . PONV (postoperative nausea and vomiting)   . Seasonal allergies     No past surgical history on file.  There were no vitals filed for this visit.   Subjective Assessment - 08/18/17 1032    Subjective  "I feel like a bird with a broken wing"     Pertinent History  left breast cancer diagnosed 03/01/2017, chemotherapy 6/19-06/14/2017, stopped early due to neuopathy symptoms in fingers and toes  that has been resolving, Left lumpectomy ( Dr. Donne Hazel) on 07/21/2017 with 4 nodes removed.  Left breast developed cellulitis and she is currently on antibiotics, CT simulation for radiation scheduled for 08/19/2017    Patient Stated Goals  i want to be able to have full mobility.  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  Yes    Pain Score  3     Pain Location  Breast    Pain Orientation  Left    Pain Descriptors / Indicators  Aching    Pain Type  Acute pain    Pain Radiating Towards  no    Pain Onset  In the past 7 days    Pain Frequency   Intermittent    Aggravating Factors   laying on it     Pain Relieving Factors  none    Effect of Pain on Daily Activities  limits driving          [facility] PT Assessment - 08/18/17 0001      Assessment   Medical Diagnosis  breast cancer     Referring Provider  Dr. Lindi Adie     Onset Date/Surgical Date  03/01/17    Hand Dominance  Right      Precautions   Precaution Comments  current cellulitis of left breast       Restrictions   Weight Bearing Restrictions  No      Balance Screen   Has the patient fallen in the past 6 months  No    Has the patient had a decrease in activity level because of a fear of falling?   Yes pt is careful, will not be adressed this episode    pt is careful, will not be adressed this episode    Is the patient reluctant to leave their home because of a fear of falling?  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, lifting, walking     Leisure  not a regular exercise, likes to walk in the woods, has grandchildren one a week, 4 months to 10 yr( 5 grandkids)       Cognition   Overall Cognitive Status  Within Functional Limits for tasks assessed      Observation/Other Assessments   Observations  pinkness of left breat wtih steri strips intact over healing incisions at axilla arnd around areola     Quick DASH   31.82      Sensation   Light Touch  Impaired Detail    Light Touch Impaired Details  Impaired LUE numbness in left upper arm    numbness in left upper arm      Coordination   Gross Motor Movements are Fluid and Coordinated  No limited by pain and protective posturing of left arm    limited by pain and  protective posturing of left arm      Posture/Postural Control   Posture/Postural Control  Postural limitations    Postural Limitations  Rounded Shoulders;Forward head;Decreased thoracic kyphosis      AROM   Overall AROM Comments  pt puts right hand on left axilla to support with active elevation of left arm     Right Shoulder Extension  40 Degrees    Right Shoulder Flexion  165 Degrees    Right Shoulder ABduction  170 Degrees    Right Shoulder Internal Rotation  45 Degrees    Right Shoulder External Rotation  90 Degrees    Left Shoulder Extension  40 Degrees    Left Shoulder Flexion  125 Degrees    Left Shoulder ABduction  95 Degrees    Left Shoulder Internal Rotation  40 Degrees    Left Shoulder External Rotation  90 Degrees      Strength   Overall Strength Comments  left arm grossly 3-/5 due to pain       Palpation   Palpation comment  palpable fullness in left upper chest , axilla and lateral chest         LYMPHEDEMA/ONCOLOGY QUESTIONNAIRE - 08/18/17 1049      Type   Cancer Type  breast      Treatment   Past Chemotherapy Treatment  Yes    Active Radiation Treatment  -- starting radiation soon    starting radiation soon    Current Hormone Treatment  Yes      What other symptoms do you have   Are you Having Heaviness or Tightness  No    Are you having Pain  No    Are you having pitting edema  No    Is it Hard or Difficult finding clothes that fit  No    Do you have infections  Yes    Stemmer Sign  No      Right Upper Extremity Lymphedema   10 cm Proximal to Olecranon Process  33 cm    Olecranon Process  26 cm    15 cm Proximal to Ulnar  Styloid Process  25 cm    Just Proximal to Ulnar Styloid Process  15.5 cm    Across Hand at [employer]  19 cm    At [place] of 2nd Digit  5.9 cm      Left Upper Extremity Lymphedema   10 cm Proximal to Olecranon Process  33 cm    Olecranon Process  26 cm    15 cm Proximal to Ulnar Styloid Process  24 cm    Just Proximal to  Ulnar Styloid Process  15.5 cm    Across Hand at [employer]  19 cm    At [place] of 2nd Digit  5.9 cm        Quick Dash - 08/18/17 0001    Open a tight or new jar  Moderate difficulty    Do heavy household chores (wash walls, wash floors)  Moderate difficulty    Carry a shopping bag or briefcase  Mild difficulty    Wash your back  Mild difficulty    Use a knife to cut food  No difficulty    Recreational activities in which you take some force or impact through your arm, shoulder, or hand (golf, hammering, tennis)  Severe difficulty    During the past week, to what extent has your arm, shoulder or hand problem interfered with your normal social activities with family, friends, neighbors, or groups?  Modererately    During the past week, to what extent has your arm, shoulder or hand problem limited your work or other regular daily activities  Modererately    Arm, shoulder, or hand pain.  None    Tingling (pins and needles) in your arm, shoulder, or hand  None    Difficulty Sleeping  Mild difficulty    DASH Score  31.82 %       Objective measurements completed on examination: See above findings.      [facility] Adult PT Treatment/Exercise - 08/18/17 0001      Self-Care   Self-Care  Other Self-Care Comments    Other Self-Care Comments   supine dowel rod and hand behind head with elbow on pillow to prepare for radiation              PT Education - 08/18/17 1228    Education provided  Yes    Education Details  supine dowel rod and stretching with hand behind head and elbow on pillow to prepare for radiation     Person(s) Educated  Patient    Methods  Explanation;Demonstration;Handout    Comprehension  Verbalized understanding;Returned demonstration        Short Term Clinic Goals - 08/18/17 1239      CC Short Term Goal  #1   Title  Patient with verbalize an understanding of lymphedema risk reduction precautions    Time  4    Period  Weeks    Status  New      CC Short  Term Goal  #2   Title  Patient will improve left shoulder abduction to 120 degrees so that she can achieve position needed for radiation therapy.    Time  4    Period  Weeks    Status  New      CC Short Term Goal  #3   Title  Pt will be independent in home exercise program for left shoulder range of motion     Time  4    Period  Weeks    Status  New          Long Term Clinic Goals - 08/18/17 1238      CC Long Term Goal  #1   Title  Pt will have decrease in quick DASH score to < 10 indicating fucntional improvment in lef UE    Baseline  31.82    Time  8      CC Long Term Goal  #2   Title  Pt will be independent in home program for upper body strength to decrease lymphedema risk and prepare for return to work     Time  8    Period  Weeks    Status  New      CC Long Term Goal  #3   Title  Pt will have 150 degrees of left shoulder abduction  so that she can perform overhead activities with ease     Baseline  79    Time  8    Period  Weeks    Status  New          Plan - 08/18/17 1230    Clinical Impression Statement  Pt 4 weeks post left lumpectomy currently with left breast cellulitis who will be starting radiation soon with decreased left shoulder range of motion and strenth. She has no knowledge of lymphedema risk reduction practices     History and Personal Factors relevant to plan of care:  chemotherapy stopped due to neuropathy., 4 lymph nodes removed.  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     Clinical Presentation  Evolving    Clinical Presentation due to:  will have radiation     Clinical Decision Making  Moderate    Rehab Potential  Good    Clinical Impairments Affecting Rehab Potential  as above in personal factors     PT Frequency  2x / week    PT Duration  8 weeks may be on hold some time during radiation    may be on hold some time during radiation    PT Treatment/Interventions   ADLs/Self Care Home Management;Neuromuscular re-education;Scar mobilization;Passive range of motion;Patient/family education;DME Instruction;Therapeutic activities;Therapeutic exercise;Manual techniques;Taping    PT Next Visit Plan  check to see if signed up for ABC class ( flyer given today) begin A/AA/PROM, pulley, ball on wall, wall wash and stretches. monitor cellulits healing and use manual tecniques as needed for pain managment. later teach Strength ABC program in preparation for return to work , assist with prophylactic sleeve as needed     Recommended Other Services  ABC class    Consulted and Agree with Plan of Care  Patient       Patient will benefit from skilled therapeutic intervention in order to improve the following deficits and impairments:  Decreased skin integrity, Increased edema, Decreased scar mobility, Decreased knowledge of precautions, Decreased activity tolerance, Decreased strength, Impaired UE functional use, Pain, Increased muscle spasms, Decreased range of motion, Postural dysfunction  Visit Diagnosis: Aftercare following surgery for neoplasm - Plan: PT plan of care cert/re-cert  Abnormal posture - Plan: PT plan of care cert/re-cert  Stiffness of left shoulder joint - Plan: PT plan of care cert/re-cert  Muscle weakness (generalized) - Plan: PT plan of care cert/re-cert     Problem List Patient Active Problem List   Diagnosis Date Noted  . Cystitis 05/06/2017  . Hematuria 05/05/2017  . Genetic testing 04/29/2017  . Family history of breast cancer   .  Family history of prostate cancer   . Port catheter in place 04/05/2017  . Malignant neoplasm of lower-outer quadrant of left breast of female, estrogen receptor positive ([place]) 03/03/2017   Donato Heinz. Owens Shark PT   Norwood Levo 08/18/2017, 12:48 PM  [place], [place], [address] Phone: [phone]   Fax:   [phone]  Name: CONOR FILSAIME MRN: [id number] Date of Birth: 05-Feb-1961

## 2017-08-18 NOTE — Patient Instructions (Signed)
SHOULDER: Flexion - Supine (Cane)        Cancer Rehab [phone]    Hold cane in both hands. Raise arms up overhead. Do not allow back to arch. Hold _5__ seconds. Do __5-10__ times; __1-2__ times a day.   SELF ASSISTED WITH OBJECT: Shoulder Abduction / Adduction - Supine    Hold cane with both hands. Move both arms from side to side, keep elbows straight.  Hold when stretch felt for __5__ seconds. Repeat __5-10__ times; __1-2__ times a day. Once this becomes easier progress to third picture bringing affected arm towards ear by staying out to side. Same hold for _5_seconds. Repeat  _5-10_ times, _1-2_ times/day.  Shoulder Blade Stretch    Clasp fingers behind head with elbows touching in front of face. Pull elbows back while pressing shoulder blades together. Relax and hold as tolerated, can place pillow under elbow here for comfort as needed and to allow for prolonged stretch.  Repeat __5__ times. Do __1-2__ sessions per day.     SHOULDER: External Rotation - Supine (Cane)    Hold cane with both hands. Rotate arm away from body. Keep elbow on floor and next to body. _5-10__ reps per set, hold 5 seconds, _1-2__ sets per day. Add towel to keep elbow at side.  Copyright  VHI. All rights reserved.      Wall wash

## 2017-08-19 ENCOUNTER — Ambulatory Visit
Admission: RE | Admit: 2017-08-19 | Discharge: 2017-08-19 | Disposition: A | Discharge status: Home / Self Care | Payer: BC Managed Care – PPO | Source: Ambulatory Visit | Attending: Radiation Oncology | Admitting: Radiation Oncology

## 2017-08-19 DIAGNOSIS — C50512 Malignant neoplasm of lower-outer quadrant of left female breast: Secondary | ICD-10-CM

## 2017-08-19 DIAGNOSIS — Z170 Estrogen receptor positive status [ER+]: Principal | ICD-10-CM

## 2017-08-19 NOTE — Progress Notes (Signed)
Radiation Oncology         [phone] ________________________________  Name: Haley Sanchez MRN: [id number]  Date: 08/19/2017  DOB: [birth year]  SIMULATION AND TREATMENT PLANNING NOTE  / Special treatment procedure    Outpatient  DIAGNOSIS:     ICD-10-CM   1. Malignant neoplasm of lower-outer quadrant of left breast of female, estrogen receptor positive ([place]) C50.512    Z17.0     NARRATIVE:  The patient was brought to the [facility].  Identity was confirmed.  All relevant records and images related to the planned course of therapy were reviewed.  The patient freely provided informed written consent to proceed with treatment after reviewing the details related to the planned course of therapy. The consent form was witnessed and verified by the simulation staff.    Then, the patient was set-up in a stable reproducible supine position for radiation therapy with her ipsilateral arm over her head, and her upper body secured in a custom-made Vac-lok device.  CT images were obtained.  Surface markings were placed.  The CT images were loaded into the planning software.    Special treatment procedure:  Special treatment procedure was performed today due to the extra time and effort required by myself to plan and prepare this patient for deep inspiration breath hold technique.  I have determined cardiac sparing to be of benefit to this patient to prevent long term cardiac damage due to radiation of the heart.  Bellows were placed on the patient's abdomen. To facilitate cardiac sparing, the patient was coached by the radiation therapists on breath hold techniques and breathing practice was performed. Practice waveforms were obtained. The patient was then scanned while maintaining breath hold in the treatment position.  This image was then transferred over to the imaging specialist. The imaging specialist then created a fusion of the free breathing and breath hold scans using the chest  wall as the stable structure. I personally reviewed the fusion in axial, coronal and sagittal image planes.  Excellent cardiac sparing was obtained.  I felt the patient is an appropriate candidate for breath hold and the patient will be treated as such.  The image fusion was then reviewed with the patient to reinforce the necessity of reproducible breath hold.  TREATMENT PLANNING NOTE: Treatment planning then occurred.  The radiation prescription was entered and confirmed.     A total of 3 medically necessary complex treatment devices were fabricated and supervised by me: 2 fields with MLCs for custom blocks to protect heart, and lungs;  and, a Vac-lok. MORE COMPLEX DEVICES MAY BE MADE IN DOSIMETRY FOR FIELD IN FIELD BEAMS FOR DOSE HOMOGENEITY.  I have requested : 3D Simulation which is medically necessary to give adequate dose to at risk tissues while sparing lungs and heart.  I have requested a DVH of the following structures: lungs, heart, left lumpectomy cavity.    The patient will receive 40.05 Gy in 15 fractions to the left breast with 2 tangential fields.  This will be followed by a boost.  Optical Surface Tracking Plan:  Since intensity modulated radiotherapy (IMRT) and 3D conformal radiation treatment methods are predicated on accurate and precise positioning for treatment, intrafraction motion monitoring is medically necessary to ensure accurate and safe treatment delivery. The ability to quantify intrafraction motion without excessive ionizing radiation dose can only be performed with optical surface tracking. Accordingly, surface imaging offers the opportunity to obtain 3D measurements of patient position throughout IMRT and 3D treatments  without excessive radiation exposure. I am ordering optical surface tracking for this patient's upcoming course of radiotherapy.  ________________________________   Reference:  Ursula Alert, J, et al. Surface imaging-based analysis of  intrafraction motion for breast radiotherapy patients.Journal of [place], n. 6, nov. 2014. ISSN [id number].  Available at: <http://www.jacmp.org/index.php/jacmp/article/view/4957>.    -----------------------------------  Eppie Gibson, MD

## 2017-08-23 DIAGNOSIS — C50512 Malignant neoplasm of lower-outer quadrant of left female breast: Secondary | ICD-10-CM | POA: Diagnosis not present

## 2017-08-25 ENCOUNTER — Ambulatory Visit: Payer: BC Managed Care – PPO

## 2017-08-25 ENCOUNTER — Other Ambulatory Visit (HOSPITAL_COMMUNITY): Payer: Self-pay

## 2017-08-25 MED ORDER — SPIRONOLACTONE 25 MG PO TABS: 12.5000 mg | ORAL_TABLET | Freq: Every day | ORAL | 3 refills | Status: DC

## 2017-08-26 ENCOUNTER — Ambulatory Visit
Admission: RE | Admit: 2017-08-26 | Discharge: 2017-08-26 | Disposition: A | Discharge status: Home / Self Care | Payer: BC Managed Care – PPO | Source: Ambulatory Visit | Attending: Radiation Oncology | Admitting: Radiation Oncology

## 2017-08-26 DIAGNOSIS — C50512 Malignant neoplasm of lower-outer quadrant of left female breast: Secondary | ICD-10-CM | POA: Diagnosis not present

## 2017-08-28 ENCOUNTER — Encounter: Payer: Self-pay | Admitting: Hematology and Oncology

## 2017-08-29 ENCOUNTER — Ambulatory Visit
Admission: RE | Admit: 2017-08-29 | Discharge: 2017-08-29 | Disposition: A | Discharge status: Home / Self Care | Payer: BC Managed Care – PPO | Source: Ambulatory Visit | Attending: Radiation Oncology | Admitting: Radiation Oncology

## 2017-08-29 ENCOUNTER — Ambulatory Visit: Payer: BC Managed Care – PPO

## 2017-08-29 DIAGNOSIS — C50512 Malignant neoplasm of lower-outer quadrant of left female breast: Secondary | ICD-10-CM | POA: Diagnosis not present

## 2017-08-30 ENCOUNTER — Ambulatory Visit
Admission: RE | Admit: 2017-08-30 | Discharge: 2017-08-30 | Disposition: A | Discharge status: Home / Self Care | Payer: BC Managed Care – PPO | Source: Ambulatory Visit | Attending: Radiation Oncology | Admitting: Radiation Oncology

## 2017-08-30 DIAGNOSIS — C50512 Malignant neoplasm of lower-outer quadrant of left female breast: Secondary | ICD-10-CM | POA: Diagnosis not present

## 2017-08-31 ENCOUNTER — Telehealth: Payer: Self-pay

## 2017-08-31 ENCOUNTER — Ambulatory Visit
Admission: RE | Admit: 2017-08-31 | Discharge: 2017-08-31 | Disposition: A | Discharge status: Home / Self Care | Payer: BC Managed Care – PPO | Source: Ambulatory Visit | Attending: Radiation Oncology | Admitting: Radiation Oncology

## 2017-08-31 ENCOUNTER — Other Ambulatory Visit: Payer: Self-pay

## 2017-08-31 DIAGNOSIS — C50512 Malignant neoplasm of lower-outer quadrant of left female breast: Secondary | ICD-10-CM | POA: Diagnosis not present

## 2017-08-31 DIAGNOSIS — Z170 Estrogen receptor positive status [ER+]: Principal | ICD-10-CM

## 2017-08-31 MED ORDER — RADIAPLEXRX EX GEL
Freq: Once | CUTANEOUS | Status: AC
  Administered 2017-08-31: 08:00:00 via TOPICAL

## 2017-08-31 MED ORDER — ALRA NON-METALLIC DEODORANT (RAD-ONC)
1.0000 "application " | Freq: Once | TOPICAL | Status: AC
  Administered 2017-08-31: 1 via TOPICAL

## 2017-08-31 MED ORDER — TRIAMCINOLONE ACETONIDE 0.5 % EX OINT: 1.0000 "application " | TOPICAL_OINTMENT | Freq: Two times a day (BID) | CUTANEOUS | 0 refills | Status: DC

## 2017-08-31 NOTE — Progress Notes (Signed)

## 2017-08-31 NOTE — Telephone Encounter (Signed)
Per Dr. Lindi Adie sent in a script for [facility] for pts rash.  Cyndia Bent RN

## 2017-09-04 ENCOUNTER — Encounter: Payer: Self-pay | Admitting: Hematology and Oncology

## 2017-09-05 ENCOUNTER — Encounter: Payer: Self-pay | Admitting: Hematology and Oncology

## 2017-09-05 ENCOUNTER — Encounter: Payer: BC Managed Care – PPO | Admitting: Physical Therapy

## 2017-09-05 ENCOUNTER — Telehealth: Payer: Self-pay

## 2017-09-05 ENCOUNTER — Ambulatory Visit: Payer: BC Managed Care – PPO

## 2017-09-05 NOTE — Telephone Encounter (Signed)
Pt called that she is sick. Coughing for last 2 days. Cancelled xrt today. Unable to sleep d/t coughing. Has asthma. Non productive. No fever. Headache, sore throat, wore herself out from coughing. Has used delsym, mucinex, antihistamines, salt water gargle, apple cider vinegar gargle, steamy shower.   She usually gets z-pack and hydrocodone cough medicine. Her prior MD retired.  She does not feel adequate to drive to [place]. She is going to call [facility] and if cannot get in there will go to [facility].  She asked that tomorrow's appts be cancelled. Lab flush, [place], infusion, and radiation treatment. Med onc cancelled. Message forwarded to rad onc.

## 2017-09-06 ENCOUNTER — Telehealth: Payer: Self-pay

## 2017-09-06 ENCOUNTER — Other Ambulatory Visit: Payer: BC Managed Care – PPO

## 2017-09-06 ENCOUNTER — Ambulatory Visit: Payer: BC Managed Care – PPO

## 2017-09-06 ENCOUNTER — Ambulatory Visit: Payer: BC Managed Care – PPO | Admitting: Adult Health

## 2017-09-06 NOTE — Telephone Encounter (Signed)
Ms. Pingley daughter-in-law called me back to let me know that [name] is unable to talk, and has a constant cough at this time. She did see her primary care doctor yesterday and hopes that she is feeling better for radiation tomorrow. They will contact us if she is unable to come. I again let [name]'s daugher-in-law know how important it is to have radiation as originally planned to provide her the best benefit. She voiced her understanding and knows to call me if she has any further questions or concerns.

## 2017-09-06 NOTE — Telephone Encounter (Signed)
I called Haley Sanchez to inquire about her cancelled radiation appointments yesterday and today due to illness. I left a voice mail asking about her illness and emphasized the importance of coming for her regular radiation appointments to help her achieve the best possilbe outcome from radiation. I told her that we would be happy to schedule her an appointment with symptom management in the medical oncology area if needed. I left my direct contact number and asked her to call me back as soon as possible.

## 2017-09-07 ENCOUNTER — Encounter: Payer: BC Managed Care – PPO | Admitting: Physical Therapy

## 2017-09-07 ENCOUNTER — Ambulatory Visit
Admission: RE | Admit: 2017-09-07 | Discharge: 2017-09-07 | Disposition: A | Discharge status: Home / Self Care | Payer: BC Managed Care – PPO | Source: Ambulatory Visit | Attending: Radiation Oncology | Admitting: Radiation Oncology

## 2017-09-07 ENCOUNTER — Encounter: Payer: Self-pay | Admitting: Hematology and Oncology

## 2017-09-07 DIAGNOSIS — C50512 Malignant neoplasm of lower-outer quadrant of left female breast: Secondary | ICD-10-CM | POA: Insufficient documentation

## 2017-09-07 DIAGNOSIS — Z510 Encounter for antineoplastic radiation therapy: Secondary | ICD-10-CM | POA: Diagnosis present

## 2017-09-07 DIAGNOSIS — Z79899 Other long term (current) drug therapy: Secondary | ICD-10-CM | POA: Insufficient documentation

## 2017-09-07 DIAGNOSIS — Z8042 Family history of malignant neoplasm of prostate: Secondary | ICD-10-CM | POA: Insufficient documentation

## 2017-09-07 DIAGNOSIS — I10 Essential (primary) hypertension: Secondary | ICD-10-CM | POA: Diagnosis not present

## 2017-09-07 DIAGNOSIS — Z803 Family history of malignant neoplasm of breast: Secondary | ICD-10-CM | POA: Diagnosis not present

## 2017-09-07 DIAGNOSIS — Z79891 Long term (current) use of opiate analgesic: Secondary | ICD-10-CM | POA: Insufficient documentation

## 2017-09-07 DIAGNOSIS — Z170 Estrogen receptor positive status [ER+]: Secondary | ICD-10-CM | POA: Insufficient documentation

## 2017-09-08 ENCOUNTER — Encounter: Payer: Self-pay | Admitting: General Practice

## 2017-09-08 ENCOUNTER — Other Ambulatory Visit: Payer: Self-pay

## 2017-09-08 ENCOUNTER — Ambulatory Visit
Admission: RE | Admit: 2017-09-08 | Discharge: 2017-09-08 | Disposition: A | Discharge status: Home / Self Care | Payer: BC Managed Care – PPO | Source: Ambulatory Visit | Attending: Radiation Oncology | Admitting: Radiation Oncology

## 2017-09-08 ENCOUNTER — Telehealth: Payer: Self-pay | Admitting: Hematology and Oncology

## 2017-09-08 DIAGNOSIS — C50512 Malignant neoplasm of lower-outer quadrant of left female breast: Secondary | ICD-10-CM

## 2017-09-08 DIAGNOSIS — Z8042 Family history of malignant neoplasm of prostate: Secondary | ICD-10-CM

## 2017-09-08 DIAGNOSIS — Z510 Encounter for antineoplastic radiation therapy: Secondary | ICD-10-CM | POA: Diagnosis not present

## 2017-09-08 DIAGNOSIS — Z170 Estrogen receptor positive status [ER+]: Principal | ICD-10-CM

## 2017-09-08 NOTE — Telephone Encounter (Signed)
Called patient regarding appts - wanted her treatment to be moved to Friday. Send email to [place].

## 2017-09-08 NOTE — Progress Notes (Signed)
[facility] Psychosocial Distress Screening Clinical Social Work  Clinical Social Work was referred by distress screening protocol.  The patient scored a 9 initially and then a 5  on the Psychosocial Distress Thermometer which indicates severe then moderate distress. Clinical Social Worker Edwyna Shell  to assess for distress and other psychosocial needs. CSW and patient discussed common feeling and emotions when being diagnosed with cancer, and the importance of support during treatment. CSW informed patient of the support team and support services at [facility]. CSW provided contact information and encouraged patient to call with any questions or concerns.  Patient reports most significant challenge is changing identity post diagnosis and treatment - struggling to redefine self in context of cancer, treatment, fatigue.  Concerned about returning to work and being too fatigued to work as Pharmacist, hospital.  Limited support from spouse in the home, children are young adults living on their own.  Has practical support from mother, co workers; however, has been unable to find emotional support and connection within her own network.  Encouraged to access resources in [employer] and connect w CSW when in Radiation treatment, patient agreeable.  CSW will follow as patient contacts and requests.       ONCBCN DISTRESS SCREENING 08/05/2017  Screening Type Initial Screening  Distress experienced in past week (1-10) 5  Practical problem type   Family Problem type   Emotional problem type Depression;Adjusting to illness;Adjusting to appearance changes  Information Concerns Type   Physical Problem type   Referral to support programs    ONCBCN DISTRESS SCREENING 03/09/2017  Screening Type Initial Screening  Distress experienced in past week (1-10) 9  Practical problem type Work/school  Family Problem type Partner  Emotional problem type Depression;Nervousness/Anxiety  Information Concerns Type Lack of info about  diagnosis;Lack of info about treatment;Lack of info about complementary therapy choices  Physical Problem type Skin dry/itchy  Referral to support programs Yes     Clinical Social Worker follow up needed: Yes.    If yes, follow up plan:  Call next week to determine if needs are met, offered individual meeting w patient.     Edwyna Shell, LCSW Clinical Social Worker Phone:  [phone]

## 2017-09-09 ENCOUNTER — Ambulatory Visit
Admission: RE | Admit: 2017-09-09 | Discharge: 2017-09-09 | Disposition: A | Discharge status: Home / Self Care | Payer: BC Managed Care – PPO | Source: Ambulatory Visit | Attending: Radiation Oncology | Admitting: Radiation Oncology

## 2017-09-09 ENCOUNTER — Encounter: Payer: BC Managed Care – PPO | Admitting: Physical Therapy

## 2017-09-09 ENCOUNTER — Other Ambulatory Visit: Payer: Self-pay

## 2017-09-09 ENCOUNTER — Telehealth: Payer: Self-pay | Admitting: Hematology and Oncology

## 2017-09-09 DIAGNOSIS — Z510 Encounter for antineoplastic radiation therapy: Secondary | ICD-10-CM | POA: Diagnosis not present

## 2017-09-09 DIAGNOSIS — E663 Overweight: Secondary | ICD-10-CM

## 2017-09-09 NOTE — Telephone Encounter (Signed)
Left message for patient regarding appt added per 11/27 - initially added on 12/6 and the patient requested for it to be added on 12/7. Added to 12/7 per the OK from [facility].

## 2017-09-12 ENCOUNTER — Ambulatory Visit
Admission: RE | Admit: 2017-09-12 | Discharge: 2017-09-12 | Disposition: A | Discharge status: Home / Self Care | Payer: BC Managed Care – PPO | Source: Ambulatory Visit | Attending: Radiation Oncology | Admitting: Radiation Oncology

## 2017-09-12 DIAGNOSIS — Z510 Encounter for antineoplastic radiation therapy: Secondary | ICD-10-CM | POA: Diagnosis not present

## 2017-09-13 ENCOUNTER — Telehealth: Payer: Self-pay | Admitting: Adult Health

## 2017-09-13 ENCOUNTER — Ambulatory Visit: Payer: BC Managed Care – PPO | Attending: Hematology and Oncology | Admitting: Physical Therapy

## 2017-09-13 ENCOUNTER — Other Ambulatory Visit: Payer: Self-pay

## 2017-09-13 ENCOUNTER — Ambulatory Visit
Admission: RE | Admit: 2017-09-13 | Discharge: 2017-09-13 | Disposition: A | Discharge status: Home / Self Care | Payer: BC Managed Care – PPO | Source: Ambulatory Visit | Attending: Radiation Oncology | Admitting: Radiation Oncology

## 2017-09-13 DIAGNOSIS — M25612 Stiffness of left shoulder, not elsewhere classified: Secondary | ICD-10-CM | POA: Insufficient documentation

## 2017-09-13 DIAGNOSIS — R293 Abnormal posture: Secondary | ICD-10-CM | POA: Insufficient documentation

## 2017-09-13 DIAGNOSIS — M6281 Muscle weakness (generalized): Secondary | ICD-10-CM | POA: Diagnosis present

## 2017-09-13 DIAGNOSIS — Z483 Aftercare following surgery for neoplasm: Secondary | ICD-10-CM | POA: Insufficient documentation

## 2017-09-13 DIAGNOSIS — Z510 Encounter for antineoplastic radiation therapy: Secondary | ICD-10-CM | POA: Diagnosis not present

## 2017-09-13 NOTE — Therapy (Signed)
[place], [place], [address] Phone: [phone]   Fax:  [phone]  Physical Therapy Treatment  Patient Details  Name: Haley Sanchez MRN: [id number] Date of Birth: [birth year] Referring Provider: Dr. Lindi Adie    Encounter Date: 09/13/2017  PT End of Session - 09/13/17 1833    Visit Number  2    Number of Visits  17    Date for PT Re-Evaluation  10/13/17    PT Start Time  1301    PT Stop Time  1345    PT Time Calculation (min)  44 min    Activity Tolerance  Patient tolerated treatment well    Behavior During Therapy  [facility] for tasks assessed/performed       Past Medical History:  Diagnosis Date  . Anxiety   . Asthma   . Cancer [facility])    breast cancer  . Complication of anesthesia   . Depression   . Family history of breast cancer   . Family history of prostate cancer   . Hypertension   . PONV (postoperative nausea and vomiting)   . Seasonal allergies     Past Surgical History:  Procedure Laterality Date  . BREAST LUMPECTOMY WITH RADIOACTIVE SEED AND SENTINEL LYMPH NODE BIOPSY Left 07/21/2017   Procedure: BREAST LUMPECTOMY WITH RADIOACTIVE SEED AND SENTINEL LYMPH NODE BIOPSY;  Surgeon: Rolm Bookbinder, MD;  Location: [facility];  Service: General;  Laterality: Left;  . PORTACATH PLACEMENT Right 03/28/2017   Procedure: INSERTION PORT-A-CATH WITH [place];  Surgeon: Rolm Bookbinder, MD;  Location: [facility];  Service: General;  Laterality: Right;    There were no vitals filed for this visit.  Subjective Assessment - 09/13/17 1307    Subjective  Pt says she arm is still numb in the upper arm She had to miss last week because of bronchitis with a cough.  She wants to do all she can so she will be ready to go back to school on Jan 2. She is currently receiving radiation treatment     Pertinent History  left breast cancer diagnosed 03/01/2017, chemotherapy 6/19-06/14/2017, stopped early due  to neuopathy symptoms in fingers and toes  that has been resolving, Left lumpectomy ( Dr. Donne Hazel) on 07/21/2017 with 4 nodes removed.  Left breast developed cellulitis and she is currently on antibiotics, CT simulation for radiation scheduled for 08/19/2017    Patient Stated Goals  i want to be able to have full mobility.  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  No/denies         [facility] PT Assessment - 09/13/17 0001      AROM   Left Shoulder Flexion  165 Degrees    Left Shoulder ABduction  160 Degrees                  [facility] Adult PT Treatment/Exercise - 09/13/17 0001      Self-Care   Other Self-Care Comments   issued information about a compression sleeve, gauntlet  and bra as she plans to take an extended plane flight next summer.  Script faxed to Dr. Lindi Adie       Lumbar Exercises: Supine   Ab Set  10 reps    AB Set Limitations  10 second hold with pelvic floor stabalization     Clam  10 reps  Clam Limitations  with cues for core stabaliztion     Heel Slides  10 reps    Bent Knee Raise  10 reps    Bent Knee Raise Limitations  cues to limit stretch and stablize core     Other Supine Lumbar Exercises  anterior and posterior pelvic tilt       Shoulder Exercises: Pulleys   Flexion  2 minutes    Flexion Limitations  cues to pause at the top to deepen the stretch     ABduction  2 minutes      Shoulder Exercises: ROM/Strengthening   Wall Wash  5 reps with towel in flexion and circumduction     Ball on Wall  up and down the wall into felxion with pause at the top x 10 reps    Other ROM/Strengthening Exercises  modified downward for side body stretch                 Short Term Clinic Goals - 09/13/17 1832      CC Short Term Goal  #1   Title  Patient with verbalize an understanding of lymphedema risk reduction precautions    Baseline  signed up to attend ABC class    Time  4    Status   On-going      CC Short Term Goal  #2   Title  Patient will improve left shoulder abduction to 120 degrees so that she can achieve position needed for radiation therapy.    Status  Achieved      CC Short Term Goal  #3   Title  Pt will be independent in home exercise program for left shoulder range of motion     Time  4    Period  Weeks    Status  On-going          Long Term Clinic Goals - 09/13/17 1833      CC Long Term Goal  #1   Title  Pt will have decrease in quick DASH score to < 10 indicating fucntional improvment in lef UE    Baseline  31.82 on eval     Time  8    Status  On-going      CC Long Term Goal  #2   Title  Pt will be independent in home program for upper body strength to decrease lymphedema risk and prepare for return to work     Time  8    Period  Weeks    Status  On-going      CC Long Term Goal  #3   Title  Pt will have 150 degrees of left shoulder abduction  so that she can perform overhead activities with ease     Status  Achieved         Plan - 09/13/17 1834    Clinical Impression Statement  Despite missing PT due to a cold, pt has achieved her range of motion goal.  Progressed exercise today and began instruction in lymphedema risk reduction.  Pt will attend ABC class and get compresson garments. 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 Cystitis 05/06/2017  . Hematuria 05/05/2017  . Genetic testing 04/29/2017  . Family history of breast cancer   . Family history of prostate cancer   . Port catheter in place 04/05/2017  . Malignant neoplasm of lower-outer quadrant of left breast of female, estrogen receptor positive ([place]) 03/03/2017   Donato Heinz. Owens Shark PT  Norwood Levo 09/13/2017, 6:39 PM  [place], [place], [address] Phone: [phone]   Fax:  [phone]  Name: Haley Sanchez MRN: [id number] Date of Birth: [birth year]

## 2017-09-13 NOTE — Telephone Encounter (Signed)
Scheduled appt per 12/2 sch message - left message with appt date and time.

## 2017-09-13 NOTE — Patient Instructions (Addendum)
First of all, check with your insurance company to see if provider is in [place]   (for wigs and compression sleeves / gloves/gauntlets )  [place], [place] [address] [phone]  Will file some insurances --- call for appointment   [facility] to [facility] (for mastectomy prosthetics and garments) [place], [place] [address] [phone] Will file some insurances --- call for appointment  [facility]  [address]  [place], [place] [address] [phone] Lower extremity garments  [facility] [address] [place], [place]  [address] [place] Sales rep:  [place]:  [phone] [email or web address] Biocompression pumps   [employer]  Sales rep: Donneta Romberg:  [phone] [email or web address] Lyndal Pulley and Flexitouch pumps    Other Resources: National Lymphedema Network:  www.lymphnet.org www.Klosetraining.com for patient articles and purchase a self manual lymph drainage DVD www.[email or web address] has informative articles.      PELVIC TILT  Lie on back, legs bent. Exhale, tilting top of pelvis back, pubic bone up, to flatten lower back. Inhale, rolling pelvis opposite way, top forward, pubic bone down, arch in back. Repeat __10__ times. Do __2__ sessions per day. Copyright  VHI. All rights reserved.     Lie with hips and knees bent. Slowly inhale, and then exhale. Pull navel toward spine and tighten pelvic floor. Hold for __10_ seconds. Continue to breathe in and out during hold. Rest for _10__ seconds. Repeat __10_ times. Do __2-3_ times a day.   Copyright  VHI. All rights reserved.  Knee Fold   Lie on back, legs bent, arms by sides. Exhale, lifting knee to chest. Inhale, returning. Keep abdominals flat, navel to spine. Repeat __10__ times, alternating legs. Do __2__ sessions per day.  Copyright  VHI. All rights reserved.  Knee  Drop   Keep pelvis stable. Without rotating hips, slowly drop knee to side, pause, return to center, bring knee across midline toward opposite hip. Feel obliques engaging. Repeat for ___10_ times each leg.  Copyright  VHI. All rights reserved.  Isometric Hold With Pelvic Floor (Hook-Lying)    Copyright  VHI. All rights reserved.  Heel Slide to Straight   Slide one leg down to straight. Return. Be sure pelvis does not rock forward, tilt, rotate, or tip to side. Do _10__ times. Restabilize pelvis. Repeat with other leg. Do __1-2_ sets, __2_ times per day.  http://ss.exer.us/16   Copyright  VHI. All rights reserved.

## 2017-09-14 ENCOUNTER — Ambulatory Visit
Admission: RE | Admit: 2017-09-14 | Discharge: 2017-09-14 | Disposition: A | Discharge status: Home / Self Care | Payer: BC Managed Care – PPO | Source: Ambulatory Visit | Attending: Radiation Oncology | Admitting: Radiation Oncology

## 2017-09-14 DIAGNOSIS — Z510 Encounter for antineoplastic radiation therapy: Secondary | ICD-10-CM | POA: Diagnosis not present

## 2017-09-15 ENCOUNTER — Ambulatory Visit: Payer: BC Managed Care – PPO

## 2017-09-15 ENCOUNTER — Encounter: Payer: Self-pay | Admitting: Physical Therapy

## 2017-09-15 ENCOUNTER — Ambulatory Visit
Admission: RE | Admit: 2017-09-15 | Discharge: 2017-09-15 | Disposition: A | Discharge status: Home / Self Care | Payer: BC Managed Care – PPO | Source: Ambulatory Visit | Attending: Radiation Oncology | Admitting: Radiation Oncology

## 2017-09-15 ENCOUNTER — Other Ambulatory Visit: Payer: Self-pay

## 2017-09-15 ENCOUNTER — Ambulatory Visit: Payer: BC Managed Care – PPO | Admitting: Physical Therapy

## 2017-09-15 DIAGNOSIS — M6281 Muscle weakness (generalized): Secondary | ICD-10-CM

## 2017-09-15 DIAGNOSIS — Z483 Aftercare following surgery for neoplasm: Secondary | ICD-10-CM | POA: Diagnosis not present

## 2017-09-15 DIAGNOSIS — Z510 Encounter for antineoplastic radiation therapy: Secondary | ICD-10-CM | POA: Diagnosis not present

## 2017-09-15 DIAGNOSIS — M25612 Stiffness of left shoulder, not elsewhere classified: Secondary | ICD-10-CM

## 2017-09-15 DIAGNOSIS — R293 Abnormal posture: Secondary | ICD-10-CM

## 2017-09-15 NOTE — Therapy (Signed)
[place], [place], [address] Phone: [phone]   Fax:  [phone]  Physical Therapy Treatment  Patient Details  Name: Haley Sanchez MRN: [id number] Date of Birth: 1961-05-25 Referring Provider: Dr. Lindi Adie    Encounter Date: 09/15/2017  PT End of Session - 09/15/17 1247    Visit Number  3    Number of Visits  17    Date for PT Re-Evaluation  10/13/17    PT Start Time  0539    PT Stop Time  1103    PT Time Calculation (min)  48 min    Activity Tolerance  Patient tolerated treatment well    Behavior During Therapy  [facility] for tasks assessed/performed       Past Medical History:  Diagnosis Date  . Anxiety   . Asthma   . Cancer [facility])    breast cancer  . Complication of anesthesia   . Depression   . Family history of breast cancer   . Family history of prostate cancer   . Hypertension   . PONV (postoperative nausea and vomiting)   . Seasonal allergies     Past Surgical History:  Procedure Laterality Date  . BREAST LUMPECTOMY WITH RADIOACTIVE SEED AND SENTINEL LYMPH NODE BIOPSY Left 07/21/2017   Procedure: BREAST LUMPECTOMY WITH RADIOACTIVE SEED AND SENTINEL LYMPH NODE BIOPSY;  Surgeon: Rolm Bookbinder, MD;  Location: [place];  Service: General;  Laterality: Left;  . PORTACATH PLACEMENT Right 03/28/2017   Procedure: INSERTION PORT-A-CATH WITH [place];  Surgeon: Rolm Bookbinder, MD;  Location: [place];  Service: General;  Laterality: Right;    There were no vitals filed for this visit.  Subjective Assessment - 09/15/17 1240    Subjective  Pt is doing well today, actively preparing for return to work     Pertinent History  left breast cancer diagnosed 03/01/2017, chemotherapy 6/19-06/14/2017, stopped early due to neuopathy symptoms in fingers and toes  that has been resolving, Left lumpectomy ( Dr. Donne Hazel) on 07/21/2017 with 4 nodes removed.  Left breast developed cellulitis and  she is currently on antibiotics, CT simulation for radiation scheduled for 08/19/2017    Patient Stated Goals  i want to be able to have full mobility.  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  No/denies                      [facility] Adult PT Treatment/Exercise - 09/15/17 0001      Self-Care   Other Self-Care Comments   pt given prescription for compression sleeve, gauntlet and bra/vest to take to MD appt tomorrow for signiture       Exercises   Exercises  Other Exercises    Other Exercises   instructed in basics of Strength ABC program and how to progress resistance exercise . Pt acknowledged  Also discuess other strategies for fatigue managment at return to work, specifically to build a rest period into her schedule midday       Lumbar Exercises: Aerobic   Tread Mill  1.5 mph at 0% grade for 11 minutes while discussing other aspects of self care.  rate of perceived exertion : "moderate"       Shoulder Exercises: Stretch   Other Shoulder Stretches  UE stretch portion of strength ABC program  PT Education - 09/15/17 1246    Education provided  Yes    Education Details  stregth ABC program     Person(s) Educated  Patient    Methods  Explanation;Demonstration;Handout    Comprehension  Need further instruction        Short Term Clinic Goals - 09/13/17 1832      CC Short Term Goal  #1   Title  Patient with verbalize an understanding of lymphedema risk reduction precautions    Baseline  signed up to attend ABC class    Time  4    Status  On-going      CC Short Term Goal  #2   Title  Patient will improve left shoulder abduction to 120 degrees so that she can achieve position needed for radiation therapy.    Status  Achieved      CC Short Term Goal  #3   Title  Pt will be independent in home exercise program for left shoulder range of motion     Time  4    Period  Weeks    Status   On-going          Long Term Clinic Goals - 09/13/17 1833      CC Long Term Goal  #1   Title  Pt will have decrease in quick DASH score to < 10 indicating fucntional improvment in lef UE    Baseline  31.82 on eval     Time  8    Status  On-going      CC Long Term Goal  #2   Title  Pt will be independent in home program for upper body strength to decrease lymphedema risk and prepare for return to work     Time  8    Period  Weeks    Status  On-going      CC Long Term Goal  #3   Title  Pt will have 150 degrees of left shoulder abduction  so that she can perform overhead activities with ease     Status  Achieved         Plan - 09/15/17 1248    Clinical Impression Statement  Pt continues to do well with radition and is working toward increasing general strength Anticipate she will improve with exercise     Rehab Potential  Good    Clinical Impairments Affecting Rehab Potential  ongoing radiation     PT Frequency  2x / week    PT Duration  8 weeks    PT Treatment/Interventions  ADLs/Self Care Home Management;Neuromuscular re-education;Scar mobilization;Passive range of motion;Patient/family education;DME Instruction;Therapeutic activities;Therapeutic exercise;Manual techniques;Taping    PT Next Visit Plan  continue  Strength ABC program with warm up on treadmill, review UE stretches and teach from LE stretches to rest of program.in preparation for return to work , check to see if scripts are returned       Patient will benefit from skilled therapeutic intervention in order to improve the following deficits and impairments:  Decreased skin integrity, Increased edema, Decreased scar mobility, Decreased knowledge of precautions, Decreased activity tolerance, Decreased strength, Impaired UE functional use, Pain, Increased muscle spasms, Decreased range of motion, Postural dysfunction  Visit Diagnosis: Aftercare following surgery for neoplasm  Abnormal posture  Stiffness of left  shoulder joint  Muscle weakness (generalized)     Problem List Patient Active Problem List   Diagnosis Date Noted  . Cystitis 05/06/2017  . Hematuria 05/05/2017  . Genetic  testing 04/29/2017  . Family history of breast cancer   . Family history of prostate cancer   . Port catheter in place 04/05/2017  . Malignant neoplasm of lower-outer quadrant of left breast of female, estrogen receptor positive ([place]) 03/03/2017   Donato Heinz. Owens Shark PT  Norwood Levo 09/15/2017, 12:50 PM  [place], [place], [address] Phone: [phone]   Fax:  [phone]  Name: Haley Sanchez MRN: [id number] Date of Birth: 10/25/1960

## 2017-09-15 NOTE — Patient Instructions (Signed)
[email or web address] Courses Online Strength After Breast Cancer Look at the right of the page for Lymphedema Education Session  

## 2017-09-16 ENCOUNTER — Ambulatory Visit (HOSPITAL_BASED_OUTPATIENT_CLINIC_OR_DEPARTMENT_OTHER): Payer: BC Managed Care – PPO | Admitting: Hematology and Oncology

## 2017-09-16 ENCOUNTER — Ambulatory Visit
Admission: RE | Admit: 2017-09-16 | Discharge: 2017-09-16 | Disposition: A | Discharge status: Home / Self Care | Payer: BC Managed Care – PPO | Source: Ambulatory Visit | Attending: Radiation Oncology | Admitting: Radiation Oncology

## 2017-09-16 ENCOUNTER — Ambulatory Visit (HOSPITAL_BASED_OUTPATIENT_CLINIC_OR_DEPARTMENT_OTHER): Payer: BC Managed Care – PPO

## 2017-09-16 ENCOUNTER — Other Ambulatory Visit (HOSPITAL_BASED_OUTPATIENT_CLINIC_OR_DEPARTMENT_OTHER): Payer: BC Managed Care – PPO

## 2017-09-16 ENCOUNTER — Telehealth: Payer: Self-pay | Admitting: Hematology and Oncology

## 2017-09-16 DIAGNOSIS — C50512 Malignant neoplasm of lower-outer quadrant of left female breast: Secondary | ICD-10-CM

## 2017-09-16 DIAGNOSIS — Z5112 Encounter for antineoplastic immunotherapy: Secondary | ICD-10-CM | POA: Diagnosis not present

## 2017-09-16 DIAGNOSIS — Z170 Estrogen receptor positive status [ER+]: Principal | ICD-10-CM

## 2017-09-16 DIAGNOSIS — Z510 Encounter for antineoplastic radiation therapy: Secondary | ICD-10-CM | POA: Diagnosis not present

## 2017-09-16 LAB — CBC WITH DIFFERENTIAL/PLATELET
BASO%: 0.1 % (ref 0.0–2.0)
BASOS ABS: 0 10*3/uL (ref 0.0–0.1)
EOS ABS: 0 10*3/uL (ref 0.0–0.5)
EOS%: 0.3 % (ref 0.0–7.0)
HCT: 39.5 % (ref 34.8–46.6)
HGB: 13.4 g/dL (ref 11.6–15.9)
LYMPH%: 9.2 % — ABNORMAL LOW (ref 14.0–49.7)
MCH: 30.9 pg (ref 25.1–34.0)
MCHC: 33.9 g/dL (ref 31.5–36.0)
MCV: 91 fL (ref 79.5–101.0)
MONO#: 0.8 10*3/uL (ref 0.1–0.9)
MONO%: 7.7 % (ref 0.0–14.0)
NEUT#: 8.4 10*3/uL — ABNORMAL HIGH (ref 1.5–6.5)
NEUT%: 82.7 % — AB (ref 38.4–76.8)
NRBC: 0 % (ref 0–0)
PLATELETS: 236 10*3/uL (ref 145–400)
RBC: 4.34 10*6/uL (ref 3.70–5.45)
RDW: 12.9 % (ref 11.2–14.5)
WBC: 10.1 10*3/uL (ref 3.9–10.3)
lymph#: 0.9 10*3/uL (ref 0.9–3.3)

## 2017-09-16 LAB — COMPREHENSIVE METABOLIC PANEL
ALBUMIN: 3.5 g/dL (ref 3.5–5.0)
ALK PHOS: 84 U/L (ref 40–150)
ALT: 16 U/L (ref 0–55)
ANION GAP: 10 meq/L (ref 3–11)
AST: 13 U/L (ref 5–34)
BILIRUBIN TOTAL: 0.25 mg/dL (ref 0.20–1.20)
BUN: 22.5 mg/dL (ref 7.0–26.0)
CO2: 22 meq/L (ref 22–29)
Calcium: 8.5 mg/dL (ref 8.4–10.4)
Chloride: 107 mEq/L (ref 98–109)
Creatinine: 1.2 mg/dL — ABNORMAL HIGH (ref 0.6–1.1)
EGFR: 50 mL/min/{1.73_m2} — AB (ref >60)
GLUCOSE: 84 mg/dL (ref 70–140)
POTASSIUM: 4 meq/L (ref 3.5–5.1)
SODIUM: 139 meq/L (ref 136–145)
TOTAL PROTEIN: 6.3 g/dL — AB (ref 6.4–8.3)

## 2017-09-16 MED ORDER — ACETAMINOPHEN 325 MG PO TABS
ORAL_TABLET | ORAL | Status: AC
  Filled 2017-09-16: qty 2

## 2017-09-16 MED ORDER — DIPHENHYDRAMINE HCL 25 MG PO CAPS
50.0000 mg | ORAL_CAPSULE | Freq: Once | ORAL | Status: AC
  Administered 2017-09-16: 50 mg via ORAL

## 2017-09-16 MED ORDER — SODIUM CHLORIDE 0.9% FLUSH
10.0000 mL | INTRAVENOUS | Status: DC | PRN
  Administered 2017-09-16: 10 mL
  Filled 2017-09-16: qty 10

## 2017-09-16 MED ORDER — ACETAMINOPHEN 325 MG PO TABS
650.0000 mg | ORAL_TABLET | Freq: Once | ORAL | Status: AC
  Administered 2017-09-16: 650 mg via ORAL

## 2017-09-16 MED ORDER — SODIUM CHLORIDE 0.9 % IV SOLN
Freq: Once | INTRAVENOUS | Status: AC
  Administered 2017-09-16: 14:00:00 via INTRAVENOUS

## 2017-09-16 MED ORDER — DIPHENHYDRAMINE HCL 25 MG PO CAPS
ORAL_CAPSULE | ORAL | Status: AC
  Filled 2017-09-16: qty 2

## 2017-09-16 MED ORDER — HEPARIN SOD (PORK) LOCK FLUSH 100 UNIT/ML IV SOLN
500.0000 [IU] | Freq: Once | INTRAVENOUS | Status: AC | PRN
  Administered 2017-09-16: 500 [IU]
  Filled 2017-09-16: qty 5

## 2017-09-16 MED ORDER — SODIUM CHLORIDE 0.9 % IV SOLN
6.0000 mg/kg | Freq: Once | INTRAVENOUS | Status: AC
  Administered 2017-09-16: 504 mg via INTRAVENOUS
  Filled 2017-09-16: qty 24

## 2017-09-16 NOTE — Progress Notes (Signed)
Patient Care Team: Orpah Melter, MD as PCP - General (Family Medicine) Excell Seltzer, MD as Consulting Physician (General Surgery) Magrinat, Virgie Dad, MD as Consulting Physician (Oncology) Eppie Gibson, MD as Attending Physician (Radiation Oncology) Huel Cote, NP as Nurse Practitioner (Obstetrics and Gynecology)  DIAGNOSIS:  Encounter Diagnosis  Name Primary?  . Malignant neoplasm of lower-outer quadrant of left breast of female, estrogen receptor positive ([place])     SUMMARY OF ONCOLOGIC HISTORY:   Malignant neoplasm of lower-outer quadrant of left breast of female, estrogen receptor positive ([place])   03/01/2017 Initial Diagnosis    Left breast 2:30 position: 2.1 cm irregular solid mass, additional 0.8 cm oval mass in the left breast (fibrocystic); primary mass on biopsy revealed IDC grade 3, ER 100%, PR 80%, Ki-67 25%, HER-2 positive ratio 1.88, copy #6.2, T2 N0 stage II a clinical stage      03/29/2017 - 06/14/2017 Neo-Adjuvant Chemotherapy    Neoadjuvant chemotherapy with 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  The Multi-Gene Panel offered by [facility] includes sequencing and/or deletion duplication testing of the following 80 genes: ALK, APC, ATM, AXIN2,BAP1,  BARD1, BLM, BMPR1A, BRCA1, BRCA2, BRIP1, CASR, CDC73, CDH1, CDK4, CDKN1B, CDKN1C, CDKN2A (p14ARF), CDKN2A (p16INK4a), CEBPA, CHEK2, CTNNA1, DICER1, DIS3L2, EGFR (c.2369C>T, p.Thr790Met variant only), EPCAM (Deletion/duplication testing only), FH, FLCN, GATA2, GPC3, GREM1 (Promoter region deletion/duplication testing only), HOXB13 (c.251G>A, p.Gly84Glu), HRAS, KIT, MAX, MEN1, MET, MITF (c.952G>A, p.Glu318Lys variant only), MLH1, MSH2, MSH3, MSH6, MUTYH, NBN, NF1, NF2, NTHL1, PALB2, PDGFRA, PHOX2B, PMS2, POLD1, POLE, POT1, PRKAR1A, PTCH1, PTEN, RAD50, RAD51C, RAD51D, RB1, RECQL4, RET, RUNX1, SDHAF2, SDHA (sequence changes only),  SDHB, SDHC, SDHD, SMAD4, SMARCA4, SMARCB1, SMARCE1, STK11, SUFU, TERT, TERT, TMEM127, TP53, TSC1, TSC2, VHL, WRN and WT1.  The report date is April 27, 2017.       07/21/2017 Surgery    Left lumpectomy: Grade 2 IDC 1.9 cm with DCIS, margins negative, 0/4 lymph nodes negative, ER 100%, PR 80%, HER-2 positive, Ki-67 25%, RCB class II,T1c N0 stage IA      08/29/2017 -  Radiation Therapy    Adjuvant radiation       CHIEF COMPLIANT: Follow-up on Herceptin  INTERVAL HISTORY: Haley Sanchez is a 56 year old with above-mentioned history of left breast cancer who was treated with neoadjuvant chemotherapy and then underwent lumpectomy.  She is currently on adjuvant Herceptin.  She appears to be tolerating the treatment fairly well.  She is also receiving adjuvant radiation therapy.  The soreness in the breast from the prior surgery has improved significantly.  REVIEW OF SYSTEMS:   Constitutional: Denies fevers, chills or abnormal weight loss Eyes: Denies blurriness of vision Ears, nose, mouth, throat, and face: Denies mucositis or sore throat Respiratory: Denies cough, dyspnea or wheezes Cardiovascular: Denies palpitation, chest discomfort Gastrointestinal:  Denies nausea, heartburn or change in bowel habits Skin: Denies abnormal skin rashes Lymphatics: Denies new lymphadenopathy or easy bruising Neurological:Denies numbness, tingling or new weaknesses Behavioral/Psych: Mood is stable, no new changes  Extremities: No lower extremity edema  All other systems were reviewed with the patient and are negative.  I have reviewed the past medical history, past surgical history, social history and family history with the patient and they are unchanged from previous note.  ALLERGIES:  is allergic to no known allergies.  MEDICATIONS:  Current Outpatient Medications  Medication Sig Dispense Refill  . albuterol (PROVENTIL  HFA;VENTOLIN HFA) 108 (90 Base) MCG/ACT inhaler Inhale 2 puffs into the lungs  every 6 (six) hours as needed for wheezing or shortness of breath.    Marland Kitchen buPROPion (WELLBUTRIN XL) 300 MG 24 hr tablet Take 300 mg by mouth daily.    Marland Kitchen doxycycline (ORACEA) 40 MG capsule Take 40 mg every morning by mouth.    . lidocaine-prilocaine (EMLA) cream     . losartan (COZAAR) 100 MG tablet Take 100 mg by mouth daily.   0  . ondansetron (ZOFRAN) 8 MG tablet Take by mouth.    . oxyCODONE (OXY IR/ROXICODONE) 5 MG immediate release tablet Take 1 tablet (5 mg total) by mouth every 6 (six) hours as needed for moderate pain, severe pain or breakthrough pain. 15 tablet 0  . PARoxetine (PAXIL) 40 MG tablet Take 40 mg by mouth every evening.     . prochlorperazine (COMPAZINE) 10 MG tablet Take by mouth.    . spironolactone (ALDACTONE) 25 MG tablet Take 0.5 tablets (12.5 mg total) daily by mouth. 90 tablet 3  . triamcinolone ointment (KENALOG) 0.5 % Apply 1 application topically 2 (two) times daily. Apply topically as needed 30 g 0   No current facility-administered medications for this visit.     PHYSICAL EXAMINATION: ECOG PERFORMANCE STATUS: 1 - Symptomatic but completely ambulatory  Vitals:   09/16/17 1225  BP: (!) 142/83  Pulse: 93  Resp: 16  Temp: 98.7 F (37.1 C)  SpO2: 98%   Filed Weights   09/16/17 1225  Weight: 190 lb 14.4 oz (86.6 kg)    GENERAL:alert, no distress and comfortable SKIN: skin color, texture, turgor are normal, no rashes or significant lesions EYES: normal, Conjunctiva are pink and non-injected, sclera clear OROPHARYNX:no exudate, no erythema and lips, buccal mucosa, and tongue normal  NECK: supple, thyroid normal size, non-tender, without nodularity LYMPH:  no palpable lymphadenopathy in the cervical, axillary or inguinal LUNGS: clear to auscultation and percussion with normal breathing effort HEART: regular rate & rhythm and no murmurs and no lower extremity edema ABDOMEN:abdomen soft, non-tender and normal bowel sounds MUSCULOSKELETAL:no cyanosis of  digits and no clubbing  NEURO: alert & oriented x 3 with fluent speech, no focal motor/sensory deficits EXTREMITIES: No lower extremity edema  LABORATORY DATA:  I have reviewed the data as listed   Chemistry      Component Value Date/Time   NA 142 08/16/2017 1140   K 4.1 08/16/2017 1140   CO2 28 08/16/2017 1140   BUN 12.6 08/16/2017 1140   CREATININE 1.0 08/16/2017 1140      Component Value Date/Time   CALCIUM 8.9 08/16/2017 1140   ALKPHOS 100 08/16/2017 1140   AST 21 08/16/2017 1140   ALT 25 08/16/2017 1140   BILITOT 0.24 08/16/2017 1140       Lab Results  Component Value Date   WBC 3.6 (L) 08/16/2017   HGB 12.1 08/16/2017   HCT 35.9 08/16/2017   MCV 90.9 08/16/2017   PLT 220 08/16/2017   NEUTROABS 1.9 08/16/2017    ASSESSMENT & PLAN:  Malignant neoplasm of lower-outer 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 Neoadjuvant Taxol Herceptin weekly 11 completed 09/04/2018followed by Herceptin maintenance for  6 months 2. breast conserving surgery with sentinel lymph node biopsy done on 07/21/2017 07/21/2017 Left lumpectomy: Grade 2 IDC 1.9 cm with DCIS, margins negative, 0/4 lymph nodes negative, ER 100%, PR 80%, HER-2 positive, Ki-67 25%, RCB class II,T1c N0 stage IA  Current treatment: 1. Continue adjuvant Herceptin 2. Adjuvant radiation therapy started 08/29/2017 3. Followed by adjuvant antiestrogen therapy  Chemotherapy-induced peripheral neuropathy: Being closely monitored I give the patient prescription for compression sleeves, week, breast prosthesis and gauntlet  Return to clinic every 3 weeks for Herceptin every 6 weeks for follow-up with me We will  discuss starting antiestrogen therapy when she comes back to see me in 6 weeks.  I spent 25 minutes talking to the patient of which more than half was spent in counseling and coordination of care.  No orders of the defined types were placed in this encounter.  The patient has a good understanding of the overall plan. she agrees with it. she will call with any problems that may develop before the next visit here.   Rulon Eisenmenger, MD 09/16/17

## 2017-09-16 NOTE — Assessment & Plan Note (Signed)
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 Neoadjuvant Taxol Herceptin weekly 11 completed 09/04/2018followed by Herceptin maintenance for 6 months 2. breast conserving surgery with sentinel lymph node biopsy done on 07/21/2017 07/21/2017 Left lumpectomy: Grade 2 IDC 1.9 cm with DCIS, margins negative, 0/4 lymph nodes negative, ER 100%, PR 80%, HER-2 positive, Ki-67 25%, RCB class II,T1c N0 stage IA  Current treatment: 1. Continue adjuvant Herceptin 2. Adjuvant radiation therapy started 08/29/2017 3. Followed by adjuvant antiestrogen therapy  Chemotherapy-induced peripheral neuropathy: Being closely monitored  Return to clinic every 3 weeks for Herceptin every 6 weeks for follow-up with

## 2017-09-16 NOTE — Patient Instructions (Signed)
[facility] Discharge Instructions for Patients Receiving Chemotherapy Today you received the following chemotherapy agents:  Herceptin To help prevent nausea and vomiting after your treatment, we encourage you to take your nausea medication as prescribed.   If you develop nausea and vomiting that is not controlled by your nausea medication, call the clinic.   BELOW ARE SYMPTOMS THAT SHOULD BE REPORTED IMMEDIATELY:  *FEVER GREATER THAN 100.5 F  *CHILLS WITH OR WITHOUT FEVER  NAUSEA AND VOMITING THAT IS NOT CONTROLLED WITH YOUR NAUSEA MEDICATION  *UNUSUAL SHORTNESS OF BREATH  *UNUSUAL BRUISING OR BLEEDING  TENDERNESS IN MOUTH AND THROAT WITH OR WITHOUT PRESENCE OF ULCERS  *URINARY PROBLEMS  *BOWEL PROBLEMS  UNUSUAL RASH Items with * indicate a potential emergency and should be followed up as soon as possible.  Feel free to call the clinic should you have any questions or concerns. The clinic phone number is [phone].  Please show the CHEMO ALERT CARD at check-in to the Emergency Department and triage nurse.   

## 2017-09-16 NOTE — Telephone Encounter (Signed)
Gave patient avs and calendar with appts per 12/7 los

## 2017-09-19 ENCOUNTER — Ambulatory Visit: Payer: BC Managed Care – PPO | Admitting: Physical Therapy

## 2017-09-19 ENCOUNTER — Ambulatory Visit: Admission: RE | Admit: 2017-09-19 | Payer: BC Managed Care – PPO | Source: Ambulatory Visit

## 2017-09-19 ENCOUNTER — Ambulatory Visit: Payer: BC Managed Care – PPO | Admitting: Radiation Oncology

## 2017-09-20 ENCOUNTER — Ambulatory Visit
Admission: RE | Admit: 2017-09-20 | Discharge: 2017-09-20 | Disposition: A | Discharge status: Home / Self Care | Payer: BC Managed Care – PPO | Source: Ambulatory Visit | Attending: Radiation Oncology | Admitting: Radiation Oncology

## 2017-09-20 ENCOUNTER — Ambulatory Visit: Payer: BC Managed Care – PPO | Admitting: Radiation Oncology

## 2017-09-20 ENCOUNTER — Encounter: Payer: Self-pay | Admitting: Radiation Oncology

## 2017-09-20 ENCOUNTER — Ambulatory Visit: Payer: BC Managed Care – PPO

## 2017-09-20 DIAGNOSIS — Z510 Encounter for antineoplastic radiation therapy: Secondary | ICD-10-CM | POA: Diagnosis not present

## 2017-09-20 DIAGNOSIS — C50512 Malignant neoplasm of lower-outer quadrant of left female breast: Secondary | ICD-10-CM

## 2017-09-20 DIAGNOSIS — Z170 Estrogen receptor positive status [ER+]: Principal | ICD-10-CM

## 2017-09-21 ENCOUNTER — Ambulatory Visit
Admission: RE | Admit: 2017-09-21 | Discharge: 2017-09-21 | Disposition: A | Discharge status: Home / Self Care | Payer: BC Managed Care – PPO | Source: Ambulatory Visit | Attending: Radiation Oncology | Admitting: Radiation Oncology

## 2017-09-21 ENCOUNTER — Ambulatory Visit: Payer: BC Managed Care – PPO | Admitting: Radiation Oncology

## 2017-09-21 DIAGNOSIS — Z510 Encounter for antineoplastic radiation therapy: Secondary | ICD-10-CM | POA: Diagnosis not present

## 2017-09-22 ENCOUNTER — Ambulatory Visit
Admission: RE | Admit: 2017-09-22 | Discharge: 2017-09-22 | Disposition: A | Discharge status: Home / Self Care | Payer: BC Managed Care – PPO | Source: Ambulatory Visit | Attending: Radiation Oncology | Admitting: Radiation Oncology

## 2017-09-22 ENCOUNTER — Encounter: Payer: BC Managed Care – PPO | Admitting: Nutrition

## 2017-09-22 DIAGNOSIS — Z510 Encounter for antineoplastic radiation therapy: Secondary | ICD-10-CM | POA: Diagnosis not present

## 2017-09-23 ENCOUNTER — Ambulatory Visit: Payer: BC Managed Care – PPO

## 2017-09-26 ENCOUNTER — Ambulatory Visit: Payer: BC Managed Care – PPO

## 2017-09-26 ENCOUNTER — Ambulatory Visit
Admission: RE | Admit: 2017-09-26 | Discharge: 2017-09-26 | Disposition: A | Discharge status: Home / Self Care | Payer: BC Managed Care – PPO | Source: Ambulatory Visit | Attending: Radiation Oncology | Admitting: Radiation Oncology

## 2017-09-26 DIAGNOSIS — Z510 Encounter for antineoplastic radiation therapy: Secondary | ICD-10-CM | POA: Diagnosis not present

## 2017-09-27 ENCOUNTER — Ambulatory Visit
Admission: RE | Admit: 2017-09-27 | Discharge: 2017-09-27 | Disposition: A | Discharge status: Home / Self Care | Payer: BC Managed Care – PPO | Source: Ambulatory Visit | Attending: Radiation Oncology | Admitting: Radiation Oncology

## 2017-09-27 ENCOUNTER — Encounter: Payer: Self-pay | Admitting: Radiation Oncology

## 2017-09-27 ENCOUNTER — Ambulatory Visit: Payer: BC Managed Care – PPO

## 2017-09-27 DIAGNOSIS — Z170 Estrogen receptor positive status [ER+]: Principal | ICD-10-CM

## 2017-09-27 DIAGNOSIS — Z510 Encounter for antineoplastic radiation therapy: Secondary | ICD-10-CM | POA: Diagnosis not present

## 2017-09-27 DIAGNOSIS — C50512 Malignant neoplasm of lower-outer quadrant of left female breast: Secondary | ICD-10-CM

## 2017-09-28 ENCOUNTER — Ambulatory Visit: Payer: BC Managed Care – PPO

## 2017-09-28 ENCOUNTER — Ambulatory Visit
Admission: RE | Admit: 2017-09-28 | Discharge: 2017-09-28 | Disposition: A | Discharge status: Home / Self Care | Payer: BC Managed Care – PPO | Source: Ambulatory Visit | Attending: Radiation Oncology | Admitting: Radiation Oncology

## 2017-09-28 DIAGNOSIS — R293 Abnormal posture: Secondary | ICD-10-CM

## 2017-09-28 DIAGNOSIS — Z483 Aftercare following surgery for neoplasm: Secondary | ICD-10-CM | POA: Diagnosis not present

## 2017-09-28 DIAGNOSIS — M25612 Stiffness of left shoulder, not elsewhere classified: Secondary | ICD-10-CM

## 2017-09-28 DIAGNOSIS — M6281 Muscle weakness (generalized): Secondary | ICD-10-CM

## 2017-09-28 DIAGNOSIS — Z510 Encounter for antineoplastic radiation therapy: Secondary | ICD-10-CM | POA: Diagnosis not present

## 2017-09-28 NOTE — Therapy (Signed)
[place], [place], [address] Phone: [phone]   Fax:  [phone]  Physical Therapy Treatment  Patient Details  Name: Haley Sanchez MRN: [id number] Date of Birth: [birth year] Referring Provider: Dr. Lindi Adie    Encounter Date: 09/28/2017  PT End of Session - 09/28/17 1700    Visit Number  4    Number of Visits  17    Date for PT Re-Evaluation  10/13/17    PT Start Time  1606    PT Stop Time  1645    PT Time Calculation (min)  39 min    Activity Tolerance  Patient tolerated treatment well    Behavior During Therapy  [facility] for tasks assessed/performed       Past Medical History:  Diagnosis Date  . Anxiety   . Asthma   . Cancer [facility])    breast cancer  . Complication of anesthesia   . Depression   . Family history of breast cancer   . Family history of prostate cancer   . Hypertension   . PONV (postoperative nausea and vomiting)   . Seasonal allergies     Past Surgical History:  Procedure Laterality Date  . BREAST LUMPECTOMY WITH RADIOACTIVE SEED AND SENTINEL LYMPH NODE BIOPSY Left 07/21/2017   Procedure: BREAST LUMPECTOMY WITH RADIOACTIVE SEED AND SENTINEL LYMPH NODE BIOPSY;  Surgeon: Rolm Bookbinder, MD;  Location: [place];  Service: General;  Laterality: Left;  . PORTACATH PLACEMENT Right 03/28/2017   Procedure: INSERTION PORT-A-CATH WITH [place];  Surgeon: Rolm Bookbinder, MD;  Location: [place];  Service: General;  Laterality: Right;    There were no vitals filed for this visit.  Subjective Assessment - 09/28/17 1613    Subjective  I'm doing really good. I've been parking my car farther in the lot so I have to walk more when I can. Also able to walk my dogs farther. I went out of town last week and was able to walk as much as I wanted without feeling fatigue.     Pertinent History  left breast cancer diagnosed 03/01/2017, chemotherapy 6/19-06/14/2017, stopped early due to  neuopathy symptoms in fingers and toes  that has been resolving, Left lumpectomy ( Dr. Donne Hazel) on 07/21/2017 with 4 nodes removed.  Left breast developed cellulitis and she is currently on antibiotics, CT simulation for radiation scheduled for 08/19/2017    Patient Stated Goals  i want to be able to have full mobility.  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  No/denies         [facility] PT Assessment - 09/28/17 0001      AROM   Left Shoulder ABduction  172 Degrees           Quick Dash - 09/28/17 0001    Open a tight or new jar  No difficulty    Do heavy household chores (wash walls, wash floors)  Mild difficulty    Carry a shopping bag or briefcase  Mild difficulty    Wash your back  Mild difficulty    Use a knife to cut food  No difficulty    Recreational activities in which you take some force or impact through your arm, shoulder, or hand (golf, hammering, tennis)  Mild difficulty    During the past week, to what extent has your arm, shoulder or hand  problem interfered with your normal social activities with family, friends, neighbors, or groups?  Slightly    During the past week, to what extent has your arm, shoulder or hand problem limited your work or other regular daily activities  Not at all    Arm, shoulder, or hand pain.  None    Tingling (pins and needles) in your arm, shoulder, or hand  Mild    Difficulty Sleeping  No difficulty    DASH Score  13.64 %            [facility] Adult PT Treatment/Exercise - 09/28/17 0001      Self-Care   Self-Care  Other Self-Care Comments    Other Self-Care Comments   Educated pt in lymphedema risk reduction and infection prevention answering her questions. Issued handout as well.       Lumbar Exercises: Aerobic   Tread Mill  1.5 mph at 1% grade for 11 minutes while discussing other aspects of self care.              PT Education - 09/28/17 1703    Education  provided  Yes    Education Details  lymphedema risk reduction    Person(s) Educated  Patient    Methods  Explanation;Handout    Comprehension  Verbalized understanding        Short Term Clinic Goals - 09/13/17 1832      CC Short Term Goal  #1   Title  Patient with verbalize an understanding of lymphedema risk reduction precautions    Baseline  signed up to attend ABC class    Time  4    Status  On-going      CC Short Term Goal  #2   Title  Patient will improve left shoulder abduction to 120 degrees so that she can achieve position needed for radiation therapy.    Status  Achieved      CC Short Term Goal  #3   Title  Pt will be independent in home exercise program for left shoulder range of motion     Time  4    Period  Weeks    Status  On-going          Long Term Clinic Goals - 09/28/17 1643      CC Long Term Goal  #2   Title  Pt will be independent in home program for upper body strength to decrease lymphedema risk and prepare for return to work     Status  Achieved      CC Long Term Goal  #3   Title  Pt will have 150 degrees of left shoulder abduction  so that she can perform overhead activities with ease     Baseline  79; 172 degrees         Plan - 09/28/17 1701    Clinical Impression Statement  Pt did very well on treadmill at increased pace and incline with very minimal exertion after. Educated pt on lymphedema risk reduction and infection prevention answering her questions throughout. Also isued script for compression garment(s) and instructed pt to call [facility] for these. Pt reports feeling very good overall and ready for D/C.     Rehab Potential  Good    Clinical Impairments Affecting Rehab Potential  ongoing radiation     PT Frequency  2x / week    PT Duration  8 weeks    PT Treatment/Interventions  ADLs/Self Care Home Management;Neuromuscular re-education;Scar mobilization;Passive range  of motion;Patient/family education;DME  Instruction;Therapeutic activities;Therapeutic exercise;Manual techniques;Taping    PT Next Visit Plan  D/C this visit.    Consulted and Agree with Plan of Care  Patient       Patient will benefit from skilled therapeutic intervention in order to improve the following deficits and impairments:  Decreased skin integrity, Increased edema, Decreased scar mobility, Decreased knowledge of precautions, Decreased activity tolerance, Decreased strength, Impaired UE functional use, Pain, Increased muscle spasms, Decreased range of motion, Postural dysfunction  Visit Diagnosis: Aftercare following surgery for neoplasm  Abnormal posture  Muscle weakness (generalized)  Stiffness of left shoulder joint     Problem List Patient Active Problem List   Diagnosis Date Noted  . Cystitis 05/06/2017  . Hematuria 05/05/2017  . Genetic testing 04/29/2017  . Family history of breast cancer   . Family history of prostate cancer   . Port catheter in place 04/05/2017  . Malignant neoplasm of lower-outer quadrant of left breast of female, estrogen receptor positive ([place]) 03/03/2017    Otelia Limes, PTA 09/28/2017, 5:04 PM  [place], [place], [address] Phone: [phone]   Fax:  [phone]  Name: MIYA LUVIANO MRN: [id number] Date of Birth: July 14, 1961  PHYSICAL THERAPY DISCHARGE SUMMARY  Visits from Start of Care: 4  Current functional level related to goals / functional outcomes: See above, met all ROM goals except for Quick Dash goal   Remaining deficits: See above   Education / Equipment: HEP Plan: Patient agrees to discharge.  Patient goals were not met. Patient is being discharged due to meeting the stated rehab goals.  ?????     Allyson Sabal [place], [place] 09/28/17 5:06 PM

## 2017-09-29 ENCOUNTER — Ambulatory Visit: Payer: BC Managed Care – PPO

## 2017-09-29 ENCOUNTER — Ambulatory Visit
Admission: RE | Admit: 2017-09-29 | Discharge: 2017-09-29 | Disposition: A | Discharge status: Home / Self Care | Payer: BC Managed Care – PPO | Source: Ambulatory Visit | Attending: Radiation Oncology | Admitting: Radiation Oncology

## 2017-09-29 DIAGNOSIS — Z510 Encounter for antineoplastic radiation therapy: Secondary | ICD-10-CM | POA: Diagnosis not present

## 2017-09-30 ENCOUNTER — Ambulatory Visit: Payer: BC Managed Care – PPO

## 2017-09-30 ENCOUNTER — Ambulatory Visit
Admission: RE | Admit: 2017-09-30 | Discharge: 2017-09-30 | Disposition: A | Discharge status: Home / Self Care | Payer: BC Managed Care – PPO | Source: Ambulatory Visit | Attending: Radiation Oncology | Admitting: Radiation Oncology

## 2017-09-30 DIAGNOSIS — Z510 Encounter for antineoplastic radiation therapy: Secondary | ICD-10-CM | POA: Diagnosis not present

## 2017-10-03 ENCOUNTER — Ambulatory Visit: Payer: BC Managed Care – PPO

## 2017-10-03 ENCOUNTER — Ambulatory Visit
Admission: RE | Admit: 2017-10-03 | Discharge: 2017-10-03 | Disposition: A | Discharge status: Home / Self Care | Payer: BC Managed Care – PPO | Source: Ambulatory Visit | Attending: Radiation Oncology | Admitting: Radiation Oncology

## 2017-10-03 ENCOUNTER — Encounter: Payer: Self-pay | Admitting: Radiation Oncology

## 2017-10-03 DIAGNOSIS — C50512 Malignant neoplasm of lower-outer quadrant of left female breast: Secondary | ICD-10-CM

## 2017-10-03 DIAGNOSIS — Z170 Estrogen receptor positive status [ER+]: Principal | ICD-10-CM

## 2017-10-03 DIAGNOSIS — Z510 Encounter for antineoplastic radiation therapy: Secondary | ICD-10-CM | POA: Diagnosis not present

## 2017-10-03 MED ORDER — RADIAPLEXRX EX GEL
Freq: Once | CUTANEOUS | Status: AC
  Administered 2017-10-03: 14:00:00 via TOPICAL

## 2017-10-03 NOTE — Progress Notes (Signed)
  Radiation Oncology         [phone] ________________________________  Name: Haley Sanchez MRN: [id number]  Date: 10/03/2017  DOB: 07/28/1961  End of Treatment Note  Diagnosis:   Left breast cancer, (ypT1c, pN0, cM0, G2, ER: Positive, PR: Positive, HER2: Negative)   Indication for treatment:  Curative       Radiation treatment dates:   08/29/2017-10/03/2017  Site/dose:   1. Left breast, 2.67 Gy x 15 fractions for a total dose of 40.05 Gy           2. Boost, 2 Gy x 5 fractions to 10 Gy  Beams/energy:   1. 3D, 10X         2. Electron, 15E  Narrative: The patient tolerated radiation treatment relatively well. At the beginning of treatment, pt reported mild fatigue. Pt denied left breast pain or skin changes at the beginning of treatment. Towards the end of treatment, pt reported mild fatigue. Pt developed radiation related skin changes, including skin redness to the treatment site. She remained without left breast pain throughout treatment.   Plan: The patient has completed radiation treatment. The patient will return to radiation oncology clinic for routine followup in one month. I advised them to call or return sooner if they have any questions or concerns related to their recovery or treatment.  -----------------------------------  Eppie Gibson, MD  This document serves as a record of services personally performed by Eppie Gibson, MD. It was created on her behalf by Steva Colder, a trained medical scribe. The creation of this record is based on the scribe's personal observations and the provider's statements to them. This document has been checked and approved by the attending provider.

## 2017-10-04 ENCOUNTER — Ambulatory Visit: Payer: BC Managed Care – PPO

## 2017-10-06 ENCOUNTER — Encounter (HOSPITAL_COMMUNITY): Payer: Self-pay | Admitting: Internal Medicine

## 2017-10-06 ENCOUNTER — Ambulatory Visit (HOSPITAL_COMMUNITY)
Admission: RE | Admit: 2017-10-06 | Discharge: 2017-10-06 | Disposition: A | Discharge status: Home / Self Care | Payer: BC Managed Care – PPO | Source: Ambulatory Visit | Attending: Cardiology | Admitting: Cardiology

## 2017-10-06 ENCOUNTER — Ambulatory Visit (HOSPITAL_BASED_OUTPATIENT_CLINIC_OR_DEPARTMENT_OTHER)
Admission: RE | Admit: 2017-10-06 | Discharge: 2017-10-06 | Disposition: A | Discharge status: Home / Self Care | Payer: BC Managed Care – PPO | Source: Ambulatory Visit | Attending: Internal Medicine | Admitting: Internal Medicine

## 2017-10-06 ENCOUNTER — Other Ambulatory Visit: Payer: Self-pay

## 2017-10-06 VITALS — BP 156/89 | HR 66 | Wt 197.8 lb

## 2017-10-06 DIAGNOSIS — I420 Dilated cardiomyopathy: Secondary | ICD-10-CM | POA: Insufficient documentation

## 2017-10-06 DIAGNOSIS — F329 Major depressive disorder, single episode, unspecified: Secondary | ICD-10-CM | POA: Diagnosis not present

## 2017-10-06 DIAGNOSIS — Z170 Estrogen receptor positive status [ER+]: Secondary | ICD-10-CM | POA: Insufficient documentation

## 2017-10-06 DIAGNOSIS — Z810 Family history of intellectual disabilities: Secondary | ICD-10-CM | POA: Insufficient documentation

## 2017-10-06 DIAGNOSIS — Z803 Family history of malignant neoplasm of breast: Secondary | ICD-10-CM | POA: Diagnosis not present

## 2017-10-06 DIAGNOSIS — Z808 Family history of malignant neoplasm of other organs or systems: Secondary | ICD-10-CM | POA: Diagnosis not present

## 2017-10-06 DIAGNOSIS — C50512 Malignant neoplasm of lower-outer quadrant of left female breast: Secondary | ICD-10-CM | POA: Insufficient documentation

## 2017-10-06 DIAGNOSIS — Z9889 Other specified postprocedural states: Secondary | ICD-10-CM | POA: Diagnosis not present

## 2017-10-06 DIAGNOSIS — I061 Rheumatic aortic insufficiency: Secondary | ICD-10-CM | POA: Diagnosis not present

## 2017-10-06 DIAGNOSIS — I10 Essential (primary) hypertension: Secondary | ICD-10-CM

## 2017-10-06 DIAGNOSIS — F419 Anxiety disorder, unspecified: Secondary | ICD-10-CM | POA: Diagnosis not present

## 2017-10-06 DIAGNOSIS — Z841 Family history of disorders of kidney and ureter: Secondary | ICD-10-CM | POA: Insufficient documentation

## 2017-10-06 DIAGNOSIS — J45909 Unspecified asthma, uncomplicated: Secondary | ICD-10-CM | POA: Insufficient documentation

## 2017-10-06 DIAGNOSIS — Z833 Family history of diabetes mellitus: Secondary | ICD-10-CM | POA: Diagnosis not present

## 2017-10-06 DIAGNOSIS — Z8249 Family history of ischemic heart disease and other diseases of the circulatory system: Secondary | ICD-10-CM | POA: Insufficient documentation

## 2017-10-06 DIAGNOSIS — Z8042 Family history of malignant neoplasm of prostate: Secondary | ICD-10-CM | POA: Diagnosis not present

## 2017-10-06 DIAGNOSIS — Z1501 Genetic susceptibility to malignant neoplasm of breast: Secondary | ICD-10-CM | POA: Diagnosis not present

## 2017-10-06 DIAGNOSIS — Z79899 Other long term (current) drug therapy: Secondary | ICD-10-CM | POA: Insufficient documentation

## 2017-10-06 DIAGNOSIS — Z811 Family history of alcohol abuse and dependence: Secondary | ICD-10-CM | POA: Diagnosis not present

## 2017-10-06 NOTE — Progress Notes (Signed)
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 Lindi Adie  for enrollment into the Cardio-Oncology program.  SUMMARY OF ONCOLOGIC HISTORY:       Malignant neoplasm of lower-outer quadrant of left breast of female, estrogen receptor positive ([place])   03/01/2017 Initial Diagnosis    Left breast 2:30 position: 2.1 cm irregular solid mass, additional 0.8 cm oval mass in the left breast (fibrocystic); primary mass on biopsy revealed IDC grade 3, ER 100%, PR 80%, Ki-67 25%, HER-2 positive ratio 1.88, copy #6.2, T2 N0 stage II a clinical stage        Therapy plan 1. Neoadjuvant Taxol Herceptin weekly 12 followed by Herceptin maintenance for 6 months 2. breast conserving surgery with sentinel lymph node biopsy 3. Adjuvant radiation therapy 4. Followed by adjuvant antiestrogen therapy   Here for f/u. Has finished Taxol. Recently finished XRT. Doing well. Tolerating Herceptin well. No edema, orthopnea, PND or DOE. SBP still running high despite addition of spiro 12.5 daily.   ECHO (03/18/17): 60-65%  Lat s' 9.6, -18.3% Grade II DD - Personally reviewed ECHO (07/06/17): 60%  GLS -19.8% LS 11.3cm/s grade I DD  ECHO Today: EF 55-60% LS 11.5 GLS -16.0 (underestimated)    Past Medical History:  Diagnosis Date  . Anxiety   . Asthma   . Cancer [facility])    breast cancer  . Complication of anesthesia   . Depression   . Family history of breast cancer   . Family history of prostate cancer   . Hypertension   . PONV (postoperative nausea and vomiting)   . Seasonal allergies     Current Outpatient Medications  Medication Sig Dispense Refill  . albuterol (PROVENTIL HFA;VENTOLIN HFA) 108 (90 Base) MCG/ACT inhaler Inhale 2 puffs into the lungs every 6 (six) hours as needed for wheezing or shortness of breath.    Marland Kitchen buPROPion (WELLBUTRIN XL)  300 MG 24 hr tablet Take 300 mg by mouth daily.    Marland Kitchen doxycycline (ORACEA) 40 MG capsule Take 40 mg every morning by mouth.    . lidocaine-prilocaine (EMLA) cream     . losartan (COZAAR) 100 MG tablet Take 100 mg by mouth daily.   0  . ondansetron (ZOFRAN) 8 MG tablet Take by mouth.    . oxyCODONE (OXY IR/ROXICODONE) 5 MG immediate release tablet Take 1 tablet (5 mg total) by mouth every 6 (six) hours as needed for moderate pain, severe pain or breakthrough pain. 15 tablet 0  . PARoxetine (PAXIL) 40 MG tablet Take 40 mg by mouth every evening.     . prochlorperazine (COMPAZINE) 10 MG tablet Take by mouth.    . spironolactone (ALDACTONE) 25 MG tablet Take 0.5 tablets (12.5 mg total) daily by mouth. 90 tablet 3  . triamcinolone ointment (KENALOG) 0.5 % Apply 1 application topically 2 (two) times daily. Apply topically as needed 30 g 0   No current facility-administered medications for this encounter.     Allergies  Allergen Reactions  . No Known Allergies       Social History   Socioeconomic History  . Marital status: Divorced    Spouse name: Not on file  . Number of children: Not on file  . Years of education: Not on file  . Highest education level: Not on file  Social Needs  . Financial resource strain: Not on file  .  Food insecurity - worry: Not on file  . Food insecurity - inability: Not on file  . Transportation needs - medical: Not on file  . Transportation needs - non-medical: Not on file  Occupational History  . Not on file  Tobacco Use  . Smoking status: Never Smoker  . Smokeless tobacco: Never Used  Substance and Sexual Activity  . Alcohol use: No    Comment: rarely  . Drug use: Yes    Types: Marijuana    Comment: last use 03/25/17  . Sexual activity: Not Currently  Other Topics Concern  . Not on file  Social History Narrative  . Not on file      Family History  Problem Relation Age of Onset  . Heart disease Mother   . Hypertension Mother   . Kidney  failure Father   . Diabetes Father   . Hypertension Father   . Prostate cancer Father 64  . Brain cancer Maternal Grandmother 42  . Prostate cancer Maternal Grandfather        dx in his 8s  . Breast cancer Paternal Grandmother   . Alcohol abuse Maternal Aunt   . Other Maternal Uncle        died in [place]  . Breast cancer Cousin        maternal first cousin dx in her 68s  . Mental retardation Cousin   . Breast cancer Other        maternal second cousin  . Breast cancer Cousin        paternal first cousin  . Breast cancer Cousin        paternal first cousin    Vitals:   10/06/17 1210  BP: (!) 156/89  Pulse: 66  SpO2: 100%  Weight: 197 lb 12 oz (89.7 kg)    PHYSICAL EXAM: General:  Well appearing. No resp difficulty HEENT: normal Neck: supple. no JVD. Carotids 2+ bilat; no bruits. No lymphadenopathy or thryomegaly appreciated. Cor: PMI nondisplaced. Regular rate & rhythm. No rubs, gallops or murmurs. Lungs: clear Abdomen: soft, nontender, nondistended. No hepatosplenomegaly. No bruits or masses. Good bowel sounds. Extremities: no cyanosis, clubbing, rash, edema Neuro: alert & orientedx3, cranial nerves grossly intact. moves all 4 extremities w/o difficulty. Affect pleasant    ASSESSMENT & PLAN: 1. Left breast cancer -Diagnosed 5/18 -Grade 3, ER 100%, PR 80%, Ki-67 25%, HER-2 positive ratio 1.88, copy #6.2, T2 N0 stage II a clinical stage - Tolerating Herceptin well. EF looks ok but perhaps slightly lower than previous but still in normal range. LS' completely stable so doubt Herceptin toxicity. Repeat echo in 2 months for safety's sake.   2. HTN - BP remains elevated. Increase spiro to 25 daily. Follow labs at [place] weight down with diet and exercise  - If BP stays up can consider sleep study and adding another agent   Glori Bickers, MD  12:42 PM

## 2017-10-06 NOTE — Patient Instructions (Signed)
Your physician recommends that you schedule a follow-up appointment in: 2 months with echocardiogram  

## 2017-10-06 NOTE — Progress Notes (Signed)
  Echocardiogram 2D Echocardiogram has been performed.  Elmer Ramp 10/06/2017, 12:43 PM

## 2017-10-07 ENCOUNTER — Ambulatory Visit (HOSPITAL_BASED_OUTPATIENT_CLINIC_OR_DEPARTMENT_OTHER): Payer: BC Managed Care – PPO

## 2017-10-07 VITALS — BP 154/91 | HR 73 | Temp 98.4°F | Resp 17

## 2017-10-07 DIAGNOSIS — Z170 Estrogen receptor positive status [ER+]: Principal | ICD-10-CM

## 2017-10-07 DIAGNOSIS — C50512 Malignant neoplasm of lower-outer quadrant of left female breast: Secondary | ICD-10-CM

## 2017-10-07 DIAGNOSIS — Z5112 Encounter for antineoplastic immunotherapy: Secondary | ICD-10-CM | POA: Diagnosis not present

## 2017-10-07 MED ORDER — SODIUM CHLORIDE 0.9 % IV SOLN
Freq: Once | INTRAVENOUS | Status: AC
  Administered 2017-10-07: 08:00:00 via INTRAVENOUS

## 2017-10-07 MED ORDER — HEPARIN SOD (PORK) LOCK FLUSH 100 UNIT/ML IV SOLN
500.0000 [IU] | Freq: Once | INTRAVENOUS | Status: AC | PRN
  Administered 2017-10-07: 500 [IU]
  Filled 2017-10-07: qty 5

## 2017-10-07 MED ORDER — DIPHENHYDRAMINE HCL 25 MG PO CAPS
50.0000 mg | ORAL_CAPSULE | Freq: Once | ORAL | Status: AC
  Administered 2017-10-07: 50 mg via ORAL

## 2017-10-07 MED ORDER — DIPHENHYDRAMINE HCL 25 MG PO CAPS
ORAL_CAPSULE | ORAL | Status: AC
  Filled 2017-10-07: qty 2

## 2017-10-07 MED ORDER — ACETAMINOPHEN 325 MG PO TABS
650.0000 mg | ORAL_TABLET | Freq: Once | ORAL | Status: AC
  Administered 2017-10-07: 650 mg via ORAL

## 2017-10-07 MED ORDER — TRASTUZUMAB CHEMO 150 MG IV SOLR
6.0000 mg/kg | Freq: Once | INTRAVENOUS | Status: AC
  Administered 2017-10-07: 504 mg via INTRAVENOUS
  Filled 2017-10-07: qty 24

## 2017-10-07 MED ORDER — ACETAMINOPHEN 325 MG PO TABS
ORAL_TABLET | ORAL | Status: AC
  Filled 2017-10-07: qty 2

## 2017-10-07 MED ORDER — SODIUM CHLORIDE 0.9% FLUSH
10.0000 mL | INTRAVENOUS | Status: DC | PRN
  Administered 2017-10-07: 10 mL
  Filled 2017-10-07: qty 10

## 2017-10-07 NOTE — Patient Instructions (Signed)
[facility] Discharge Instructions for Patients Receiving Chemotherapy  Today you received the following chemotherapy agents Herceptin  To help prevent nausea and vomiting after your treatment, we encourage you to take your nausea medication as directed   If you develop nausea and vomiting that is not controlled by your nausea medication, call the clinic.   BELOW ARE SYMPTOMS THAT SHOULD BE REPORTED IMMEDIATELY:  *FEVER GREATER THAN 100.5 F  *CHILLS WITH OR WITHOUT FEVER  NAUSEA AND VOMITING THAT IS NOT CONTROLLED WITH YOUR NAUSEA MEDICATION  *UNUSUAL SHORTNESS OF BREATH  *UNUSUAL BRUISING OR BLEEDING  TENDERNESS IN MOUTH AND THROAT WITH OR WITHOUT PRESENCE OF ULCERS  *URINARY PROBLEMS  *BOWEL PROBLEMS  UNUSUAL RASH Items with * indicate a potential emergency and should be followed up as soon as possible.  Feel free to call the clinic should you have any questions or concerns. The clinic phone number is [phone].  Please show the CHEMO ALERT CARD at check-in to the Emergency Department and triage nurse.   

## 2017-10-13 ENCOUNTER — Encounter: Payer: Self-pay | Admitting: Hematology and Oncology

## 2017-10-26 ENCOUNTER — Ambulatory Visit: Payer: BC Managed Care – PPO

## 2017-10-26 ENCOUNTER — Other Ambulatory Visit: Payer: BC Managed Care – PPO

## 2017-10-28 ENCOUNTER — Inpatient Hospital Stay: Payer: BC Managed Care – PPO | Attending: Adult Health | Admitting: Adult Health

## 2017-10-28 ENCOUNTER — Telehealth: Payer: Self-pay | Admitting: Adult Health

## 2017-10-28 ENCOUNTER — Encounter: Payer: Self-pay | Admitting: Adult Health

## 2017-10-28 ENCOUNTER — Inpatient Hospital Stay: Payer: BC Managed Care – PPO

## 2017-10-28 ENCOUNTER — Encounter: Payer: Self-pay | Admitting: *Deleted

## 2017-10-28 VITALS — BP 131/92 | HR 103 | Temp 98.0°F | Resp 16 | Ht 66.0 in | Wt 197.3 lb

## 2017-10-28 DIAGNOSIS — Z170 Estrogen receptor positive status [ER+]: Secondary | ICD-10-CM

## 2017-10-28 DIAGNOSIS — Z5112 Encounter for antineoplastic immunotherapy: Secondary | ICD-10-CM | POA: Diagnosis present

## 2017-10-28 DIAGNOSIS — Z79811 Long term (current) use of aromatase inhibitors: Secondary | ICD-10-CM | POA: Insufficient documentation

## 2017-10-28 DIAGNOSIS — C50512 Malignant neoplasm of lower-outer quadrant of left female breast: Secondary | ICD-10-CM

## 2017-10-28 DIAGNOSIS — Z23 Encounter for immunization: Secondary | ICD-10-CM | POA: Diagnosis not present

## 2017-10-28 DIAGNOSIS — E2839 Other primary ovarian failure: Secondary | ICD-10-CM

## 2017-10-28 DIAGNOSIS — Z95828 Presence of other vascular implants and grafts: Secondary | ICD-10-CM

## 2017-10-28 LAB — CBC WITH DIFFERENTIAL/PLATELET
BASOS ABS: 0 10*3/uL (ref 0.0–0.1)
Basophils Relative: 1 %
Eosinophils Absolute: 0.3 10*3/uL (ref 0.0–0.5)
Eosinophils Relative: 8 %
HEMATOCRIT: 36.4 % (ref 34.8–46.6)
HEMOGLOBIN: 12.1 g/dL (ref 11.6–15.9)
Lymphocytes Relative: 30 %
Lymphs Abs: 1 10*3/uL (ref 0.9–3.3)
MCH: 30.3 pg (ref 25.1–34.0)
MCHC: 33.2 g/dL (ref 31.5–36.0)
MCV: 91.2 fL (ref 79.5–101.0)
Monocytes Absolute: 0.3 10*3/uL (ref 0.1–0.9)
Monocytes Relative: 9 %
NEUTROS ABS: 1.8 10*3/uL (ref 1.5–6.5)
NEUTROS PCT: 52 %
Platelets: 196 10*3/uL (ref 145–400)
RBC: 3.99 MIL/uL (ref 3.70–5.45)
RDW: 13.1 % (ref 11.2–16.1)
WBC: 3.5 10*3/uL — AB (ref 3.9–10.3)

## 2017-10-28 LAB — COMPREHENSIVE METABOLIC PANEL
ALK PHOS: 111 U/L (ref 40–150)
ALT: 43 U/L (ref 0–55)
AST: 24 U/L (ref 5–34)
Albumin: 3.3 g/dL — ABNORMAL LOW (ref 3.5–5.0)
Anion gap: 10 (ref 3–11)
BILIRUBIN TOTAL: 0.3 mg/dL (ref 0.2–1.2)
BUN: 14 mg/dL (ref 7–26)
CO2: 28 mmol/L (ref 22–29)
Calcium: 8.9 mg/dL (ref 8.4–10.4)
Chloride: 106 mmol/L (ref 98–109)
Creatinine, Ser: 1.15 mg/dL — ABNORMAL HIGH (ref 0.60–1.10)
GFR calc Af Amer: >60 mL/min (ref >60)
GFR calc non Af Amer: 52 mL/min — ABNORMAL LOW (ref >60)
GLUCOSE: 100 mg/dL (ref 70–140)
POTASSIUM: 3.7 mmol/L (ref 3.3–4.7)
Sodium: 144 mmol/L (ref 136–145)
TOTAL PROTEIN: 6.2 g/dL — AB (ref 6.4–8.3)

## 2017-10-28 MED ORDER — SODIUM CHLORIDE 0.9% FLUSH
10.0000 mL | Freq: Once | INTRAVENOUS | Status: AC
  Administered 2017-10-28: 10 mL
  Filled 2017-10-28: qty 10

## 2017-10-28 MED ORDER — LETROZOLE 2.5 MG PO TABS: 2.5000 mg | ORAL_TABLET | Freq: Every day | ORAL | 2 refills | Status: DC

## 2017-10-28 MED ORDER — ACETAMINOPHEN 325 MG PO TABS
650.0000 mg | ORAL_TABLET | Freq: Once | ORAL | Status: AC
  Administered 2017-10-28: 650 mg via ORAL

## 2017-10-28 MED ORDER — HEPARIN SOD (PORK) LOCK FLUSH 100 UNIT/ML IV SOLN
500.0000 [IU] | Freq: Once | INTRAVENOUS | Status: AC | PRN
  Administered 2017-10-28: 500 [IU]
  Filled 2017-10-28: qty 5

## 2017-10-28 MED ORDER — SODIUM CHLORIDE 0.9% FLUSH
10.0000 mL | INTRAVENOUS | Status: DC | PRN
  Filled 2017-10-28: qty 10

## 2017-10-28 MED ORDER — ACETAMINOPHEN 325 MG PO TABS
ORAL_TABLET | ORAL | Status: AC
  Filled 2017-10-28: qty 2

## 2017-10-28 MED ORDER — TRASTUZUMAB CHEMO 150 MG IV SOLR
6.0000 mg/kg | Freq: Once | INTRAVENOUS | Status: AC
  Administered 2017-10-28: 504 mg via INTRAVENOUS
  Filled 2017-10-28: qty 24

## 2017-10-28 MED ORDER — DIPHENHYDRAMINE HCL 25 MG PO CAPS
50.0000 mg | ORAL_CAPSULE | Freq: Once | ORAL | Status: AC
  Administered 2017-10-28: 50 mg via ORAL

## 2017-10-28 MED ORDER — INFLUENZA VAC SPLIT QUAD 0.5 ML IM SUSY
0.5000 mL | PREFILLED_SYRINGE | Freq: Once | INTRAMUSCULAR | Status: AC
  Administered 2017-10-28: 0.5 mL via INTRAMUSCULAR
  Filled 2017-10-28: qty 0.5

## 2017-10-28 MED ORDER — DIPHENHYDRAMINE HCL 25 MG PO CAPS
ORAL_CAPSULE | ORAL | Status: AC
  Filled 2017-10-28: qty 2

## 2017-10-28 MED ORDER — SODIUM CHLORIDE 0.9 % IV SOLN
Freq: Once | INTRAVENOUS | Status: AC
  Administered 2017-10-28: 10:00:00 via INTRAVENOUS

## 2017-10-28 NOTE — Progress Notes (Addendum)
[facility] Follow up:    Haley Melter, MD [address] [place] [address]   DIAGNOSIS: Cancer Staging Malignant neoplasm of lower-outer quadrant of left breast of female, estrogen receptor positive ([place]) Staging form: Breast, AJCC 8th Edition - Clinical stage from 03/09/2017: Stage IB (cT2, cN0, cM0, G3, ER: Positive, PR: Positive, HER2: Positive) - Unsigned Staging comments: Staged at breast conference on 5.30.18 - Pathologic: No Stage Recommended (ypT1c, pN0, cM0, G2, ER: Positive, PR: Positive, HER2: Negative) - Signed by Gardenia Phlegm, NP on 08/03/2017   SUMMARY OF ONCOLOGIC HISTORY:   Malignant neoplasm of lower-outer quadrant of left breast of female, estrogen receptor positive ([place])   03/01/2017 Initial Diagnosis    Left breast 2:30 position: 2.1 cm irregular solid mass, additional 0.8 cm oval mass in the left breast (fibrocystic); primary mass on biopsy revealed IDC grade 3, ER 100%, PR 80%, Ki-67 25%, HER-2 positive ratio 1.88, copy #6.2, T2 N0 stage II a clinical stage      03/29/2017 - 06/14/2017 Neo-Adjuvant Chemotherapy    Neoadjuvant chemotherapy with 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  The Multi-Gene Panel offered by [facility] includes sequencing and/or deletion duplication testing of the following 80 genes: ALK, APC, ATM, AXIN2,BAP1,  BARD1, BLM, BMPR1A, BRCA1, BRCA2, BRIP1, CASR, CDC73, CDH1, CDK4, CDKN1B, CDKN1C, CDKN2A (p14ARF), CDKN2A (p16INK4a), CEBPA, CHEK2, CTNNA1, DICER1, DIS3L2, EGFR (c.2369C>T, p.Thr790Met variant only), EPCAM (Deletion/duplication testing only), FH, FLCN, GATA2, GPC3, GREM1 (Promoter region deletion/duplication testing only), HOXB13 (c.251G>A, p.Gly84Glu), HRAS, KIT, MAX, MEN1, MET, MITF (c.952G>A, p.Glu318Lys variant only), MLH1, MSH2, MSH3, MSH6, MUTYH, NBN, NF1, NF2, NTHL1, PALB2, PDGFRA, PHOX2B, PMS2,  POLD1, POLE, POT1, PRKAR1A, PTCH1, PTEN, RAD50, RAD51C, RAD51D, RB1, RECQL4, RET, RUNX1, SDHAF2, SDHA (sequence changes only), SDHB, SDHC, SDHD, SMAD4, SMARCA4, SMARCB1, SMARCE1, STK11, SUFU, TERT, TERT, TMEM127, TP53, TSC1, TSC2, VHL, WRN and WT1.  The report date is April 27, 2017.       07/21/2017 Surgery    Left lumpectomy: Grade 2 IDC 1.9 cm with DCIS, margins negative, 0/4 lymph nodes negative, ER 100%, PR 80%, HER-2 positive, Ki-67 25%, RCB class II,T1c N0 stage IA      08/29/2017 - 10/03/2017 Radiation Therapy    Adjuvant radiation:1. Left breast, 2.67 Gy in 15 fractions for a total dose of 40.05 Gy  2. Boost, 2 Gy in 5 fractions for a total dose of 10 Gy          CURRENT THERAPY: Herceptin, to start Letrozole  INTERVAL HISTORY: Haley Sanchez 57 y.o. female returns for evaluation prior to receiving Herceptin today.  She completed radiation without difficulty in December.  She saw Dr. Haroldine Laws in December and he noted a slight dip in her ejection fraction.  She has f/u with him in 11/2017 for repeat echo.  She was cleared to continue Herceptin in the meantime.  She noted after her last Herceptin infusion that she experienced back pain and bone pain for a few days following.  She describes this as very intense.  She messaged into [place] and was told to take Claritin for this pain and it helped.  She is concerned that if it happened again, that she would like to switch to [place].   Patient Active Problem List   Diagnosis Date Noted  . Cystitis 05/06/2017  . Hematuria 05/05/2017  . Genetic testing 04/29/2017  .  Family history of breast cancer   . Family history of prostate cancer   . Port catheter in place 04/05/2017  . 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  MEDICAL HISTORY: Past Medical History:  Diagnosis Date  . Anxiety   . Asthma   . Cancer [facility])    breast  cancer  . Complication of anesthesia   . Depression   . Family history of breast cancer   . Family history of prostate cancer   . Hypertension   . PONV (postoperative nausea and vomiting)   . Seasonal allergies     SURGICAL HISTORY: Past Surgical History:  Procedure Laterality Date  . BREAST LUMPECTOMY WITH RADIOACTIVE SEED AND SENTINEL LYMPH NODE BIOPSY Left 07/21/2017   Procedure: BREAST LUMPECTOMY WITH RADIOACTIVE SEED AND SENTINEL LYMPH NODE BIOPSY;  Surgeon: Rolm Bookbinder, MD;  Location: [facility];  Service: General;  Laterality: Left;  . PORTACATH PLACEMENT Right 03/28/2017   Procedure: INSERTION PORT-A-CATH WITH [place];  Surgeon: Rolm Bookbinder, MD;  Location: [facility];  Service: General;  Laterality: Right;    SOCIAL HISTORY: Social History   Socioeconomic History  . Marital status: Divorced    Spouse name: Not on file  . Number of children: Not on file  . Years of education: Not on file  . Highest education level: Not on file  Social Needs  . Financial resource strain: Not on file  . Food insecurity - worry: Not on file  . Food insecurity - inability: Not on file  . Transportation needs - medical: Not on file  . Transportation needs - non-medical: Not on file  Occupational History  . Not on file  Tobacco Use  . Smoking status: Never Smoker  . Smokeless tobacco: Never Used  Substance and Sexual Activity  . Alcohol use: No    Comment: rarely  . Drug use: Yes    Types: Marijuana    Comment: last use 03/25/17  . Sexual activity: Not Currently  Other Topics Concern  . Not on file  Social History Narrative  . Not on file    FAMILY HISTORY: Family History  Problem Relation Age of Onset  . Heart disease Mother   . Hypertension Mother   . Kidney failure Father   . Diabetes Father   . Hypertension Father   . Prostate cancer Father 70  . Brain cancer Maternal Grandmother 89  . Prostate cancer Maternal Grandfather        dx in his 44s  .  Breast cancer Paternal Grandmother   . Alcohol abuse Maternal Aunt   . Other Maternal Uncle        died in [place]  . Breast cancer Cousin        maternal first cousin dx in her 36s  . Mental retardation Cousin   . Breast cancer Other        maternal second cousin  . Breast cancer Cousin        paternal first cousin  . Breast cancer Cousin        paternal first cousin    Review of Systems  Constitutional: Negative for appetite change, chills, fatigue, fever and unexpected weight change.  HENT:   Negative for hearing loss, lump/mass, sore throat and trouble swallowing.   Eyes: Negative for eye problems and icterus.  Respiratory: Negative for chest tightness, cough and shortness of breath.  Cardiovascular: Negative for chest pain, leg swelling and palpitations.  Gastrointestinal: Negative for abdominal distention, abdominal pain, constipation, diarrhea, nausea and vomiting.  Endocrine: Negative for hot flashes.  Genitourinary: Negative for difficulty urinating.   Musculoskeletal: Negative for arthralgias.  Skin: Negative for itching and rash.  Neurological: Negative for dizziness, extremity weakness, headaches, numbness and seizures.  Hematological: Negative for adenopathy. Does not bruise/bleed easily.  Psychiatric/Behavioral: Negative for depression. The patient is not nervous/anxious.       PHYSICAL EXAMINATION  ECOG PERFORMANCE STATUS: 1 - Symptomatic but completely ambulatory  Vitals:   10/28/17 0859  BP: (!) 131/92  Pulse: (!) 103  Resp: 16  Temp: 98 F (36.7 C)  SpO2: 98%    Physical Exam  Constitutional: She is oriented to person, place, and time and well-developed, well-nourished, and in no distress.  HENT:  Head: Normocephalic and atraumatic.  Mouth/Throat: Oropharynx is clear and moist. No oropharyngeal exudate.  Eyes: Pupils are equal, round, and reactive to light. No scleral icterus.  Neck: Neck supple.  Cardiovascular: Normal rate, regular rhythm and  normal heart sounds.  Pulmonary/Chest: Effort normal and breath sounds normal. No respiratory distress. She has no wheezes. She has no rales.  Abdominal: Soft. Bowel sounds are normal. She exhibits no distension and no mass. There is no tenderness. There is no rebound and no guarding.  Musculoskeletal: She exhibits no edema.  Lymphadenopathy:    She has no cervical adenopathy.  Neurological: She is alert and oriented to person, place, and time.  Skin: Skin is warm and dry. No rash noted.  Psychiatric: Mood and affect normal.    LABORATORY DATA:  CBC    Component Value Date/Time   WBC 3.5 (L) 10/28/2017 0816   RBC 3.99 10/28/2017 0816   HGB 12.1 10/28/2017 0816   HGB 13.4 09/16/2017 1314   HCT 36.4 10/28/2017 0816   HCT 39.5 09/16/2017 1314   PLT 196 10/28/2017 0816   PLT 236 09/16/2017 1314   MCV 91.2 10/28/2017 0816   MCV 91.0 09/16/2017 1314   MCH 30.3 10/28/2017 0816   MCHC 33.2 10/28/2017 0816   RDW 13.1 10/28/2017 0816   RDW 12.9 09/16/2017 1314   LYMPHSABS 1.0 10/28/2017 0816   LYMPHSABS 0.9 09/16/2017 1314   MONOABS 0.3 10/28/2017 0816   MONOABS 0.8 09/16/2017 1314   EOSABS 0.3 10/28/2017 0816   EOSABS 0.0 09/16/2017 1314   BASOSABS 0.0 10/28/2017 0816   BASOSABS 0.0 09/16/2017 1314    CMP     Component Value Date/Time   NA 144 10/28/2017 0816   NA 139 09/16/2017 1314   K 3.7 10/28/2017 0816   K 4.0 09/16/2017 1314   CL 106 10/28/2017 0816   CO2 28 10/28/2017 0816   CO2 22 09/16/2017 1314   GLUCOSE 100 10/28/2017 0816   GLUCOSE 84 09/16/2017 1314   BUN 14 10/28/2017 0816   BUN 22.5 09/16/2017 1314   CREATININE 1.15 (H) 10/28/2017 0816   CREATININE 1.2 (H) 09/16/2017 1314   CALCIUM 8.9 10/28/2017 0816   CALCIUM 8.5 09/16/2017 1314   PROT 6.2 (L) 10/28/2017 0816   PROT 6.3 (L) 09/16/2017 1314   ALBUMIN 3.3 (L) 10/28/2017 0816   ALBUMIN 3.5 09/16/2017 1314   AST 24 10/28/2017 0816   AST 13 09/16/2017 1314   ALT 43 10/28/2017 0816   ALT 16  09/16/2017 1314   ALKPHOS 111 10/28/2017 0816   ALKPHOS 84 09/16/2017 1314   BILITOT 0.3 10/28/2017 0816   BILITOT 0.25 09/16/2017  [place] (L) 10/28/2017 0816   GFRAA >60 10/28/2017 0816    ASSESSMENT and PLAN:   Malignant neoplasm of lower-outer 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 Neoadjuvant Taxol Herceptin weekly 11 completed 09/04/2018followed by Herceptin maintenance for 6 months 2. breast conserving surgery with sentinel lymph node biopsy done on 07/21/2017 07/21/2017 Left lumpectomy: Grade 2 IDC 1.9 cm with DCIS, margins negative, 0/4 lymph nodes negative, ER 100%, PR 80%, HER-2 positive, Ki-67 25%, RCB class II,T1c N0 stage IA  Current treatment: 1. Continue adjuvant Herceptin 2. Adjuvant radiation therapy finished on 10/03/17 3. Followed by adjuvant antiestrogen therapy  Nikita and I reviewed her treatment.  She will proceed with Herceptin today.  She and I discussed Letrozole in detail.  We reviewed risks/benefits.  I sent in a 30 day supply.  I went ahead and ordered a bone density test, and her next mammogram which will be due in 03/2018.   Return to clinic every 3 weeks for Herceptin every 6 weeks for follow-up with Dr. Lindi Adie along with Herceptin.   Orders Placed This Encounter  Procedures  . MM DIAG BREAST TOMO BILATERAL    Standing Status:   Future    Standing Expiration Date:   12/27/2018    Order Specific Question:   Reason for Exam (SYMPTOM  OR DIAGNOSIS REQUIRED)    Answer:   breast cancer    Order Specific Question:   Preferred imaging location?    Answer:   [facility]    Order Specific Question:   Is the patient pregnant?     Answer:   No  . DG Bone Density    Standing Status:   Future    Standing Expiration Date:   10/28/2018    Order Specific Question:   Reason for Exam (SYMPTOM  OR DIAGNOSIS REQUIRED)    Answer:   estrogen deficiency    Order Specific Question:   Preferred imaging location?    Answer:   [facility]    Order Specific Question:   Is the patient pregnant?    Answer:   No    All questions were answered. The patient knows to call the clinic with any problems, questions or concerns. We can certainly see the patient much sooner if necessary.  A total of (30) minutes of face-to-face time was spent with this patient with greater than 50% of that time in counseling and care-coordination.  This note was electronically signed. Scot Dock, NP 10/28/2017

## 2017-10-28 NOTE — Assessment & Plan Note (Addendum)
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 Neoadjuvant Taxol Herceptin weekly 11 completed 09/04/2018followed by Herceptin maintenance for 6 months 2. breast conserving surgery with sentinel lymph node biopsy done on 07/21/2017 07/21/2017 Left lumpectomy: Grade 2 IDC 1.9 cm with DCIS, margins negative, 0/4 lymph nodes negative, ER 100%, PR 80%, HER-2 positive, Ki-67 25%, RCB class II,T1c N0 stage IA  Current treatment: 1. Continue adjuvant Herceptin 2. Adjuvant radiation therapy finished on 10/03/17 3. Followed by adjuvant antiestrogen therapy  Odie and I reviewed her treatment.  She will proceed with Herceptin today.  She and I discussed Letrozole in detail.  We reviewed risks/benefits.  I sent in a 30 day supply.  I went ahead and ordered a bone density test, and her next mammogram which will be due in 03/2018.   Return to clinic every 3 weeks for Herceptin every 6 weeks for follow-up with Dr. Lindi Adie along with Herceptin.

## 2017-10-28 NOTE — Patient Instructions (Signed)
[facility] Discharge Instructions for Patients Receiving Chemotherapy  Today you received the following chemotherapy agents Herceptin  To help prevent nausea and vomiting after your treatment, we encourage you to take your nausea medication as directed   If you develop nausea and vomiting that is not controlled by your nausea medication, call the clinic.   BELOW ARE SYMPTOMS THAT SHOULD BE REPORTED IMMEDIATELY:  *FEVER GREATER THAN 100.5 F  *CHILLS WITH OR WITHOUT FEVER  NAUSEA AND VOMITING THAT IS NOT CONTROLLED WITH YOUR NAUSEA MEDICATION  *UNUSUAL SHORTNESS OF BREATH  *UNUSUAL BRUISING OR BLEEDING  TENDERNESS IN MOUTH AND THROAT WITH OR WITHOUT PRESENCE OF ULCERS  *URINARY PROBLEMS  *BOWEL PROBLEMS  UNUSUAL RASH Items with * indicate a potential emergency and should be followed up as soon as possible.  Feel free to call the clinic should you have any questions or concerns. The clinic phone number is [phone].  Please show the CHEMO ALERT CARD at check-in to the Emergency Department and triage nurse.   

## 2017-10-28 NOTE — Patient Instructions (Signed)

## 2017-10-28 NOTE — Telephone Encounter (Signed)
No 1/18 los.  °

## 2017-11-01 ENCOUNTER — Encounter: Payer: Self-pay | Admitting: Adult Health

## 2017-11-04 ENCOUNTER — Encounter: Payer: Self-pay | Admitting: Radiation Oncology

## 2017-11-07 ENCOUNTER — Other Ambulatory Visit (HOSPITAL_COMMUNITY): Payer: Self-pay

## 2017-11-07 MED ORDER — SPIRONOLACTONE 25 MG PO TABS: 25.0000 mg | ORAL_TABLET | Freq: Every day | ORAL | 3 refills | Status: DC

## 2017-11-09 ENCOUNTER — Encounter: Payer: Self-pay | Admitting: Radiation Oncology

## 2017-11-09 ENCOUNTER — Ambulatory Visit
Admission: RE | Admit: 2017-11-09 | Discharge: 2017-11-09 | Disposition: A | Discharge status: Home / Self Care | Payer: BC Managed Care – PPO | Source: Ambulatory Visit | Attending: Radiation Oncology | Admitting: Radiation Oncology

## 2017-11-09 VITALS — BP 138/68 | HR 86 | Temp 98.7°F | Ht 66.0 in | Wt 202.4 lb

## 2017-11-09 DIAGNOSIS — J029 Acute pharyngitis, unspecified: Secondary | ICD-10-CM | POA: Insufficient documentation

## 2017-11-09 DIAGNOSIS — H9209 Otalgia, unspecified ear: Secondary | ICD-10-CM | POA: Insufficient documentation

## 2017-11-09 DIAGNOSIS — C50512 Malignant neoplasm of lower-outer quadrant of left female breast: Secondary | ICD-10-CM | POA: Diagnosis present

## 2017-11-09 DIAGNOSIS — R6884 Jaw pain: Secondary | ICD-10-CM | POA: Diagnosis not present

## 2017-11-09 DIAGNOSIS — Z923 Personal history of irradiation: Secondary | ICD-10-CM | POA: Insufficient documentation

## 2017-11-09 DIAGNOSIS — Z170 Estrogen receptor positive status [ER+]: Secondary | ICD-10-CM | POA: Insufficient documentation

## 2017-11-09 DIAGNOSIS — R5383 Other fatigue: Secondary | ICD-10-CM | POA: Insufficient documentation

## 2017-11-09 DIAGNOSIS — Z79899 Other long term (current) drug therapy: Secondary | ICD-10-CM | POA: Insufficient documentation

## 2017-11-09 HISTORY — DX: Personal history of irradiation: Z92.3

## 2017-11-09 NOTE — Progress Notes (Signed)
Radiation Oncology         [phone] ________________________________  Name: Haley Sanchez MRN: [id number]  Date: 11/09/2017  DOB: 1961-07-02  Follow-Up Visit Note  Outpatient  CC: Orpah Melter, MD  Nicholas Lose, MD  Diagnosis and Prior Radiotherapy:    ICD-10-CM   1. Malignant neoplasm of lower-outer quadrant of left breast of female, estrogen receptor positive ([place]) C50.512    Z17.0     Left breast cancer, (ypT1c, pN0, cM0, G2, ER: Positive, PR: Positive, HER2: Negative)   Radiation treatment dates:   08/29/2017-10/03/2017  Site/dose:   1. Left breast, 2.67 Gy x 15 fractions for a total dose of 40.05 Gy                      2. Boost, 2 Gy x 5 fractions to 10 Gy  CHIEF COMPLAINT: Here for follow-up and surveillance of left breast cancer  Narrative:  The patient returns today for routine follow-up. She notes that she is doing well overall. She has continued to use Radiaplex. She continues on Herceptin without any acute side effects. She denies starting letrozole as of yet, she was advised to start the medication and she received it 2 weeks ago. She has had to a bone density and she would like to start the letrozole following. She has a follow up appointment with Medical Oncology on 11/18/17. She notes that although, she is not depressed, she feels "blah", but notes that she looks forward to being with her grandchildren.   On review of systems, she reports left jaw pain x 3 days, sore throat, ear pain, and persistent fatigue. She denies being evaluated for this at this time. She denies being on zomeda or any bone strengthening medication. She denies depression and any other symptoms.                               ALLERGIES:  is allergic to no known allergies.  Meds: Current Outpatient Medications  Medication Sig Dispense Refill  . albuterol (PROVENTIL HFA;VENTOLIN HFA) 108 (90 Base) MCG/ACT inhaler Inhale 2 puffs into the lungs every 6 (six) hours as needed for wheezing or  shortness of breath.    Marland Kitchen buPROPion (WELLBUTRIN XL) 300 MG 24 hr tablet Take 300 mg by mouth daily.    Marland Kitchen lidocaine-prilocaine (EMLA) cream     . losartan (COZAAR) 100 MG tablet Take 100 mg by mouth daily.   0  . PARoxetine (PAXIL) 40 MG tablet Take 40 mg by mouth every evening.     Marland Kitchen spironolactone (ALDACTONE) 25 MG tablet Take 1 tablet (25 mg total) by mouth daily. 30 tablet 3  . letrozole (FEMARA) 2.5 MG tablet Take 1 tablet (2.5 mg total) by mouth daily. (Patient not taking: Reported on 11/09/2017) 30 tablet 2  . ondansetron (ZOFRAN) 8 MG tablet Take by mouth.    . prochlorperazine (COMPAZINE) 10 MG tablet Take by mouth.     No current facility-administered medications for this encounter.     Physical Findings: The patient is in no acute distress. Patient is alert and oriented.  height is 5' 6"  (1.676 m) and weight is 202 lb 6.4 oz (91.8 kg). Her temperature is 98.7 F (37.1 C). Her blood pressure is 138/68 and her pulse is 86. Her oxygen saturation is 94%. .    Satisfactory skin healing in radiotherapy fields. Slight erythema over left breast, skin is intact.  Lab Findings: Lab Results  Component Value Date   WBC 3.5 (L) 10/28/2017   HGB 12.1 10/28/2017   HCT 36.4 10/28/2017   MCV 91.2 10/28/2017   PLT 196 10/28/2017    Radiographic Findings: No results found.  Impression/Plan: Healing well from radiotherapy to the breast tissue.  Continue skin care with topical Vitamin E Oil and / or lotion for at least 2 more months for further healing.  She will let her PCP know if her virus-like symptoms do not resolve within 2 weeks.  I encouraged her to continue with yearly mammography as appropriate (for intact breast tissue) and followup with medical oncology. I encouraged the patient to follow up with medical oncology this week by phone regarding the use of letrozole. I will see her back on an as-needed basis. I have encouraged her to call if she has any issues or concerns in the  future. I wished her the very best.   ____________________   Eppie Gibson, MD  This document serves as a record of services personally performed by Eppie Gibson, MD. It was created on her behalf by Steva Colder, a trained medical scribe. The creation of this record is based on the scribe's personal observations and the provider's statements to them. This document has been checked and approved by the attending provider.

## 2017-11-09 NOTE — Progress Notes (Signed)
Ms. Rosello reports for follow up of radiation completed 10/03/17 to her Left Breast. She reports a sore throat and ear pain. She has also had Left jaw pain that started 3 days ago. She continues Herceptin. She has not started taking Letrozole at this time. She has healed well from her radiation to her Left Breast and will begin using vitamin E lotion to this area. She reports continued fatigue, and has not started back to work at this time.   BP 138/68   Pulse 86   Temp 98.7 F (37.1 C)   Ht 5\' 6"  (1.676 m)   Wt 202 lb 6.4 oz (91.8 kg)   SpO2 94% Comment: room air  BMI 32.67 kg/m    Wt Readings from Last 3 Encounters:  11/09/17 202 lb 6.4 oz (91.8 kg)  10/28/17 197 lb 4.8 oz (89.5 kg)  10/06/17 197 lb 12 oz (89.7 kg)

## 2017-11-15 ENCOUNTER — Encounter: Payer: Self-pay | Admitting: Hematology and Oncology

## 2017-11-16 ENCOUNTER — Other Ambulatory Visit: Payer: Self-pay | Admitting: Hematology and Oncology

## 2017-11-17 ENCOUNTER — Telehealth: Payer: Self-pay | Admitting: Hematology and Oncology

## 2017-11-17 NOTE — Telephone Encounter (Signed)
CME - moved 3/1 f/u from [facility] to [facility] per desk nurse. Left message for patient. Patient to get updated schedule tomorrow.

## 2017-11-18 ENCOUNTER — Inpatient Hospital Stay: Payer: BC Managed Care – PPO | Attending: Adult Health

## 2017-11-18 ENCOUNTER — Telehealth: Payer: Self-pay | Admitting: Hematology and Oncology

## 2017-11-18 VITALS — BP 117/72 | HR 86 | Temp 99.1°F | Resp 16

## 2017-11-18 DIAGNOSIS — Z5112 Encounter for antineoplastic immunotherapy: Secondary | ICD-10-CM | POA: Insufficient documentation

## 2017-11-18 DIAGNOSIS — C50512 Malignant neoplasm of lower-outer quadrant of left female breast: Secondary | ICD-10-CM | POA: Diagnosis present

## 2017-11-18 DIAGNOSIS — Z170 Estrogen receptor positive status [ER+]: Secondary | ICD-10-CM

## 2017-11-18 MED ORDER — ACETAMINOPHEN 325 MG PO TABS
ORAL_TABLET | ORAL | Status: AC
Start: 2017-11-18 — End: 2017-11-18
  Filled 2017-11-18: qty 2

## 2017-11-18 MED ORDER — ACETAMINOPHEN 325 MG PO TABS
650.0000 mg | ORAL_TABLET | Freq: Once | ORAL | Status: AC
  Administered 2017-11-18: 650 mg via ORAL

## 2017-11-18 MED ORDER — HEPARIN SOD (PORK) LOCK FLUSH 100 UNIT/ML IV SOLN
500.0000 [IU] | Freq: Once | INTRAVENOUS | Status: AC | PRN
  Administered 2017-11-18: 500 [IU]
  Filled 2017-11-18: qty 5

## 2017-11-18 MED ORDER — SODIUM CHLORIDE 0.9 % IV SOLN
Freq: Once | INTRAVENOUS | Status: AC
  Administered 2017-11-18: 09:00:00 via INTRAVENOUS

## 2017-11-18 MED ORDER — TRASTUZUMAB CHEMO 150 MG IV SOLR
6.0000 mg/kg | Freq: Once | INTRAVENOUS | Status: AC
  Administered 2017-11-18: 504 mg via INTRAVENOUS
  Filled 2017-11-18: qty 24

## 2017-11-18 MED ORDER — DIPHENHYDRAMINE HCL 25 MG PO CAPS
ORAL_CAPSULE | ORAL | Status: AC
  Filled 2017-11-18: qty 2

## 2017-11-18 MED ORDER — SODIUM CHLORIDE 0.9% FLUSH
10.0000 mL | INTRAVENOUS | Status: DC | PRN
  Administered 2017-11-18: 10 mL
  Filled 2017-11-18: qty 10

## 2017-11-18 MED ORDER — DIPHENHYDRAMINE HCL 25 MG PO CAPS
50.0000 mg | ORAL_CAPSULE | Freq: Once | ORAL | Status: AC
  Administered 2017-11-18: 50 mg via ORAL

## 2017-11-18 NOTE — Telephone Encounter (Signed)
Mailed patient calendar of upcoming March and April appointments.  °

## 2017-11-18 NOTE — Patient Instructions (Signed)
[facility] Discharge Instructions for Patients Receiving Chemotherapy  Today you received the following chemotherapy agents:  Herceptin (trastuzumab)  To help prevent nausea and vomiting after your treatment, we encourage you to take your nausea medication as prescribed.   If you develop nausea and vomiting that is not controlled by your nausea medication, call the clinic.   BELOW ARE SYMPTOMS THAT SHOULD BE REPORTED IMMEDIATELY:  *FEVER GREATER THAN 100.5 F  *CHILLS WITH OR WITHOUT FEVER  NAUSEA AND VOMITING THAT IS NOT CONTROLLED WITH YOUR NAUSEA MEDICATION  *UNUSUAL SHORTNESS OF BREATH  *UNUSUAL BRUISING OR BLEEDING  TENDERNESS IN MOUTH AND THROAT WITH OR WITHOUT PRESENCE OF ULCERS  *URINARY PROBLEMS  *BOWEL PROBLEMS  UNUSUAL RASH Items with * indicate a potential emergency and should be followed up as soon as possible.  Feel free to call the clinic should you have any questions or concerns. The clinic phone number is [phone].  Please show the CHEMO ALERT CARD at check-in to the Emergency Department and triage nurse.   

## 2017-11-22 ENCOUNTER — Telehealth: Payer: Self-pay

## 2017-11-22 NOTE — Progress Notes (Signed)
Simulation note   ICD-10-CM   1. Malignant neoplasm of lower-outer quadrant of left breast of female, estrogen receptor positive ([place]) C50.512    Z17.0     The patient was brought to the treatment room for simulation for the patient's upcoming electron treatment. The patient was setup in the treatment position and the target region was delineated. The patient will receive treatment to the left breast/ chest wall using an en face electron field. One customized block/complex treatment device has been constructed for this purpose, and this will be used on a daily basis during the patient's treatment. After appropriate set up was confirmed, skin markings were placed to allow accurate targeting of the treatment area during the patient's course of therapy. -----------------------------------  Eppie Gibson, MD

## 2017-11-22 NOTE — Telephone Encounter (Signed)
Sent message for patient informing her a letter for work is available for her to pickup anytime this week prior to 430p.   Cyndia Bent RN

## 2017-11-22 NOTE — Progress Notes (Signed)
Simulation verification Malignant neoplasm of lower-outer quadrant of left breast of female, estrogen receptor positive ([name])    ICD-10-CM   1. Malignant neoplasm of lower-outer quadrant of left breast of female, estrogen receptor positive ([name]) C50.512    Z17.0     The patient was brought to the treatment machine and placed in the plan treatment position.  Clinical set up was verified to ensure that the target region is appropriately covered for the patient's upcoming electron boost treatment.  The targeted volume of tissue is appropriately covered by the radiation field.  Based on my personal review, I approve the simulation verification.  The patient's treatment will proceed as planned.  ------------------------------------------------------------------  Haley Gibson, MD

## 2017-11-25 ENCOUNTER — Encounter (HOSPITAL_COMMUNITY): Payer: Self-pay | Admitting: Internal Medicine

## 2017-11-25 ENCOUNTER — Ambulatory Visit (HOSPITAL_BASED_OUTPATIENT_CLINIC_OR_DEPARTMENT_OTHER)
Admission: RE | Admit: 2017-11-25 | Discharge: 2017-11-25 | Disposition: A | Discharge status: Home / Self Care | Payer: BC Managed Care – PPO | Source: Ambulatory Visit | Attending: Internal Medicine | Admitting: Internal Medicine

## 2017-11-25 ENCOUNTER — Ambulatory Visit (HOSPITAL_COMMUNITY)
Admission: RE | Admit: 2017-11-25 | Discharge: 2017-11-25 | Disposition: A | Discharge status: Home / Self Care | Payer: BC Managed Care – PPO | Source: Ambulatory Visit | Attending: Family Medicine | Admitting: Family Medicine

## 2017-11-25 ENCOUNTER — Other Ambulatory Visit (HOSPITAL_COMMUNITY): Payer: Self-pay | Admitting: *Deleted

## 2017-11-25 VITALS — BP 148/94 | HR 98 | Wt 198.1 lb

## 2017-11-25 DIAGNOSIS — C50412 Malignant neoplasm of upper-outer quadrant of left female breast: Secondary | ICD-10-CM

## 2017-11-25 DIAGNOSIS — J45909 Unspecified asthma, uncomplicated: Secondary | ICD-10-CM | POA: Insufficient documentation

## 2017-11-25 DIAGNOSIS — Z8042 Family history of malignant neoplasm of prostate: Secondary | ICD-10-CM | POA: Insufficient documentation

## 2017-11-25 DIAGNOSIS — F329 Major depressive disorder, single episode, unspecified: Secondary | ICD-10-CM | POA: Insufficient documentation

## 2017-11-25 DIAGNOSIS — I10 Essential (primary) hypertension: Secondary | ICD-10-CM | POA: Diagnosis not present

## 2017-11-25 DIAGNOSIS — F419 Anxiety disorder, unspecified: Secondary | ICD-10-CM | POA: Insufficient documentation

## 2017-11-25 DIAGNOSIS — Z170 Estrogen receptor positive status [ER+]: Secondary | ICD-10-CM

## 2017-11-25 DIAGNOSIS — Z803 Family history of malignant neoplasm of breast: Secondary | ICD-10-CM | POA: Diagnosis not present

## 2017-11-25 DIAGNOSIS — Z79899 Other long term (current) drug therapy: Secondary | ICD-10-CM | POA: Diagnosis not present

## 2017-11-25 NOTE — Progress Notes (Signed)
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 Lindi Adie  for enrollment into the Cardio-Oncology program.  SUMMARY OF ONCOLOGIC HISTORY:       Malignant neoplasm of lower-outer quadrant of left breast of female, estrogen receptor positive ([place])   03/01/2017 Initial Diagnosis    Left breast 2:30 position: 2.1 cm irregular solid mass, additional 0.8 cm oval mass in the left breast (fibrocystic); primary mass on biopsy revealed IDC grade 3, ER 100%, PR 80%, Ki-67 25%, HER-2 positive ratio 1.88, copy #6.2, T2 N0 stage II a clinical stage        Therapy plan 1. Neoadjuvant Taxol Herceptin weekly 12 followed by Herceptin maintenance for 6 months 2. breast conserving surgery with sentinel lymph node biopsy 3. Adjuvant radiation therapy 4. Followed by adjuvant antiestrogen therapy  Pt has finished Taxol and XRT.  She presents today for regular follow up. Echo parameters thought to be mildly depressed so follow up moved closer.   Doing well. Tolerating Herceptin well. More energy. No edema, SOB or orthopnea. Tolerating spiro 25 well. Taking BP at home routinely systolics typically 407. Walkingher dog regularly.   ECHO (03/18/17): 60-65%  Lat s' 9.6, -18.3% Grade II DD  ECHO (07/06/17): 60%  GLS -19.8% LS 11.3cm/s grade I DD  ECHO (10/06/17): EF 55-60% LS 11.5 GLS -16.0 (underestimated)  ECHO Today: EF 55-60%, LS' 11.1, GLS -12.8 (understimated) - Personally reviewed    Past Medical History:  Diagnosis Date  . Anxiety   . Asthma   . Cancer [facility])    breast cancer  . Complication of anesthesia   . Depression   . Family history of breast cancer   . Family history of prostate cancer   . History of radiation therapy 08/29/2017- 10/03/17   Left Breast 40.05 Gy in 15 fractions followed by a boost of 10 Gy per fraction.   .  Hypertension   . PONV (postoperative nausea and vomiting)   . Seasonal allergies     Current Outpatient Medications  Medication Sig Dispense Refill  . albuterol (PROVENTIL HFA;VENTOLIN HFA) 108 (90 Base) MCG/ACT inhaler Inhale 2 puffs into the lungs every 6 (six) hours as needed for wheezing or shortness of breath.    Marland Kitchen buPROPion (WELLBUTRIN XL) 300 MG 24 hr tablet Take 300 mg by mouth daily.    Marland Kitchen letrozole (FEMARA) 2.5 MG tablet Take 1 tablet (2.5 mg total) by mouth daily. 30 tablet 2  . lidocaine-prilocaine (EMLA) cream     . losartan (COZAAR) 100 MG tablet Take 100 mg by mouth daily.   0  . ondansetron (ZOFRAN) 8 MG tablet Take by mouth.    Marland Kitchen PARoxetine (PAXIL) 40 MG tablet Take 40 mg by mouth every evening.     . prochlorperazine (COMPAZINE) 10 MG tablet Take by mouth.    . spironolactone (ALDACTONE) 25 MG tablet Take 1 tablet (25 mg total) by mouth daily. 30 tablet 3   No current facility-administered medications for this encounter.     Allergies  Allergen Reactions  . No Known Allergies       Social History   Socioeconomic History  . Marital status: Divorced    Spouse name: Not on file  . Number of children: Not on file  . Years of education: Not on file  . Highest education level: Not on file  Social Needs  .  Financial resource strain: Not on file  . Food insecurity - worry: Not on file  . Food insecurity - inability: Not on file  . Transportation needs - medical: Not on file  . Transportation needs - non-medical: Not on file  Occupational History  . Not on file  Tobacco Use  . Smoking status: Never Smoker  . Smokeless tobacco: Never Used  Substance and Sexual Activity  . Alcohol use: No    Comment: rarely  . Drug use: Yes    Types: Marijuana    Comment: last use 03/25/17  . Sexual activity: Not Currently  Other Topics Concern  . Not on file  Social History Narrative  . Not on file      Family History  Problem Relation Age of Onset  . Heart disease  Mother   . Hypertension Mother   . Kidney failure Father   . Diabetes Father   . Hypertension Father   . Prostate cancer Father 56  . Brain cancer Maternal Grandmother 40  . Prostate cancer Maternal Grandfather        dx in his 68s  . Breast cancer Paternal Grandmother   . Alcohol abuse Maternal Aunt   . Other Maternal Uncle        died in [place]  . Breast cancer Cousin        maternal first cousin dx in her 24s  . Mental retardation Cousin   . Breast cancer Other        maternal second cousin  . Breast cancer Cousin        paternal first cousin  . Breast cancer Cousin        paternal first cousin    Vitals:   11/25/17 1457  BP: (!) 148/94  Pulse: 98  SpO2: 98%  Weight: 198 lb 1.9 oz (89.9 kg)    PHYSICAL EXAM: General:  Well appearing. No resp difficulty HEENT: normal Neck: supple. no JVD. Carotids 2+ bilat; no bruits. No lymphadenopathy or thryomegaly appreciated. Cor: PMI nondisplaced. Regular rate & rhythm. No rubs, gallops or murmurs. RIJ port.  Lungs: clear Abdomen: soft, nontender, nondistended. No hepatosplenomegaly. No bruits or masses. Good bowel sounds. Extremities: no cyanosis, clubbing, rash, edema Neuro: alert & orientedx3, cranial nerves grossly intact. moves all 4 extremities w/o difficulty. Affect pleasant  ASSESSMENT & PLAN: 1. Left breast cancer - Diagnosed 5/18 - Grade 3, ER 100%, PR 80%, Ki-67 25%, HER-2 positive ratio 1.88, copy #6.2, T2 N0 stage II a clinical stage - Tolerating Herceptin well.  - ECHO today personally reviewed: EF 55-60%, LS' 11.1, GLS -12.8 (understimated) - I reviewed echos personally. EF and Doppler parameters stable. No HF on exam. Continue Herceptin.   2. HTN - BP improved with spiro but still slightly elevated. - Will try diet and weight loss for now. May need to add amlodipine.   Glori Bickers, MD  3:24 PM

## 2017-11-25 NOTE — Patient Instructions (Signed)
Echocardiogram and f/u Beginning of May

## 2017-11-25 NOTE — Progress Notes (Signed)
  Echocardiogram 2D Echocardiogram has been performed.  Haley Sanchez 11/25/2017, 2:55 PM

## 2017-11-25 NOTE — Addendum Note (Signed)
Encounter addended by: Darron Doom, RN on: 11/25/2017 3:29 PM  Actions taken: Sign clinical note

## 2017-11-25 NOTE — Addendum Note (Signed)
Encounter addended by: Darron Doom, RN on: 11/25/2017 3:31 PM  Actions taken: Order list changed, Diagnosis association updated

## 2017-12-09 ENCOUNTER — Inpatient Hospital Stay: Payer: BC Managed Care – PPO

## 2017-12-09 ENCOUNTER — Inpatient Hospital Stay (HOSPITAL_BASED_OUTPATIENT_CLINIC_OR_DEPARTMENT_OTHER): Payer: BC Managed Care – PPO | Admitting: Adult Health

## 2017-12-09 ENCOUNTER — Other Ambulatory Visit: Payer: Self-pay | Admitting: Adult Health

## 2017-12-09 ENCOUNTER — Inpatient Hospital Stay: Payer: BC Managed Care – PPO | Attending: Adult Health

## 2017-12-09 ENCOUNTER — Encounter: Payer: Self-pay | Admitting: Adult Health

## 2017-12-09 VITALS — BP 148/89 | HR 87 | Temp 98.9°F | Resp 18 | Ht 66.0 in | Wt 198.7 lb

## 2017-12-09 DIAGNOSIS — M79672 Pain in left foot: Principal | ICD-10-CM

## 2017-12-09 DIAGNOSIS — C50512 Malignant neoplasm of lower-outer quadrant of left female breast: Secondary | ICD-10-CM

## 2017-12-09 DIAGNOSIS — Z170 Estrogen receptor positive status [ER+]: Secondary | ICD-10-CM

## 2017-12-09 DIAGNOSIS — Z5112 Encounter for antineoplastic immunotherapy: Secondary | ICD-10-CM | POA: Insufficient documentation

## 2017-12-09 DIAGNOSIS — M722 Plantar fascial fibromatosis: Secondary | ICD-10-CM

## 2017-12-09 DIAGNOSIS — R21 Rash and other nonspecific skin eruption: Secondary | ICD-10-CM

## 2017-12-09 DIAGNOSIS — Z95828 Presence of other vascular implants and grafts: Secondary | ICD-10-CM

## 2017-12-09 DIAGNOSIS — M79671 Pain in right foot: Secondary | ICD-10-CM

## 2017-12-09 DIAGNOSIS — M26629 Arthralgia of temporomandibular joint, unspecified side: Secondary | ICD-10-CM

## 2017-12-09 DIAGNOSIS — H9202 Otalgia, left ear: Secondary | ICD-10-CM

## 2017-12-09 LAB — COMPREHENSIVE METABOLIC PANEL
ALBUMIN: 3.7 g/dL (ref 3.5–5.0)
ALT: 21 U/L (ref 0–55)
ANION GAP: 9 (ref 3–11)
AST: 19 U/L (ref 5–34)
Alkaline Phosphatase: 82 U/L (ref 40–150)
BILIRUBIN TOTAL: 0.4 mg/dL (ref 0.2–1.2)
BUN: 15 mg/dL (ref 7–26)
CO2: 27 mmol/L (ref 22–29)
Calcium: 9.3 mg/dL (ref 8.4–10.4)
Chloride: 104 mmol/L (ref 98–109)
Creatinine, Ser: 1.11 mg/dL — ABNORMAL HIGH (ref 0.60–1.10)
GFR calc Af Amer: >60 mL/min (ref >60)
GFR calc non Af Amer: 54 mL/min — ABNORMAL LOW (ref >60)
GLUCOSE: 79 mg/dL (ref 70–140)
POTASSIUM: 4.5 mmol/L (ref 3.5–5.1)
SODIUM: 140 mmol/L (ref 136–145)
TOTAL PROTEIN: 6.4 g/dL (ref 6.4–8.3)

## 2017-12-09 LAB — CBC WITH DIFFERENTIAL/PLATELET
BASOS PCT: 1 %
Basophils Absolute: 0 10*3/uL (ref 0.0–0.1)
EOS ABS: 0.2 10*3/uL (ref 0.0–0.5)
Eosinophils Relative: 6 %
HEMATOCRIT: 36.9 % (ref 34.8–46.6)
Hemoglobin: 12.4 g/dL (ref 11.6–15.9)
Lymphocytes Relative: 26 %
Lymphs Abs: 0.8 10*3/uL — ABNORMAL LOW (ref 0.9–3.3)
MCH: 30.5 pg (ref 25.1–34.0)
MCHC: 33.7 g/dL (ref 31.5–36.0)
MCV: 90.4 fL (ref 79.5–101.0)
MONO ABS: 0.3 10*3/uL (ref 0.1–0.9)
MONOS PCT: 10 %
Neutro Abs: 1.8 10*3/uL (ref 1.5–6.5)
Neutrophils Relative %: 57 %
Platelets: 225 10*3/uL (ref 145–400)
RBC: 4.08 MIL/uL (ref 3.70–5.45)
RDW: 13.5 % (ref 11.2–14.5)
WBC: 3.2 10*3/uL — ABNORMAL LOW (ref 3.9–10.3)

## 2017-12-09 MED ORDER — HEPARIN SOD (PORK) LOCK FLUSH 100 UNIT/ML IV SOLN
500.0000 [IU] | Freq: Once | INTRAVENOUS | Status: AC | PRN
  Administered 2017-12-09: 500 [IU]
  Filled 2017-12-09: qty 5

## 2017-12-09 MED ORDER — SODIUM CHLORIDE 0.9% FLUSH
10.0000 mL | INTRAVENOUS | Status: DC | PRN
  Administered 2017-12-09: 10 mL
  Filled 2017-12-09: qty 10

## 2017-12-09 MED ORDER — DIPHENHYDRAMINE HCL 25 MG PO CAPS
50.0000 mg | ORAL_CAPSULE | Freq: Once | ORAL | Status: AC
  Administered 2017-12-09: 50 mg via ORAL

## 2017-12-09 MED ORDER — SODIUM CHLORIDE 0.9 % IV SOLN
Freq: Once | INTRAVENOUS | Status: AC
  Administered 2017-12-09: 11:00:00 via INTRAVENOUS

## 2017-12-09 MED ORDER — SODIUM CHLORIDE 0.9% FLUSH
10.0000 mL | Freq: Once | INTRAVENOUS | Status: AC
  Administered 2017-12-09: 10 mL
  Filled 2017-12-09: qty 10

## 2017-12-09 MED ORDER — TRASTUZUMAB CHEMO 150 MG IV SOLR
6.0000 mg/kg | Freq: Once | INTRAVENOUS | Status: AC
  Administered 2017-12-09: 504 mg via INTRAVENOUS
  Filled 2017-12-09: qty 24

## 2017-12-09 MED ORDER — TRIAMCINOLONE 0.1 % CREAM:EUCERIN CREAM 1:1: 1.0000 "application " | TOPICAL_CREAM | Freq: Three times a day (TID) | CUTANEOUS | 1 refills | Status: DC | PRN

## 2017-12-09 MED ORDER — ACETAMINOPHEN 325 MG PO TABS
ORAL_TABLET | ORAL | Status: AC
  Filled 2017-12-09: qty 2

## 2017-12-09 MED ORDER — ACETAMINOPHEN 325 MG PO TABS
650.0000 mg | ORAL_TABLET | Freq: Once | ORAL | Status: AC
  Administered 2017-12-09: 650 mg via ORAL

## 2017-12-09 MED ORDER — DIPHENHYDRAMINE HCL 25 MG PO CAPS
ORAL_CAPSULE | ORAL | Status: AC
  Filled 2017-12-09: qty 2

## 2017-12-09 NOTE — Progress Notes (Signed)
[facility] Follow up:    Haley Melter, MD [address] 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, estrogen receptor positive ([place]) Staging form: Breast, AJCC 8th Edition - Clinical stage from 03/09/2017: Stage IB (cT2, cN0, cM0, G3, ER: Positive, PR: Positive, HER2: Positive) - Unsigned Staging comments: Staged at breast conference on 5.30.18 - Pathologic: No Stage Recommended (ypT1c, pN0, cM0, G2, ER: Positive, PR: Positive, HER2: Negative) - Signed by Gardenia Phlegm, NP on 08/03/2017   SUMMARY OF ONCOLOGIC HISTORY:   Malignant neoplasm of lower-outer quadrant of left breast of female, estrogen receptor positive ([place])   03/01/2017 Initial Diagnosis    Left breast 2:30 position: 2.1 cm irregular solid mass, additional 0.8 cm oval mass in the left breast (fibrocystic); primary mass on biopsy revealed IDC grade 3, ER 100%, PR 80%, Ki-67 25%, HER-2 positive ratio 1.88, copy #6.2, T2 N0 stage II a clinical stage      03/29/2017 - 06/14/2017 Neo-Adjuvant Chemotherapy    Neoadjuvant chemotherapy with 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  The Multi-Gene Panel offered by [facility] includes sequencing and/or deletion duplication testing of the following 80 genes: ALK, APC, ATM, AXIN2,BAP1,  BARD1, BLM, BMPR1A, BRCA1, BRCA2, BRIP1, CASR, CDC73, CDH1, CDK4, CDKN1B, CDKN1C, CDKN2A (p14ARF), CDKN2A (p16INK4a), CEBPA, CHEK2, CTNNA1, DICER1, DIS3L2, EGFR (c.2369C>T, p.Thr790Met variant only), EPCAM (Deletion/duplication testing only), FH, FLCN, GATA2, GPC3, GREM1 (Promoter region deletion/duplication testing only), HOXB13 (c.251G>A, p.Gly84Glu), HRAS, KIT, MAX, MEN1, MET, MITF (c.952G>A, p.Glu318Lys variant only), MLH1, MSH2, MSH3, MSH6, MUTYH, NBN, NF1, NF2, NTHL1, PALB2, PDGFRA, PHOX2B, PMS2,  POLD1, POLE, POT1, PRKAR1A, PTCH1, PTEN, RAD50, RAD51C, RAD51D, RB1, RECQL4, RET, RUNX1, SDHAF2, SDHA (sequence changes only), SDHB, SDHC, SDHD, SMAD4, SMARCA4, SMARCB1, SMARCE1, STK11, SUFU, TERT, TERT, TMEM127, TP53, TSC1, TSC2, VHL, WRN and WT1.  The report date is April 27, 2017.       07/21/2017 Surgery    Left lumpectomy: Grade 2 IDC 1.9 cm with DCIS, margins negative, 0/4 lymph nodes negative, ER 100%, PR 80%, HER-2 positive, Ki-67 25%, RCB class II,T1c N0 stage IA      08/29/2017 - 10/03/2017 Radiation Therapy    Adjuvant radiation:1. Left breast, 2.67 Gy in 15 fractions for a total dose of 40.05 Gy  2. Boost, 2 Gy in 5 fractions for a total dose of 10 Gy          CURRENT THERAPY: Herceptin/Letrozole  INTERVAL HISTORY: Haley Sanchez 57 y.o. female returns for follow up prior to herceptin.  She is having issues in her left jaw with pain x 2 weeks and fullness in her left ear.  She was seen by her PCP and put on antibiotic Amoxicillin on Monday.  The pain is not improved.  She cannot open her mouth.  The pain is located near her tempo mandibular joint.  She does not chew a lot of gum, or mints.  She was recommended to see ENT, and has not been able to get in yet.    She also has a rash on her legs.  This rash started a couple of weeks ago.  She has had this before and she was prescribed a cream previously that helped.    Haley Sanchez is also having difficulty walking due to pain in her feet.  She attributes this  to plantar fasciitis.  She is wearing inserts for her shoes that she wears daily, however they are from 2005.  This pain is noted all throughout the day.  She has been doing exercises for the feet.    She started Letrozole on 11/25/17 and is tolerating this medication well.  She denies any issues with this medication.     Patient Active Problem List   Diagnosis Date Noted  . Cystitis 05/06/2017  . Hematuria 05/05/2017  . Genetic testing 04/29/2017  . Family history of breast  cancer   . Family history of prostate cancer   . Port catheter in place 04/05/2017  . Malignant neoplasm of lower-outer quadrant of left breast of female, estrogen receptor positive ([place]) 03/03/2017    is allergic to no known allergies.  MEDICAL HISTORY: Past Medical History:  Diagnosis Date  . Anxiety   . Asthma   . Cancer [facility])    breast cancer  . Complication of anesthesia   . Depression   . Family history of breast cancer   . Family history of prostate cancer   . History of radiation therapy 08/29/2017- 10/03/17   Left Breast 40.05 Gy in 15 fractions followed by a boost of 10 Gy per fraction.   . Hypertension   . PONV (postoperative nausea and vomiting)   . Seasonal allergies     SURGICAL HISTORY: Past Surgical History:  Procedure Laterality Date  . BREAST LUMPECTOMY WITH RADIOACTIVE SEED AND SENTINEL LYMPH NODE BIOPSY Left 07/21/2017   Procedure: BREAST LUMPECTOMY WITH RADIOACTIVE SEED AND SENTINEL LYMPH NODE BIOPSY;  Surgeon: Rolm Bookbinder, MD;  Location: [place];  Service: General;  Laterality: Left;  . PORTACATH PLACEMENT Right 03/28/2017   Procedure: INSERTION PORT-A-CATH WITH [place];  Surgeon: Rolm Bookbinder, MD;  Location: [place];  Service: General;  Laterality: Right;    SOCIAL HISTORY: Social History   Socioeconomic History  . Marital status: Divorced    Spouse name: Not on file  . Number of children: Not on file  . Years of education: Not on file  . Highest education level: Not on file  Social Needs  . Financial resource strain: Not on file  . Food insecurity - worry: Not on file  . Food insecurity - inability: Not on file  . Transportation needs - medical: Not on file  . Transportation needs - non-medical: Not on file  Occupational History  . Not on file  Tobacco Use  . Smoking status: Never Smoker  . Smokeless tobacco: Never Used  Substance and Sexual Activity  . Alcohol use: No    Comment: rarely  . Drug use: Yes     Types: Marijuana    Comment: last use 03/25/17  . Sexual activity: Not Currently  Other Topics Concern  . Not on file  Social History Narrative  . Not on file    FAMILY HISTORY: Family History  Problem Relation Age of Onset  . Heart disease Mother   . Hypertension Mother   . Kidney failure Father   . Diabetes Father   . Hypertension Father   . Prostate cancer Father 68  . Brain cancer Maternal Grandmother 30  . Prostate cancer Maternal Grandfather        dx in his 37s  . Breast cancer Paternal Grandmother   . Alcohol abuse Maternal Aunt   . Other Maternal Uncle        died in [place]  . Breast cancer Cousin  maternal first cousin dx in her 97s  . Mental retardation Cousin   . Breast cancer Other        maternal second cousin  . Breast cancer Cousin        paternal first cousin  . Breast cancer Cousin        paternal first cousin    Review of Systems  Constitutional: Negative for appetite change, chills, fatigue and fever.  HENT:   Negative for hearing loss, lump/mass and trouble swallowing.   Eyes: Negative for eye problems and icterus.  Respiratory: Negative for chest tightness, cough and shortness of breath.   Cardiovascular: Negative for chest pain, leg swelling and palpitations.  Gastrointestinal: Negative for abdominal distention and abdominal pain.  Endocrine: Negative for hot flashes.  Genitourinary: Negative for difficulty urinating.   Musculoskeletal: Negative for arthralgias and back pain.  Skin: Negative for itching and rash.  Neurological: Negative for dizziness, extremity weakness and headaches.  Hematological: Negative for adenopathy. Does not bruise/bleed easily.  Psychiatric/Behavioral: Negative for depression. The patient is not nervous/anxious.       PHYSICAL EXAMINATION  ECOG PERFORMANCE STATUS: 1 - Symptomatic but completely ambulatory  Vitals:   12/09/17 1010  BP: (!) 148/89  Pulse: 87  Resp: 18  Temp: 98.9 F (37.2 C)  SpO2: 98%     Physical Exam  Constitutional: She is oriented to person, place, and time and well-developed, well-nourished, and in no distress.  HENT:  Head: Normocephalic and atraumatic.  Mouth/Throat: Oropharynx is clear and moist. No oropharyngeal exudate.  Tenderness noted to left TMJ  Eyes: Pupils are equal, round, and reactive to light. No scleral icterus.  Neck: Neck supple.  Cardiovascular: Normal rate, regular rhythm and normal heart sounds.  Pulmonary/Chest: Effort normal and breath sounds normal.  Abdominal: Soft. Bowel sounds are normal. She exhibits no distension. There is no tenderness.  Musculoskeletal: She exhibits no edema.  Tenderness noted in bilateral plantar fasciae  Lymphadenopathy:    She has no cervical adenopathy.  Neurological: She is alert and oriented to person, place, and time.  Skin: Skin is warm and dry. Rash noted. No erythema.  Erythematous patchy rash scattered over bilateral lower extremities  Psychiatric: Mood and affect normal.    LABORATORY DATA:  CBC    Component Value Date/Time   WBC 3.2 (L) 12/09/2017 0923   RBC 4.08 12/09/2017 0923   HGB 12.4 12/09/2017 0923   HGB 13.4 09/16/2017 1314   HCT 36.9 12/09/2017 0923   HCT 39.5 09/16/2017 1314   PLT 225 12/09/2017 0923   PLT 236 09/16/2017 1314   MCV 90.4 12/09/2017 0923   MCV 91.0 09/16/2017 1314   MCH 30.5 12/09/2017 0923   MCHC 33.7 12/09/2017 0923   RDW 13.5 12/09/2017 0923   RDW 12.9 09/16/2017 1314   LYMPHSABS 0.8 (L) 12/09/2017 0923   LYMPHSABS 0.9 09/16/2017 1314   MONOABS 0.3 12/09/2017 0923   MONOABS 0.8 09/16/2017 1314   EOSABS 0.2 12/09/2017 0923   EOSABS 0.0 09/16/2017 1314   BASOSABS 0.0 12/09/2017 0923   BASOSABS 0.0 09/16/2017 1314    CMP     Component Value Date/Time   NA 140 12/09/2017 0923   NA 139 09/16/2017 1314   K 4.5 12/09/2017 0923   K 4.0 09/16/2017 1314   CL 104 12/09/2017 0923   CO2 27 12/09/2017 0923   CO2 22 09/16/2017 1314   GLUCOSE 79 12/09/2017  0923   GLUCOSE 84 09/16/2017 1314   BUN 15 12/09/2017  7829   BUN 22.5 09/16/2017 1314   CREATININE 1.11 (H) 12/09/2017 0923   CREATININE 1.2 (H) 09/16/2017 1314   CALCIUM 9.3 12/09/2017 0923   CALCIUM 8.5 09/16/2017 1314   PROT 6.4 12/09/2017 0923   PROT 6.3 (L) 09/16/2017 1314   ALBUMIN 3.7 12/09/2017 0923   ALBUMIN 3.5 09/16/2017 1314   AST 19 12/09/2017 0923   AST 13 09/16/2017 1314   ALT 21 12/09/2017 0923   ALT 16 09/16/2017 1314   ALKPHOS 82 12/09/2017 0923   ALKPHOS 84 09/16/2017 1314   BILITOT 0.4 12/09/2017 0923   BILITOT 0.25 09/16/2017 1314   GFRNONAA 54 (L) 12/09/2017 0923   GFRAA >60 12/09/2017 0923      ASSESSMENT and PLAN:   Malignant neoplasm of lower-outer quadrant of left breast of female, estrogen receptor positive ([place]) Left breast 2:30 position: 2.1 cm irregular solid mass, additional 0.8 cm oval mass in the left breast (fibrocystic); primary mass on biopsy revealed IDC grade 3, ER 100%, PR 80%, Ki-67 25%, HER-2 positive ratio 1.88, copy #6.2, T2 N0 stage II a clinical stage  Treatment summary: 1. Neoadjuvant Taxol Herceptin weekly 11 completed 09/04/2018followed by Herceptin maintenance for 6 months 2. breast conserving surgery with sentinel lymph node biopsy done on 07/21/2017 07/21/2017 Left lumpectomy: Grade 2 IDC 1.9 cm with DCIS, margins negative, 0/4 lymph nodes negative, ER 100%, PR 80%, HER-2 positive, Ki-67 25%, RCB class II,T1c N0 stage IA 3. Adjuvant radiation therapy finished on 10/03/17 4. Letrozole started 11/25/08  Current treatment: 1. Continue adjuvant Herceptin through 02/2018   Jozlyn and I reviewed her treatment.  She will proceed with Herceptin today.  She and I discussed Letrozole in detail.  She started this on 11/25/17 and is toelrating it well. Her last echo on 11/25/17 was normal.  She will need a mammogram in 03/2018 (at least 6 months following radiation).  I have ordered this and we will get this scheduled.  We will schedule  bone density at this time if she has not already had one.    TMJ pain: I referred her to Dr. Lucia Gaskins in ENT, I also recommended she take Aleve BID.    Plantar Fasciitis: It sounds like she has exhausted conservative therapies and needs injections.  I referred her to podiatry today.  Skin Rash: I prescribed Triamcinolone cream for her rash as it has worked for her in the past.    Return to clinic every 3 weeks for Herceptin every 6 weeks for follow-up along with Herceptin.  Her next visit we will change to a Survivorship Care Plan visit.     Orders Placed This Encounter  Procedures  . Ambulatory referral to Podiatry    Referral Priority:   Urgent    Referral Type:   Consultation    Referral Reason:   Specialty Services Required    Referred to Provider:   Wallene Huh, DPM    Requested Specialty:   Podiatry    Number of Visits Requested:   1  . Ambulatory referral to ENT    Referral Priority:   Urgent    Referral Type:   Consultation    Referral Reason:   Specialty Services Required    Referred to Provider:   Rozetta Nunnery, MD    Requested Specialty:   Otolaryngology    Number of Visits Requested:   1    All questions were answered. The patient knows to call the clinic with any problems, questions or concerns. We  can certainly see the patient much sooner if necessary.  A total of (30) minutes of face-to-face time was spent with this patient with greater than 50% of that time in counseling and care-coordination.  This note was electronically signed. Scot Dock, NP 12/09/2017

## 2017-12-09 NOTE — Patient Instructions (Signed)
[facility] Discharge Instructions for Patients Receiving Chemotherapy  Today you received the following chemotherapy agents Herceptin  To help prevent nausea and vomiting after your treatment, we encourage you to take your nausea medication as directed   If you develop nausea and vomiting that is not controlled by your nausea medication, call the clinic.   BELOW ARE SYMPTOMS THAT SHOULD BE REPORTED IMMEDIATELY:  *FEVER GREATER THAN 100.5 F  *CHILLS WITH OR WITHOUT FEVER  NAUSEA AND VOMITING THAT IS NOT CONTROLLED WITH YOUR NAUSEA MEDICATION  *UNUSUAL SHORTNESS OF BREATH  *UNUSUAL BRUISING OR BLEEDING  TENDERNESS IN MOUTH AND THROAT WITH OR WITHOUT PRESENCE OF ULCERS  *URINARY PROBLEMS  *BOWEL PROBLEMS  UNUSUAL RASH Items with * indicate a potential emergency and should be followed up as soon as possible.  Feel free to call the clinic should you have any questions or concerns. The clinic phone number is [phone].  Please show the CHEMO ALERT CARD at check-in to the Emergency Department and triage nurse.   

## 2017-12-09 NOTE — Assessment & Plan Note (Addendum)
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 Neoadjuvant Taxol Herceptin weekly 11 completed 09/04/2018followed by Herceptin maintenance for 6 months 2. breast conserving surgery with sentinel lymph node biopsy done on 07/21/2017 07/21/2017 Left lumpectomy: Grade 2 IDC 1.9 cm with DCIS, margins negative, 0/4 lymph nodes negative, ER 100%, PR 80%, HER-2 positive, Ki-67 25%, RCB class II,T1c N0 stage IA 3. Adjuvant radiation therapy finished on 10/03/17 4. Letrozole started 11/25/08  Current treatment: 1. Continue adjuvant Herceptin through 02/2018   Brieanna and I reviewed her treatment.  She will proceed with Herceptin today.  She and I discussed Letrozole in detail.  She started this on 11/25/17 and is toelrating it well. Her last echo on 11/25/17 was normal.  She will need a mammogram in 03/2018 (at least 6 months following radiation).  I have ordered this and we will get this scheduled.  We will schedule bone density at this time if she has not already had one.    TMJ pain: I referred her to Dr. Lucia Gaskins in ENT, I also recommended she take Aleve BID.    Plantar Fasciitis: It sounds like she has exhausted conservative therapies and needs injections.  I referred her to podiatry today.  Skin Rash: I prescribed Triamcinolone cream for her rash as it has worked for her in the past.    Return to clinic every 3 weeks for Herceptin every 6 weeks for follow-up along with Herceptin.  Her next visit we will change to a Survivorship Care Plan visit.

## 2017-12-12 ENCOUNTER — Telehealth: Payer: Self-pay | Admitting: Adult Health

## 2017-12-12 NOTE — Telephone Encounter (Signed)
Per 3/1 no los °

## 2017-12-17 ENCOUNTER — Emergency Department (HOSPITAL_COMMUNITY)
Admission: EM | Admit: 2017-12-17 | Discharge: 2017-12-17 | Disposition: A | Discharge status: Home / Self Care | Payer: BC Managed Care – PPO | Attending: Emergency Medicine | Admitting: Emergency Medicine

## 2017-12-17 ENCOUNTER — Emergency Department (HOSPITAL_COMMUNITY): Payer: BC Managed Care – PPO

## 2017-12-17 ENCOUNTER — Encounter (HOSPITAL_COMMUNITY): Payer: Self-pay | Admitting: Emergency Medicine

## 2017-12-17 DIAGNOSIS — I10 Essential (primary) hypertension: Secondary | ICD-10-CM | POA: Diagnosis not present

## 2017-12-17 DIAGNOSIS — R51 Headache: Secondary | ICD-10-CM | POA: Diagnosis not present

## 2017-12-17 DIAGNOSIS — J111 Influenza due to unidentified influenza virus with other respiratory manifestations: Secondary | ICD-10-CM | POA: Diagnosis not present

## 2017-12-17 DIAGNOSIS — Z853 Personal history of malignant neoplasm of breast: Secondary | ICD-10-CM | POA: Diagnosis not present

## 2017-12-17 DIAGNOSIS — R0981 Nasal congestion: Secondary | ICD-10-CM | POA: Insufficient documentation

## 2017-12-17 DIAGNOSIS — Z79899 Other long term (current) drug therapy: Secondary | ICD-10-CM | POA: Insufficient documentation

## 2017-12-17 DIAGNOSIS — R05 Cough: Secondary | ICD-10-CM | POA: Diagnosis not present

## 2017-12-17 DIAGNOSIS — R509 Fever, unspecified: Secondary | ICD-10-CM | POA: Diagnosis present

## 2017-12-17 DIAGNOSIS — E876 Hypokalemia: Secondary | ICD-10-CM | POA: Diagnosis not present

## 2017-12-17 DIAGNOSIS — R0789 Other chest pain: Secondary | ICD-10-CM | POA: Insufficient documentation

## 2017-12-17 DIAGNOSIS — R059 Cough, unspecified: Secondary | ICD-10-CM

## 2017-12-17 LAB — CBC WITH DIFFERENTIAL/PLATELET
BASOS ABS: 0 10*3/uL (ref 0.0–0.1)
BASOS PCT: 0 %
EOS ABS: 0 10*3/uL (ref 0.0–0.7)
Eosinophils Relative: 0 %
HEMATOCRIT: 33.4 % — AB (ref 36.0–46.0)
HEMOGLOBIN: 11 g/dL — AB (ref 12.0–15.0)
Lymphocytes Relative: 8 %
Lymphs Abs: 0.3 10*3/uL — ABNORMAL LOW (ref 0.7–4.0)
MCH: 30.9 pg (ref 26.0–34.0)
MCHC: 32.9 g/dL (ref 30.0–36.0)
MCV: 93.8 fL (ref 78.0–100.0)
MONO ABS: 0.6 10*3/uL (ref 0.1–1.0)
MONOS PCT: 15 %
NEUTROS ABS: 3.1 10*3/uL (ref 1.7–7.7)
NEUTROS PCT: 77 %
Platelets: 180 10*3/uL (ref 150–400)
RBC: 3.56 MIL/uL — ABNORMAL LOW (ref 3.87–5.11)
RDW: 13 % (ref 11.5–15.5)
WBC: 4 10*3/uL (ref 4.0–10.5)

## 2017-12-17 LAB — COMPREHENSIVE METABOLIC PANEL
ALBUMIN: 3.7 g/dL (ref 3.5–5.0)
ALT: 18 U/L (ref 14–54)
ANION GAP: 11 (ref 5–15)
AST: 22 U/L (ref 15–41)
Alkaline Phosphatase: 64 U/L (ref 38–126)
BUN: 13 mg/dL (ref 6–20)
CHLORIDE: 106 mmol/L (ref 101–111)
CO2: 24 mmol/L (ref 22–32)
Calcium: 8.7 mg/dL — ABNORMAL LOW (ref 8.9–10.3)
Creatinine, Ser: 1.07 mg/dL — ABNORMAL HIGH (ref 0.44–1.00)
GFR calc Af Amer: >60 mL/min (ref >60)
GFR calc non Af Amer: 57 mL/min — ABNORMAL LOW (ref >60)
GLUCOSE: 113 mg/dL — AB (ref 65–99)
POTASSIUM: 3.2 mmol/L — AB (ref 3.5–5.1)
SODIUM: 141 mmol/L (ref 135–145)
Total Bilirubin: 0.5 mg/dL (ref 0.3–1.2)
Total Protein: 6.6 g/dL (ref 6.5–8.1)

## 2017-12-17 LAB — I-STAT CG4 LACTIC ACID, ED: Lactic Acid, Venous: 1.5 mmol/L (ref 0.5–1.9)

## 2017-12-17 MED ORDER — HYDROCOD POLST-CPM POLST ER 10-8 MG/5ML PO SUER
5.0000 mL | Freq: Once | ORAL | Status: AC
  Administered 2017-12-17: 5 mL via ORAL
  Filled 2017-12-17: qty 5

## 2017-12-17 MED ORDER — SODIUM CHLORIDE 0.9 % IV BOLUS (SEPSIS)
1000.0000 mL | Freq: Once | INTRAVENOUS | Status: AC
  Administered 2017-12-17: 1000 mL via INTRAVENOUS

## 2017-12-17 MED ORDER — ACETAMINOPHEN 325 MG PO TABS
650.0000 mg | ORAL_TABLET | Freq: Once | ORAL | Status: AC
  Administered 2017-12-17: 650 mg via ORAL
  Filled 2017-12-17: qty 2

## 2017-12-17 MED ORDER — IPRATROPIUM-ALBUTEROL 0.5-2.5 (3) MG/3ML IN SOLN
3.0000 mL | Freq: Once | RESPIRATORY_TRACT | Status: AC
  Administered 2017-12-17: 3 mL via RESPIRATORY_TRACT
  Filled 2017-12-17: qty 3

## 2017-12-17 MED ORDER — HYDROCOD POLST-CPM POLST ER 10-8 MG/5ML PO SUER: 5.0000 mL | Freq: Two times a day (BID) | ORAL | 0 refills | Status: DC | PRN

## 2017-12-17 MED ORDER — POTASSIUM CHLORIDE CRYS ER 20 MEQ PO TBCR
40.0000 meq | EXTENDED_RELEASE_TABLET | Freq: Once | ORAL | Status: AC
  Administered 2017-12-17: 40 meq via ORAL
  Filled 2017-12-17: qty 2

## 2017-12-17 MED ORDER — HEPARIN SOD (PORK) LOCK FLUSH 100 UNIT/ML IV SOLN
500.0000 [IU] | Freq: Once | INTRAVENOUS | Status: AC
Start: 2017-12-17 — End: 2017-12-17
  Administered 2017-12-17: 500 [IU]
  Filled 2017-12-17: qty 5

## 2017-12-17 MED ORDER — KETOROLAC TROMETHAMINE 15 MG/ML IJ SOLN
15.0000 mg | Freq: Once | INTRAMUSCULAR | Status: AC
  Administered 2017-12-17: 15 mg via INTRAVENOUS
  Filled 2017-12-17: qty 1

## 2017-12-17 NOTE — ED Provider Notes (Signed)
[facility] DEPT Provider Note   CSN: [id number] Arrival date & time: 12/17/17  1428     History   Chief Complaint Chief Complaint  Patient presents with  . Fever  . Cough  . Generalized Body Aches    HPI Haley Sanchez is a 57 y.o. female.  HPI Haley Sanchez is a 57 y.o. female with hx of breast ca, currently on chemotherapy, last chemo 8 days ago, presents to ED with complaint of influenza. Pt states she has developed flulike symptoms 2 days ago.  She states she went to see her primary care doctor yesterday and was diagnosed with influenza.  She was placed on Tamiflu, states had a dose yesterday and today.  She states that she had a fever up to 101 at home, tachycardia, her doctor was concerned about possible sepsis and recommended coming to emergency department.  She denies any nausea or vomiting, no diarrhea, denies any chest pain or abdominal pain.  She states she is having nasal congestion, sore throat, neck pain, cough.  She admits to poor appetite and has not been eating as much.  Still try to drink some fluids.  She is taking Tylenol every 6 hours for pain and fever.  Last dose of Tylenol was 4 hours ago.  She is also using inhaler and nebulizer treatments every 4 hours for cough and wheezing.  She states she was also placed on prednisone yesterday because she was wheezing in the office.  She reports generalized weakness, shaking, chills, rigors.  Past Medical History:  Diagnosis Date  . Anxiety   . Asthma   . Cancer [facility])    breast cancer  . Complication of anesthesia   . Depression   . Family history of breast cancer   . Family history of prostate cancer   . History of radiation therapy 08/29/2017- 10/03/17   Left Breast 40.05 Gy in 15 fractions followed by a boost of 10 Gy per fraction.   . Hypertension   . PONV (postoperative nausea and vomiting)   . Seasonal allergies     Patient Active Problem List   Diagnosis Date Noted  .  Cystitis 05/06/2017  . Hematuria 05/05/2017  . Genetic testing 04/29/2017  . Family history of breast cancer   . Family history of prostate cancer   . Port catheter in place 04/05/2017  . Malignant neoplasm of lower-outer quadrant of left breast of female, estrogen receptor positive ([place]) 03/03/2017    Past Surgical History:  Procedure Laterality Date  . BREAST LUMPECTOMY WITH RADIOACTIVE SEED AND SENTINEL LYMPH NODE BIOPSY Left 07/21/2017   Procedure: BREAST LUMPECTOMY WITH RADIOACTIVE SEED AND SENTINEL LYMPH NODE BIOPSY;  Surgeon: Rolm Bookbinder, MD;  Location: [facility];  Service: General;  Laterality: Left;  . PORTACATH PLACEMENT Right 03/28/2017   Procedure: INSERTION PORT-A-CATH WITH [place];  Surgeon: Rolm Bookbinder, MD;  Location: [facility];  Service: General;  Laterality: Right;    OB History    Gravida Para Term Preterm AB Living   3       1 3    SAB TAB Ectopic Multiple Live Births       0           Home Medications    Prior to Admission medications   Medication Sig Start Date End Date Taking? Authorizing Provider  albuterol (PROVENTIL HFA;VENTOLIN HFA) 108 (90 Base) MCG/ACT inhaler Inhale 2 puffs into the lungs every 6 (six) hours as needed for wheezing  or shortness of breath.    [provider]  amoxicillin-clavulanate (AUGMENTIN) 875-125 MG tablet TAKE 1 TABLET BY MOUTH EVERY 12 HOURS FOR 7 DAYS 12/05/17   [provider]  buPROPion (WELLBUTRIN XL) 300 MG 24 hr tablet Take 300 mg by mouth daily.    [provider]  letrozole (FEMARA) 2.5 MG tablet Take 1 tablet (2.5 mg total) by mouth daily. 10/28/17   Gardenia Phlegm, NP  lidocaine-prilocaine (EMLA) cream  03/14/17   [provider]  losartan (COZAAR) 100 MG tablet Take 100 mg by mouth daily.  02/04/17   [provider]  ondansetron (ZOFRAN) 8 MG tablet Take by mouth. 03/14/17   [provider]  PARoxetine (PAXIL) 40 MG tablet Take 40 mg by mouth  every evening.     [provider]  prochlorperazine (COMPAZINE) 10 MG tablet Take by mouth. 03/14/17   [provider]  spironolactone (ALDACTONE) 25 MG tablet Take 1 tablet (25 mg total) by mouth daily. 11/07/17 02/05/18  Bensimhon, Shaune Pascal, MD  Triamcinolone Acetonide (TRIAMCINOLONE 0.1 % CREAM : EUCERIN) CREA Apply 1 application topically 3 (three) times daily as needed. 12/09/17   Gardenia Phlegm, NP    Family History Family History  Problem Relation Age of Onset  . Heart disease Mother   . Hypertension Mother   . Kidney failure Father   . Diabetes Father   . Hypertension Father   . Prostate cancer Father 73  . Brain cancer Maternal Grandmother 10  . Prostate cancer Maternal Grandfather        dx in his 3s  . Breast cancer Paternal Grandmother   . Alcohol abuse Maternal Aunt   . Other Maternal Uncle        died in [place]  . Breast cancer Cousin        maternal first cousin dx in her 71s  . Mental retardation Cousin   . Breast cancer Other        maternal second cousin  . Breast cancer Cousin        paternal first cousin  . Breast cancer Cousin        paternal first cousin    Social History Social History   Tobacco Use  . Smoking status: Never Smoker  . Smokeless tobacco: Never Used  Substance Use Topics  . Alcohol use: No    Comment: rarely  . Drug use: Yes    Types: Marijuana    Comment: last use 03/25/17     Allergies   No known allergies   Review of Systems Review of Systems  Constitutional: Positive for appetite change and fever. Negative for chills.  HENT: Positive for congestion and sore throat.   Respiratory: Positive for cough, chest tightness and shortness of breath.   Cardiovascular: Negative for chest pain, palpitations and leg swelling.  Gastrointestinal: Negative for abdominal pain, diarrhea, nausea and vomiting.  Genitourinary: Negative for dysuria, flank pain, pelvic pain, vaginal bleeding, vaginal discharge and vaginal  pain.  Musculoskeletal: Negative for arthralgias, myalgias, neck pain and neck stiffness.  Skin: Negative for rash.  Neurological: Positive for headaches. Negative for dizziness and weakness.  All other systems reviewed and are negative.    Physical Exam Updated Vital Signs BP (!) 146/86 (BP Location: Right Arm)   Pulse (!) 110   Temp 98.9 F (37.2 C) (Oral)   Resp 18   SpO2 98%   Physical Exam  Constitutional: She is oriented to person, place, and time.  She appears well-developed and well-nourished. No distress.  HENT:  Head: Normocephalic.  Right Ear: External ear normal.  Left Ear: External ear normal.  Mouth/Throat: Oropharynx is clear and moist.  Nasal congestion present  Eyes: Conjunctivae are normal.  Neck: Neck supple.  Cardiovascular: Normal rate, regular rhythm and normal heart sounds.  Pulmonary/Chest: Effort normal. No respiratory distress. She has wheezes. She has no rales.  Inspiratory and expiratory wheezes bilaterally  Abdominal: Soft. Bowel sounds are normal. She exhibits no distension. There is no tenderness. There is no rebound.  Musculoskeletal: She exhibits no edema.  Neurological: She is alert and oriented to person, place, and time.  Skin: Skin is warm and dry.  Psychiatric: She has a normal mood and affect. Her behavior is normal.  Nursing note and vitals reviewed.    ED Treatments / Results  Labs (all labs ordered are listed, but only abnormal results are displayed) Labs Reviewed  CBC WITH DIFFERENTIAL/PLATELET - Abnormal; Notable for the following components:      Result Value   RBC 3.56 (*)    Hemoglobin 11.0 (*)    HCT 33.4 (*)    Lymphs Abs 0.3 (*)    All other components within normal limits  COMPREHENSIVE METABOLIC PANEL - Abnormal; Notable for the following components:   Potassium 3.2 (*)    Glucose, Bld 113 (*)    Creatinine, Ser 1.07 (*)    Calcium 8.7 (*)    GFR calc non Af Amer 57 (*)    All other components within normal  limits  I-STAT CG4 LACTIC ACID, ED    EKG  EKG Interpretation None       Radiology Dg Chest 2 View  Result Date: 12/17/2017 CLINICAL DATA:  Pt states she was diagnosed with the flu yesterday. Pt states symptoms started yesterday with SOb and a productive cough. Pt states her primary MD thinks she "might be septic". PT HX: left Breast CA, HTN, asthma EXAM: CHEST - 2 VIEW COMPARISON:  09/30/2017 FINDINGS: Heart, mediastinum and hila are unremarkable. The lungs are clear.  No pleural effusion or pneumothorax. Right anterior chest wall Port-A-Cath is stable and well positioned. Stable changes from left breast surgery. Skeletal structures are demineralized but otherwise unremarkable. IMPRESSION: No active cardiopulmonary disease. Electronically Signed   By: Lajean Manes M.D.   On: 12/17/2017 16:18    Procedures Procedures (including critical care time)  Medications Ordered in ED Medications  sodium chloride 0.9 % bolus 1,000 mL (not administered)  ipratropium-albuterol (DUONEB) 0.5-2.5 (3) MG/3ML nebulizer solution 3 mL (not administered)     Initial Impression / Assessment and Plan / ED Course  I have reviewed the triage vital signs and the nursing notes.  Pertinent labs & imaging results that were available during my care of the patient were reviewed by me and considered in my medical decision making (see chart for details).     Pt in ED with influeza, tested positive yesterday. Will check labs, get CXR, iv fluids ordered. Will give a breathing treatment for wheezing.  7:09 PM Pt is feeling much better after 2L of NS, tussionex, toradol, tylenol.  She is not vomiting. States cough medication really helped. She would like to go home. Still mildly tachycardic at 100-105 on the monitor. BP however is normal. No hypoxic or tacheipneic. Do not suspect sepsis. Plan to dc home with cough medication. Follow up with pcp in 2-3 days. Return precautions discussed.    Vitals:   12/17/17  1621 12/17/17 1902  BP:  134/62  Pulse:  (!) 104  Resp:  18  Temp:  99.1 F (37.3 C)  SpO2: 98% 99%     Final Clinical Impressions(s) / ED Diagnoses   Final diagnoses:  Influenza  Cough  Hypokalemia    ED Discharge Orders        Ordered    chlorpheniramine-HYDROcodone (TUSSIONEX PENNKINETIC ER) 10-8 MG/5ML SUER  Every 12 hours PRN     12/17/17 1913       Jeannett Senior, PA-C 12/17/17 2236    Daleen Bo, MD 12/17/17 2306

## 2017-12-17 NOTE — ED Triage Notes (Signed)
Pt reports that she was diagnosed with flu. Today having fevers and high heart rate and was told to go to ED for possible sepsis. Patient having headache and body aches and fatigue with chills. Taking Tamiflu and acetaminophen patient has dry hacking cough

## 2017-12-17 NOTE — Discharge Instructions (Signed)
Continue to drink plenty of fluids.  Continue Tamiflu, steroids, inhaler.  Take Tussionex as prescribed as needed for cough.  Please follow-up with family doctor next week as needed.  Return if worsening symptoms.

## 2017-12-30 ENCOUNTER — Inpatient Hospital Stay: Payer: BC Managed Care – PPO

## 2017-12-30 VITALS — BP 120/68 | HR 81 | Temp 98.8°F | Resp 17 | Wt 197.5 lb

## 2017-12-30 DIAGNOSIS — C50512 Malignant neoplasm of lower-outer quadrant of left female breast: Secondary | ICD-10-CM

## 2017-12-30 DIAGNOSIS — Z170 Estrogen receptor positive status [ER+]: Principal | ICD-10-CM

## 2017-12-30 DIAGNOSIS — Z5112 Encounter for antineoplastic immunotherapy: Secondary | ICD-10-CM | POA: Diagnosis not present

## 2017-12-30 MED ORDER — SODIUM CHLORIDE 0.9 % IV SOLN
Freq: Once | INTRAVENOUS | Status: AC
  Administered 2017-12-30: 08:00:00 via INTRAVENOUS

## 2017-12-30 MED ORDER — SODIUM CHLORIDE 0.9% FLUSH
10.0000 mL | INTRAVENOUS | Status: DC | PRN
  Administered 2017-12-30: 10 mL
  Filled 2017-12-30: qty 10

## 2017-12-30 MED ORDER — HEPARIN SOD (PORK) LOCK FLUSH 100 UNIT/ML IV SOLN
500.0000 [IU] | Freq: Once | INTRAVENOUS | Status: AC | PRN
  Administered 2017-12-30: 500 [IU]
  Filled 2017-12-30: qty 5

## 2017-12-30 MED ORDER — ACETAMINOPHEN 325 MG PO TABS
650.0000 mg | ORAL_TABLET | Freq: Once | ORAL | Status: AC
  Administered 2017-12-30: 650 mg via ORAL

## 2017-12-30 MED ORDER — DIPHENHYDRAMINE HCL 25 MG PO CAPS
ORAL_CAPSULE | ORAL | Status: AC
  Filled 2017-12-30: qty 2

## 2017-12-30 MED ORDER — ACETAMINOPHEN 325 MG PO TABS
ORAL_TABLET | ORAL | Status: AC
  Filled 2017-12-30: qty 2

## 2017-12-30 MED ORDER — TRASTUZUMAB CHEMO 150 MG IV SOLR
6.0000 mg/kg | Freq: Once | INTRAVENOUS | Status: AC
  Administered 2017-12-30: 504 mg via INTRAVENOUS
  Filled 2017-12-30: qty 24

## 2017-12-30 MED ORDER — DIPHENHYDRAMINE HCL 25 MG PO CAPS
50.0000 mg | ORAL_CAPSULE | Freq: Once | ORAL | Status: AC
  Administered 2017-12-30: 50 mg via ORAL

## 2017-12-30 NOTE — Patient Instructions (Addendum)
[facility] Discharge Instructions for Patients Receiving Chemotherapy  Today you received the following chemotherapy agents Herceptin  To help prevent nausea and vomiting after your treatment, we encourage you to take your nausea medication as directed   If you develop nausea and vomiting that is not controlled by your nausea medication, call the clinic.   BELOW ARE SYMPTOMS THAT SHOULD BE REPORTED IMMEDIATELY:  *FEVER GREATER THAN 100.5 F  *CHILLS WITH OR WITHOUT FEVER  NAUSEA AND VOMITING THAT IS NOT CONTROLLED WITH YOUR NAUSEA MEDICATION  *UNUSUAL SHORTNESS OF BREATH  *UNUSUAL BRUISING OR BLEEDING  TENDERNESS IN MOUTH AND THROAT WITH OR WITHOUT PRESENCE OF ULCERS  *URINARY PROBLEMS  *BOWEL PROBLEMS  UNUSUAL RASH Items with * indicate a potential emergency and should be followed up as soon as possible.  Feel free to call the clinic should you have any questions or concerns. The clinic phone number is [phone].  Please show the CHEMO ALERT CARD at check-in to the Emergency Department and triage nurse.   

## 2018-01-06 ENCOUNTER — Ambulatory Visit
Admission: RE | Admit: 2018-01-06 | Discharge: 2018-01-06 | Disposition: A | Discharge status: Home / Self Care | Payer: BC Managed Care – PPO | Source: Ambulatory Visit | Attending: Adult Health | Admitting: Adult Health

## 2018-01-06 DIAGNOSIS — E2839 Other primary ovarian failure: Secondary | ICD-10-CM

## 2018-01-09 ENCOUNTER — Telehealth: Payer: Self-pay

## 2018-01-09 NOTE — Telephone Encounter (Signed)
LVM for patient to call back for bd results.

## 2018-01-09 NOTE — Telephone Encounter (Signed)
-----   Message from Gardenia Phlegm, NP sent at 01/09/2018  8:43 AM EDT ----- Bone density normal, please notify patient ----- Message ----- From: Interface, Rad Results In Sent: 01/06/2018   3:50 PM To: Gardenia Phlegm, NP

## 2018-01-20 ENCOUNTER — Inpatient Hospital Stay: Payer: BC Managed Care – PPO

## 2018-01-20 ENCOUNTER — Inpatient Hospital Stay: Payer: BC Managed Care – PPO | Attending: Hematology and Oncology

## 2018-01-20 ENCOUNTER — Inpatient Hospital Stay (HOSPITAL_BASED_OUTPATIENT_CLINIC_OR_DEPARTMENT_OTHER): Payer: BC Managed Care – PPO | Admitting: Adult Health

## 2018-01-20 ENCOUNTER — Encounter: Payer: Self-pay | Admitting: Adult Health

## 2018-01-20 VITALS — BP 129/79 | HR 80 | Temp 98.1°F | Resp 18 | Ht 66.0 in | Wt 193.0 lb

## 2018-01-20 DIAGNOSIS — Z95828 Presence of other vascular implants and grafts: Secondary | ICD-10-CM

## 2018-01-20 DIAGNOSIS — Z170 Estrogen receptor positive status [ER+]: Principal | ICD-10-CM

## 2018-01-20 DIAGNOSIS — N61 Mastitis without abscess: Secondary | ICD-10-CM

## 2018-01-20 DIAGNOSIS — Z5112 Encounter for antineoplastic immunotherapy: Secondary | ICD-10-CM | POA: Insufficient documentation

## 2018-01-20 DIAGNOSIS — C50512 Malignant neoplasm of lower-outer quadrant of left female breast: Secondary | ICD-10-CM

## 2018-01-20 DIAGNOSIS — Z79811 Long term (current) use of aromatase inhibitors: Secondary | ICD-10-CM | POA: Diagnosis not present

## 2018-01-20 LAB — COMPREHENSIVE METABOLIC PANEL
ALBUMIN: 3.7 g/dL (ref 3.5–5.0)
ALK PHOS: 80 U/L (ref 40–150)
ALT: 13 U/L (ref 0–55)
ANION GAP: 7 (ref 3–11)
AST: 15 U/L (ref 5–34)
BUN: 14 mg/dL (ref 7–26)
CHLORIDE: 106 mmol/L (ref 98–109)
CO2: 28 mmol/L (ref 22–29)
Calcium: 9.4 mg/dL (ref 8.4–10.4)
Creatinine, Ser: 1.07 mg/dL (ref 0.60–1.10)
GFR calc non Af Amer: 57 mL/min — ABNORMAL LOW (ref >60)
GLUCOSE: 74 mg/dL (ref 70–140)
POTASSIUM: 4.3 mmol/L (ref 3.5–5.1)
SODIUM: 141 mmol/L (ref 136–145)
Total Bilirubin: 0.3 mg/dL (ref 0.2–1.2)
Total Protein: 6.5 g/dL (ref 6.4–8.3)

## 2018-01-20 LAB — CBC WITH DIFFERENTIAL/PLATELET
BASOS PCT: 0 %
Basophils Absolute: 0 10*3/uL (ref 0.0–0.1)
EOS ABS: 0.1 10*3/uL (ref 0.0–0.5)
EOS PCT: 4 %
HCT: 38.3 % (ref 34.8–46.6)
HEMOGLOBIN: 12.7 g/dL (ref 11.6–15.9)
LYMPHS ABS: 0.9 10*3/uL (ref 0.9–3.3)
Lymphocytes Relative: 33 %
MCH: 30.8 pg (ref 25.1–34.0)
MCHC: 33.2 g/dL (ref 31.5–36.0)
MCV: 93 fL (ref 79.5–101.0)
Monocytes Absolute: 0.3 10*3/uL (ref 0.1–0.9)
Monocytes Relative: 10 %
NEUTROS PCT: 53 %
Neutro Abs: 1.5 10*3/uL (ref 1.5–6.5)
PLATELETS: 224 10*3/uL (ref 145–400)
RBC: 4.12 MIL/uL (ref 3.70–5.45)
RDW: 12.4 % (ref 11.2–14.5)
WBC: 2.9 10*3/uL — ABNORMAL LOW (ref 3.9–10.3)

## 2018-01-20 MED ORDER — TRASTUZUMAB CHEMO 150 MG IV SOLR
6.0000 mg/kg | Freq: Once | INTRAVENOUS | Status: AC
  Administered 2018-01-20: 504 mg via INTRAVENOUS
  Filled 2018-01-20: qty 24

## 2018-01-20 MED ORDER — DIPHENHYDRAMINE HCL 25 MG PO CAPS
ORAL_CAPSULE | ORAL | Status: AC
  Filled 2018-01-20: qty 2

## 2018-01-20 MED ORDER — HEPARIN SOD (PORK) LOCK FLUSH 100 UNIT/ML IV SOLN
500.0000 [IU] | Freq: Once | INTRAVENOUS | Status: AC | PRN
  Administered 2018-01-20: 500 [IU]
  Filled 2018-01-20: qty 5

## 2018-01-20 MED ORDER — ACETAMINOPHEN 325 MG PO TABS
ORAL_TABLET | ORAL | Status: AC
  Filled 2018-01-20: qty 2

## 2018-01-20 MED ORDER — ACETAMINOPHEN 325 MG PO TABS
650.0000 mg | ORAL_TABLET | Freq: Once | ORAL | Status: AC
  Administered 2018-01-20: 650 mg via ORAL

## 2018-01-20 MED ORDER — SODIUM CHLORIDE 0.9% FLUSH
10.0000 mL | INTRAVENOUS | Status: DC | PRN
  Administered 2018-01-20: 10 mL
  Filled 2018-01-20: qty 10

## 2018-01-20 MED ORDER — SODIUM CHLORIDE 0.9 % IV SOLN
Freq: Once | INTRAVENOUS | Status: AC
  Administered 2018-01-20: 10:00:00 via INTRAVENOUS

## 2018-01-20 MED ORDER — SODIUM CHLORIDE 0.9% FLUSH
10.0000 mL | Freq: Once | INTRAVENOUS | Status: AC
  Administered 2018-01-20: 10 mL
  Filled 2018-01-20: qty 10

## 2018-01-20 MED ORDER — DIPHENHYDRAMINE HCL 25 MG PO CAPS
50.0000 mg | ORAL_CAPSULE | Freq: Once | ORAL | Status: AC
  Administered 2018-01-20: 50 mg via ORAL

## 2018-01-20 MED ORDER — CEPHALEXIN 500 MG PO CAPS: 500.0000 mg | ORAL_CAPSULE | Freq: Four times a day (QID) | ORAL | 0 refills | Status: DC

## 2018-01-20 NOTE — Patient Instructions (Signed)
[facility] Discharge Instructions for Patients Receiving Chemotherapy  Today you received the following chemotherapy agents Herceptin  To help prevent nausea and vomiting after your treatment, we encourage you to take your nausea medication as directed   If you develop nausea and vomiting that is not controlled by your nausea medication, call the clinic.   BELOW ARE SYMPTOMS THAT SHOULD BE REPORTED IMMEDIATELY:  *FEVER GREATER THAN 100.5 F  *CHILLS WITH OR WITHOUT FEVER  NAUSEA AND VOMITING THAT IS NOT CONTROLLED WITH YOUR NAUSEA MEDICATION  *UNUSUAL SHORTNESS OF BREATH  *UNUSUAL BRUISING OR BLEEDING  TENDERNESS IN MOUTH AND THROAT WITH OR WITHOUT PRESENCE OF ULCERS  *URINARY PROBLEMS  *BOWEL PROBLEMS  UNUSUAL RASH Items with * indicate a potential emergency and should be followed up as soon as possible.  Feel free to call the clinic should you have any questions or concerns. The clinic phone number is [phone].  Please show the CHEMO ALERT CARD at check-in to the Emergency Department and triage nurse.   

## 2018-01-20 NOTE — Progress Notes (Signed)
[facility] Follow up:    Haley Melter, MD [address] [place] [address]   DIAGNOSIS: Cancer Staging Malignant neoplasm of lower-outer quadrant of left breast of female, estrogen receptor positive ([place]) Staging form: Breast, AJCC 8th Edition - Clinical stage from 03/09/2017: Stage IB (cT2, cN0, cM0, G3, ER: Positive, PR: Positive, HER2: Positive) - Unsigned Staging comments: Staged at breast conference on 5.30.18 - Pathologic: No Stage Recommended (ypT1c, pN0, cM0, G2, ER: Positive, PR: Positive, HER2: Negative) - Signed by Gardenia Phlegm, NP on 08/03/2017   SUMMARY OF ONCOLOGIC HISTORY:   Malignant neoplasm of lower-outer quadrant of left breast of female, estrogen receptor positive ([place])   03/01/2017 Initial Diagnosis    Left breast 2:30 position: 2.1 cm irregular solid mass, additional 0.8 cm oval mass in the left breast (fibrocystic); primary mass on biopsy revealed IDC grade 3, ER 100%, PR 80%, Ki-67 25%, HER-2 positive ratio 1.88, copy #6.2, T2 N0 stage II a clinical stage      03/29/2017 - 06/14/2017 Neo-Adjuvant Chemotherapy    Neoadjuvant chemotherapy with 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  The Multi-Gene Panel offered by [facility] includes sequencing and/or deletion duplication testing of the following 80 genes: ALK, APC, ATM, AXIN2,BAP1,  BARD1, BLM, BMPR1A, BRCA1, BRCA2, BRIP1, CASR, CDC73, CDH1, CDK4, CDKN1B, CDKN1C, CDKN2A (p14ARF), CDKN2A (p16INK4a), CEBPA, CHEK2, CTNNA1, DICER1, DIS3L2, EGFR (c.2369C>T, p.Thr790Met variant only), EPCAM (Deletion/duplication testing only), FH, FLCN, GATA2, GPC3, GREM1 (Promoter region deletion/duplication testing only), HOXB13 (c.251G>A, p.Gly84Glu), HRAS, KIT, MAX, MEN1, MET, MITF (c.952G>A, p.Glu318Lys variant only), MLH1, MSH2, MSH3, MSH6, MUTYH, NBN, NF1, NF2, NTHL1, PALB2, PDGFRA, PHOX2B, PMS2,  POLD1, POLE, POT1, PRKAR1A, PTCH1, PTEN, RAD50, RAD51C, RAD51D, RB1, RECQL4, RET, RUNX1, SDHAF2, SDHA (sequence changes only), SDHB, SDHC, SDHD, SMAD4, SMARCA4, SMARCB1, SMARCE1, STK11, SUFU, TERT, TERT, TMEM127, TP53, TSC1, TSC2, VHL, WRN and WT1.  The report date is April 27, 2017.       07/21/2017 Surgery    Left lumpectomy: Grade 2 IDC 1.9 cm with DCIS, margins negative, 0/4 lymph nodes negative, ER 100%, PR 80%, HER-2 positive, Ki-67 25%, RCB class II,T1c N0 stage IA      08/29/2017 - 10/03/2017 Radiation Therapy    Adjuvant radiation:1. Left breast, 2.67 Gy in 15 fractions for a total dose of 40.05 Gy  2. Boost, 2 Gy in 5 fractions for a total dose of 10 Gy         11/25/2017 -  Anti-estrogen oral therapy    Letrozole daily       CURRENT THERAPY: Herceptin and Letrozole  INTERVAL HISTORY: Haley Sanchez 57 y.o. female returns for evaluation of her left sided triple positive prior to receiving Herceptin treatment today.  She is taking Letrozole daily and is tolerating it well.  She is concerned about her left breast being red, swollen, warm and tender.  She wants to know if it is normal.  She underwent bone density test on 01/06/18 that was normal.     Patient Active Problem List   Diagnosis Date Noted  . Cystitis 05/06/2017  . Hematuria 05/05/2017  . Genetic testing 04/29/2017  . Family history of breast cancer   . Family history of prostate cancer   . Port catheter in place 04/05/2017  . Malignant neoplasm of lower-outer quadrant of left breast of female, estrogen receptor positive ([place]) 03/03/2017  is allergic to no known allergies.  MEDICAL HISTORY: Past Medical History:  Diagnosis Date  . Anxiety   . Asthma   . Cancer [facility])    breast cancer  . Complication of anesthesia   . Depression   . Family history of breast cancer   . Family history of prostate cancer   . History of radiation therapy 08/29/2017- 10/03/17   Left Breast 40.05 Gy in 15 fractions followed  by a boost of 10 Gy per fraction.   . Hypertension   . PONV (postoperative nausea and vomiting)   . Seasonal allergies     SURGICAL HISTORY: Past Surgical History:  Procedure Laterality Date  . BREAST LUMPECTOMY WITH RADIOACTIVE SEED AND SENTINEL LYMPH NODE BIOPSY Left 07/21/2017   Procedure: BREAST LUMPECTOMY WITH RADIOACTIVE SEED AND SENTINEL LYMPH NODE BIOPSY;  Surgeon: Rolm Bookbinder, MD;  Location: [facility];  Service: General;  Laterality: Left;  . PORTACATH PLACEMENT Right 03/28/2017   Procedure: INSERTION PORT-A-CATH WITH [place];  Surgeon: Rolm Bookbinder, MD;  Location: [facility];  Service: General;  Laterality: Right;    SOCIAL HISTORY: Social History   Socioeconomic History  . Marital status: Divorced    Spouse name: Not on file  . Number of children: Not on file  . Years of education: Not on file  . Highest education level: Not on file  Occupational History  . Not on file  Social Needs  . Financial resource strain: Not on file  . Food insecurity:    Worry: Not on file    Inability: Not on file  . Transportation needs:    Medical: Not on file    Non-medical: Not on file  Tobacco Use  . Smoking status: Never Smoker  . Smokeless tobacco: Never Used  Substance and Sexual Activity  . Alcohol use: No    Comment: rarely  . Drug use: Yes    Types: Marijuana    Comment: last use 03/25/17  . Sexual activity: Not Currently  Lifestyle  . Physical activity:    Days per week: Not on file    Minutes per session: Not on file  . Stress: Not on file  Relationships  . Social connections:    Talks on phone: Not on file    Gets together: Not on file    Attends religious service: Not on file    Active member of club or organization: Not on file    Attends meetings of clubs or organizations: Not on file    Relationship status: Not on file  . Intimate partner violence:    Fear of current or ex partner: Not on file    Emotionally abused: Not on file     Physically abused: Not on file    Forced sexual activity: Not on file  Other Topics Concern  . Not on file  Social History Narrative  . Not on file    FAMILY HISTORY: Family History  Problem Relation Age of Onset  . Heart disease Mother   . Hypertension Mother   . Kidney failure Father   . Diabetes Father   . Hypertension Father   . Prostate cancer Father 39  . Brain cancer Maternal Grandmother 36  . Prostate cancer Maternal Grandfather        dx in his 28s  . Breast cancer Paternal Grandmother   . Alcohol abuse Maternal Aunt   . Other Maternal Uncle        died in [place]  . Breast  cancer Cousin        maternal first cousin dx in her 63s  . Mental retardation Cousin   . Breast cancer Other        maternal second cousin  . Breast cancer Cousin        paternal first cousin  . Breast cancer Cousin        paternal first cousin    Review of Systems  Constitutional: Negative for appetite change, chills, fatigue, fever and unexpected weight change.  HENT:   Negative for hearing loss, lump/mass, sore throat and trouble swallowing.   Eyes: Negative for eye problems and icterus.  Respiratory: Negative for chest tightness, cough and shortness of breath.   Cardiovascular: Negative for chest pain, leg swelling and palpitations.  Gastrointestinal: Negative for abdominal distention, abdominal pain, constipation, diarrhea, nausea and vomiting.  Endocrine: Negative for hot flashes.  Musculoskeletal: Negative for arthralgias.  Skin: Negative for itching and rash.  Neurological: Negative for dizziness, extremity weakness, headaches and numbness.  Hematological: Negative for adenopathy. Does not bruise/bleed easily.  Psychiatric/Behavioral: Negative for depression. The patient is not nervous/anxious.       PHYSICAL EXAMINATION  ECOG PERFORMANCE STATUS: 1 - Symptomatic but completely ambulatory  Vitals:   01/20/18 0900  BP: 129/79  Pulse: 80  Resp: 18  Temp: 98.1 F (36.7 C)   SpO2: 100%    Physical Exam  Constitutional: She is oriented to person, place, and time and well-developed, well-nourished, and in no distress.  HENT:  Head: Normocephalic and atraumatic.  Mouth/Throat: Oropharynx is clear and moist. No oropharyngeal exudate.  Eyes: Pupils are equal, round, and reactive to light. No scleral icterus.  Neck: Neck supple.  Cardiovascular: Normal rate, regular rhythm and normal heart sounds.  Pulmonary/Chest: Effort normal and breath sounds normal.  Left breast with erythema, tenderness, slight swelling, and slight warmth  Abdominal: Soft. Bowel sounds are normal. She exhibits no distension and no mass. There is no tenderness. There is no rebound and no guarding.  Musculoskeletal: She exhibits no edema.  Lymphadenopathy:    She has no cervical adenopathy.  Neurological: She is alert and oriented to person, place, and time.  Skin: Skin is warm and dry. No rash noted.  Psychiatric: Mood and affect normal.    LABORATORY DATA:  CBC    Component Value Date/Time   WBC 2.9 (L) 01/20/2018 0835   RBC 4.12 01/20/2018 0835   HGB 12.7 01/20/2018 0835   HGB 13.4 09/16/2017 1314   HCT 38.3 01/20/2018 0835   HCT 39.5 09/16/2017 1314   PLT 224 01/20/2018 0835   PLT 236 09/16/2017 1314   MCV 93.0 01/20/2018 0835   MCV 91.0 09/16/2017 1314   MCH 30.8 01/20/2018 0835   MCHC 33.2 01/20/2018 0835   RDW 12.4 01/20/2018 0835   RDW 12.9 09/16/2017 1314   LYMPHSABS 0.9 01/20/2018 0835   LYMPHSABS 0.9 09/16/2017 1314   MONOABS 0.3 01/20/2018 0835   MONOABS 0.8 09/16/2017 1314   EOSABS 0.1 01/20/2018 0835   EOSABS 0.0 09/16/2017 1314   BASOSABS 0.0 01/20/2018 0835   BASOSABS 0.0 09/16/2017 1314    CMP     Component Value Date/Time   NA 141 01/20/2018 0835   NA 139 09/16/2017 1314   K 4.3 01/20/2018 0835   K 4.0 09/16/2017 1314   CL 106 01/20/2018 0835   CO2 28 01/20/2018 0835   CO2 22 09/16/2017 1314   GLUCOSE 74 01/20/2018 0835   GLUCOSE 84  09/16/2017 1314  BUN 14 01/20/2018 0835   BUN 22.5 09/16/2017 1314   CREATININE 1.07 01/20/2018 0835   CREATININE 1.2 (H) 09/16/2017 1314   CALCIUM 9.4 01/20/2018 0835   CALCIUM 8.5 09/16/2017 1314   PROT 6.5 01/20/2018 0835   PROT 6.3 (L) 09/16/2017 1314   ALBUMIN 3.7 01/20/2018 0835   ALBUMIN 3.5 09/16/2017 1314   AST 15 01/20/2018 0835   AST 13 09/16/2017 1314   ALT 13 01/20/2018 0835   ALT 16 09/16/2017 1314   ALKPHOS 80 01/20/2018 0835   ALKPHOS 84 09/16/2017 1314   BILITOT 0.3 01/20/2018 0835   BILITOT 0.25 09/16/2017 1314   GFRNONAA 57 (L) 01/20/2018 0835   GFRAA >60 01/20/2018 0835           ASSESSMENT and PLAN:   Malignant neoplasm of lower-outer quadrant of left breast of female, estrogen receptor positive ([place]) Left breast 2:30 position: 2.1 cm irregular solid mass, additional 0.8 cm oval mass in the left breast (fibrocystic); primary mass on biopsy revealed IDC grade 3, ER 100%, PR 80%, Ki-67 25%, HER-2 positive ratio 1.88, copy #6.2, T2 N0 stage II a clinical stage  Treatment summary: 1. Neoadjuvant Taxol Herceptin weekly 11 completed 09/04/2018followed by Herceptin maintenance x 6 months completed 02/2018 2. breast conserving surgery with sentinel lymph node biopsy done on 07/21/2017 07/21/2017 Left lumpectomy: Grade 2 IDC 1.9 cm with DCIS, margins negative, 0/4 lymph nodes negative, ER 100%, PR 80%, HER-2 positive, Ki-67 25%, RCB class II,T1c N0 stage IA 3. Adjuvant radiation therapy finished on 10/03/17 4. Letrozole started 11/25/17 (bone density 01/06/2018 was normal)  Current treatment:  Haley Sanchez is doing well today.  She will proceed with Herceptin today.  Her last echocardiogram was in February and was normal.   She will continue Letrozole.  Her bone density was normal.  She will undergo mammogram in June at [facility].  For her left breast I will put her on Keflex to see if that helps.  If not, we may need to do mammogram of her left breast sooner to  evaluate, and perhaps refer to physical therapy.    Haley Sanchez will return in 3 weeks for her next Herceptin.     Orders Placed This Encounter  Procedures  . MM DIAG BREAST TOMO BILATERAL    Standing Status:   Future    Standing Expiration Date:   03/23/2019    Order Specific Question:   Reason for Exam (SYMPTOM  OR DIAGNOSIS REQUIRED)    Answer:   breast cancer    Order Specific Question:   Preferred imaging location?    Answer:   External    Comments:   [facility]    Order Specific Question:   Is the patient pregnant?    Answer:   No    All questions were answered. The patient knows to call the clinic with any problems, questions or concerns. We can certainly see the patient much sooner if necessary.  A total of (30) minutes of face-to-face time was spent with this patient with greater than 50% of that time in counseling and care-coordination.  This note was electronically signed. Scot Dock, NP 01/20/2018

## 2018-01-20 NOTE — Assessment & Plan Note (Addendum)
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 Neoadjuvant Taxol Herceptin weekly 11 completed 09/04/2018followed by Herceptin maintenance x 6 months completed 02/2018 2. breast conserving surgery with sentinel lymph node biopsy done on 07/21/2017 07/21/2017 Left lumpectomy: Grade 2 IDC 1.9 cm with DCIS, margins negative, 0/4 lymph nodes negative, ER 100%, PR 80%, HER-2 positive, Ki-67 25%, RCB class II,T1c N0 stage IA 3. Adjuvant radiation therapy finished on 10/03/17 4. Letrozole started 11/25/17 (bone density 01/06/2018 was normal)  Current treatment:  Haley Sanchez is doing well today.  She will proceed with Herceptin today.  Her last echocardiogram was in February and was normal.   She will continue Letrozole.  Her bone density was normal.  She will undergo mammogram in June at [facility].  For her left breast I will put her on Keflex to see if that helps.  If not, we may need to do mammogram of her left breast sooner to evaluate, and perhaps refer to physical therapy.    Haley Sanchez will return in 3 weeks for her next Herceptin.

## 2018-01-23 ENCOUNTER — Telehealth: Payer: Self-pay

## 2018-01-23 ENCOUNTER — Telehealth: Payer: Self-pay | Admitting: Hematology and Oncology

## 2018-01-23 ENCOUNTER — Other Ambulatory Visit: Payer: Self-pay | Admitting: Adult Health

## 2018-01-23 DIAGNOSIS — Z170 Estrogen receptor positive status [ER+]: Principal | ICD-10-CM

## 2018-01-23 DIAGNOSIS — C50512 Malignant neoplasm of lower-outer quadrant of left female breast: Secondary | ICD-10-CM

## 2018-01-23 MED ORDER — LETROZOLE 2.5 MG PO TABS: 2.5000 mg | ORAL_TABLET | Freq: Every day | ORAL | 5 refills | Status: DC

## 2018-01-23 NOTE — Telephone Encounter (Signed)
I called and left a message to discuss her treatment plan. Currently her Herceptin was written for only 6 months.  But I want to discuss with her about continuation of Herceptin for 1 year. I left her our phone number to call us back.

## 2018-01-23 NOTE — Telephone Encounter (Signed)
Returned pt VM and left message for patient regarding reasoning's for keflex antibiotic. She was put on this preventively for infection vs. Lymphedema. If the antibiotic is not working she is to call so we can schedule her with our lymphedema clinic and a mammogram. Return number left in VM.  Cyndia Bent RN

## 2018-01-25 ENCOUNTER — Telehealth: Payer: Self-pay | Admitting: Hematology and Oncology

## 2018-01-25 NOTE — Telephone Encounter (Signed)
Left another message for me in the that I would like her to continue Herceptin for 1 year instead of 6 months.

## 2018-02-01 ENCOUNTER — Telehealth: Payer: Self-pay | Admitting: Hematology and Oncology

## 2018-02-01 NOTE — Telephone Encounter (Signed)
Left message for patient regarding upcoming may appointments per 4/23 sch message

## 2018-02-03 ENCOUNTER — Encounter (HOSPITAL_COMMUNITY): Payer: Self-pay | Admitting: Internal Medicine

## 2018-02-03 ENCOUNTER — Other Ambulatory Visit: Payer: Self-pay

## 2018-02-03 ENCOUNTER — Ambulatory Visit (HOSPITAL_COMMUNITY)
Admission: RE | Admit: 2018-02-03 | Discharge: 2018-02-03 | Disposition: A | Discharge status: Home / Self Care | Payer: BC Managed Care – PPO | Source: Ambulatory Visit | Attending: Internal Medicine | Admitting: Internal Medicine

## 2018-02-03 ENCOUNTER — Ambulatory Visit (HOSPITAL_BASED_OUTPATIENT_CLINIC_OR_DEPARTMENT_OTHER)
Admission: RE | Admit: 2018-02-03 | Discharge: 2018-02-03 | Disposition: A | Discharge status: Home / Self Care | Payer: BC Managed Care – PPO | Source: Ambulatory Visit | Attending: Internal Medicine | Admitting: Internal Medicine

## 2018-02-03 VITALS — BP 136/86 | HR 67 | Wt 192.0 lb

## 2018-02-03 DIAGNOSIS — Z79899 Other long term (current) drug therapy: Secondary | ICD-10-CM | POA: Insufficient documentation

## 2018-02-03 DIAGNOSIS — Z170 Estrogen receptor positive status [ER+]: Secondary | ICD-10-CM | POA: Diagnosis not present

## 2018-02-03 DIAGNOSIS — I071 Rheumatic tricuspid insufficiency: Secondary | ICD-10-CM | POA: Insufficient documentation

## 2018-02-03 DIAGNOSIS — C50412 Malignant neoplasm of upper-outer quadrant of left female breast: Secondary | ICD-10-CM | POA: Diagnosis not present

## 2018-02-03 DIAGNOSIS — J45909 Unspecified asthma, uncomplicated: Secondary | ICD-10-CM | POA: Diagnosis not present

## 2018-02-03 DIAGNOSIS — I10 Essential (primary) hypertension: Secondary | ICD-10-CM | POA: Diagnosis not present

## 2018-02-03 DIAGNOSIS — F329 Major depressive disorder, single episode, unspecified: Secondary | ICD-10-CM | POA: Insufficient documentation

## 2018-02-03 DIAGNOSIS — F419 Anxiety disorder, unspecified: Secondary | ICD-10-CM | POA: Diagnosis not present

## 2018-02-03 MED ORDER — SPIRONOLACTONE 25 MG PO TABS: 25.0000 mg | ORAL_TABLET | Freq: Every day | ORAL | 6 refills | Status: DC

## 2018-02-03 NOTE — Progress Notes (Signed)
  Echocardiogram 2D Echocardiogram has been performed.  Haley Sanchez 02/03/2018, 11:58 AM

## 2018-02-03 NOTE — Patient Instructions (Signed)
Your physician recommends that you schedule a follow-up appointment in: 3 months with echocardiogram  

## 2018-02-03 NOTE — Addendum Note (Signed)
Encounter addended by: Scarlette Calico, RN on: 02/03/2018 12:50 PM  Actions taken: Diagnosis association updated, Order list changed

## 2018-02-03 NOTE — Addendum Note (Signed)
Encounter addended by: Scarlette Calico, RN on: 02/03/2018 12:48 PM  Actions taken: Sign clinical note

## 2018-02-03 NOTE — Progress Notes (Signed)
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 Lindi Adie  for enrollment into the Cardio-Oncology program.  SUMMARY OF ONCOLOGIC HISTORY:       Malignant neoplasm of lower-outer quadrant of left breast of female, estrogen receptor positive ([place])   03/01/2017 Initial Diagnosis    Left breast 2:30 position: 2.1 cm irregular solid mass, additional 0.8 cm oval mass in the left breast (fibrocystic); primary mass on biopsy revealed IDC grade 3, ER 100%, PR 80%, Ki-67 25%, HER-2 positive ratio 1.88, copy #6.2, T2 N0 stage II a clinical stage        Therapy plan 1. Neoadjuvant Taxol Herceptin weekly 12 followed by Herceptin maintenance for 6 months 2. breast conserving surgery with sentinel lymph node biopsy 3. Adjuvant radiation therapy 4. Followed by adjuvant antiestrogen therapy  Pt has finished Taxol and XRT.  She presents today for regular follow up.  Doing well. Now 6 months into Herceptin. Back to school teaching. More energy. No orthopnea or PND. Not exercising.   ECHO today EF 55-60% Lat s 11.2. GLS -15.65 Personally reviewed  ECHO (03/18/17): 60-65%  Lat s' 9.6, -18.3% Grade II DD  ECHO (07/06/17): 60%  GLS -19.8% LS 11.3cm/s grade I DD  ECHO (10/06/17): EF 55-60% LS 11.5 GLS -16.0 (underestimated)  ECHO 11/25/17: EF 55-60%, LS' 11.1, GLS -12.8 (understimated) - Personally reviewed    Past Medical History:  Diagnosis Date  . Anxiety   . Asthma   . Cancer [facility])    breast cancer  . Complication of anesthesia   . Depression   . Family history of breast cancer   . Family history of prostate cancer   . History of radiation therapy 08/29/2017- 10/03/17   Left Breast 40.05 Gy in 15 fractions followed by a boost of 10 Gy per fraction.   . Hypertension   . PONV (postoperative nausea and vomiting)   . Seasonal  allergies     Current Outpatient Medications  Medication Sig Dispense Refill  . albuterol (PROVENTIL HFA;VENTOLIN HFA) 108 (90 Base) MCG/ACT inhaler Inhale 2 puffs into the lungs every 6 (six) hours as needed for wheezing or shortness of breath.    Marland Kitchen buPROPion (WELLBUTRIN XL) 300 MG 24 hr tablet Take 300 mg by mouth daily.    . cephALEXin (KEFLEX) 500 MG capsule Take 1 capsule (500 mg total) by mouth 4 (four) times daily. 40 capsule 0  . letrozole (FEMARA) 2.5 MG tablet Take 1 tablet (2.5 mg total) by mouth daily. 30 tablet 5  . lidocaine-prilocaine (EMLA) cream     . losartan (COZAAR) 100 MG tablet Take 100 mg by mouth daily.   0  . ondansetron (ZOFRAN) 8 MG tablet Take by mouth.    Marland Kitchen PARoxetine (PAXIL) 40 MG tablet Take 40 mg by mouth every evening.     . prochlorperazine (COMPAZINE) 10 MG tablet Take by mouth.    . spironolactone (ALDACTONE) 25 MG tablet Take 1 tablet (25 mg total) by mouth daily. 30 tablet 3  . Triamcinolone Acetonide (TRIAMCINOLONE 0.1 % CREAM : EUCERIN) CREA Apply 1 application topically 3 (three) times daily as needed. 1 each 1   No current facility-administered medications for this encounter.     Allergies  Allergen Reactions  . No Known Allergies       Social History   Socioeconomic History  . Marital  status: Divorced    Spouse name: Not on file  . Number of children: Not on file  . Years of education: Not on file  . Highest education level: Not on file  Occupational History  . Not on file  Social Needs  . Financial resource strain: Not on file  . Food insecurity:    Worry: Not on file    Inability: Not on file  . Transportation needs:    Medical: Not on file    Non-medical: Not on file  Tobacco Use  . Smoking status: Never Smoker  . Smokeless tobacco: Never Used  Substance and Sexual Activity  . Alcohol use: No    Comment: rarely  . Drug use: Yes    Types: Marijuana    Comment: last use 03/25/17  . Sexual activity: Not Currently    Lifestyle  . Physical activity:    Days per week: Not on file    Minutes per session: Not on file  . Stress: Not on file  Relationships  . Social connections:    Talks on phone: Not on file    Gets together: Not on file    Attends religious service: Not on file    Active member of club or organization: Not on file    Attends meetings of clubs or organizations: Not on file    Relationship status: Not on file  . Intimate partner violence:    Fear of current or ex partner: Not on file    Emotionally abused: Not on file    Physically abused: Not on file    Forced sexual activity: Not on file  Other Topics Concern  . Not on file  Social History Narrative  . Not on file      Family History  Problem Relation Age of Onset  . Heart disease Mother   . Hypertension Mother   . Kidney failure Father   . Diabetes Father   . Hypertension Father   . Prostate cancer Father 66  . Brain cancer Maternal Grandmother 80  . Prostate cancer Maternal Grandfather        dx in his 83s  . Breast cancer Paternal Grandmother   . Alcohol abuse Maternal Aunt   . Other Maternal Uncle        died in [place]  . Breast cancer Cousin        maternal first cousin dx in her 1s  . Mental retardation Cousin   . Breast cancer Other        maternal second cousin  . Breast cancer Cousin        paternal first cousin  . Breast cancer Cousin        paternal first cousin    Vitals:   02/03/18 1204  BP: 136/86  Pulse: 67  SpO2: 99%  Weight: 192 lb (87.1 kg)    PHYSICAL EXAM: General:  Well appearing. No resp difficulty HEENT: normal Neck: supple. no JVD. Carotids 2+ bilat; no bruits. No lymphadenopathy or thryomegaly appreciated. Cor: PMI nondisplaced. Regular rate & rhythm. No rubs, gallops or murmurs. R port a cath redness over left chest Lungs: clear Abdomen: soft, nontender, nondistended. No hepatosplenomegaly. No bruits or masses. Good bowel sounds. Extremities: no cyanosis, clubbing, rash,  edema Neuro: alert & orientedx3, cranial nerves grossly intact. moves all 4 extremities w/o difficulty. Affect pleasant   ASSESSMENT & PLAN: 1. Left breast cancer - Diagnosed 5/18 - Grade 3, ER 100%, PR 80%, Ki-67 25%, HER-2 positive ratio  1.88, copy #6.2, T2 N0 stage II a clinical stage - Tolerating Herceptin well - now 6 months in. Will get 12 months therapy.  - ECHO today personally reviewed. EF 55-60%, LS' 11.2, GLS -15.6  -I reviewed echos personally. EF and Doppler parameters stable. No HF on exam. Continue Herceptin.     2. HTN - Blood pressure well controlled. Continue current regimen.  Glori Bickers, MD  12:40 PM

## 2018-02-10 ENCOUNTER — Inpatient Hospital Stay: Payer: BC Managed Care – PPO | Attending: Hematology and Oncology

## 2018-02-10 VITALS — BP 144/95 | HR 74 | Temp 98.8°F | Resp 18

## 2018-02-10 DIAGNOSIS — Z5112 Encounter for antineoplastic immunotherapy: Secondary | ICD-10-CM | POA: Diagnosis not present

## 2018-02-10 DIAGNOSIS — Z170 Estrogen receptor positive status [ER+]: Secondary | ICD-10-CM | POA: Diagnosis not present

## 2018-02-10 DIAGNOSIS — C50512 Malignant neoplasm of lower-outer quadrant of left female breast: Secondary | ICD-10-CM | POA: Diagnosis present

## 2018-02-10 MED ORDER — HEPARIN SOD (PORK) LOCK FLUSH 100 UNIT/ML IV SOLN
500.0000 [IU] | Freq: Once | INTRAVENOUS | Status: AC | PRN
  Administered 2018-02-10: 500 [IU]
  Filled 2018-02-10: qty 5

## 2018-02-10 MED ORDER — ACETAMINOPHEN 325 MG PO TABS
ORAL_TABLET | ORAL | Status: AC
  Filled 2018-02-10: qty 2

## 2018-02-10 MED ORDER — TRASTUZUMAB CHEMO 150 MG IV SOLR
6.0000 mg/kg | Freq: Once | INTRAVENOUS | Status: AC
  Administered 2018-02-10: 504 mg via INTRAVENOUS
  Filled 2018-02-10: qty 24

## 2018-02-10 MED ORDER — SODIUM CHLORIDE 0.9% FLUSH
10.0000 mL | INTRAVENOUS | Status: DC | PRN
  Administered 2018-02-10: 10 mL
  Filled 2018-02-10: qty 10

## 2018-02-10 MED ORDER — SODIUM CHLORIDE 0.9 % IV SOLN
Freq: Once | INTRAVENOUS | Status: AC
  Administered 2018-02-10: 09:00:00 via INTRAVENOUS

## 2018-02-10 MED ORDER — ACETAMINOPHEN 325 MG PO TABS
650.0000 mg | ORAL_TABLET | Freq: Once | ORAL | Status: AC
  Administered 2018-02-10: 650 mg via ORAL

## 2018-02-10 MED ORDER — DIPHENHYDRAMINE HCL 25 MG PO CAPS
50.0000 mg | ORAL_CAPSULE | Freq: Once | ORAL | Status: AC
  Administered 2018-02-10: 50 mg via ORAL

## 2018-02-10 MED ORDER — DIPHENHYDRAMINE HCL 25 MG PO CAPS
ORAL_CAPSULE | ORAL | Status: AC
  Filled 2018-02-10: qty 2

## 2018-02-10 NOTE — Patient Instructions (Signed)
[facility] Discharge Instructions for Patients Receiving Chemotherapy  Today you received the following chemotherapy agents Herceptin  To help prevent nausea and vomiting after your treatment, we encourage you to take your nausea medication as directed   If you develop nausea and vomiting that is not controlled by your nausea medication, call the clinic.   BELOW ARE SYMPTOMS THAT SHOULD BE REPORTED IMMEDIATELY:  *FEVER GREATER THAN 100.5 F  *CHILLS WITH OR WITHOUT FEVER  NAUSEA AND VOMITING THAT IS NOT CONTROLLED WITH YOUR NAUSEA MEDICATION  *UNUSUAL SHORTNESS OF BREATH  *UNUSUAL BRUISING OR BLEEDING  TENDERNESS IN MOUTH AND THROAT WITH OR WITHOUT PRESENCE OF ULCERS  *URINARY PROBLEMS  *BOWEL PROBLEMS  UNUSUAL RASH Items with * indicate a potential emergency and should be followed up as soon as possible.  Feel free to call the clinic should you have any questions or concerns. The clinic phone number is [phone].  Please show the CHEMO ALERT CARD at check-in to the Emergency Department and triage nurse.   

## 2018-03-02 ENCOUNTER — Other Ambulatory Visit: Payer: BC Managed Care – PPO

## 2018-03-02 ENCOUNTER — Ambulatory Visit: Payer: BC Managed Care – PPO | Admitting: Hematology and Oncology

## 2018-03-02 ENCOUNTER — Encounter: Payer: Self-pay | Admitting: Adult Health

## 2018-03-02 ENCOUNTER — Inpatient Hospital Stay: Payer: BC Managed Care – PPO

## 2018-03-02 ENCOUNTER — Encounter: Payer: Self-pay | Admitting: Hematology and Oncology

## 2018-03-02 ENCOUNTER — Inpatient Hospital Stay (HOSPITAL_BASED_OUTPATIENT_CLINIC_OR_DEPARTMENT_OTHER): Payer: BC Managed Care – PPO | Admitting: Hematology and Oncology

## 2018-03-02 ENCOUNTER — Ambulatory Visit: Payer: BC Managed Care – PPO

## 2018-03-02 VITALS — BP 145/88 | HR 79 | Temp 98.3°F | Resp 18 | Ht 66.0 in | Wt 192.8 lb

## 2018-03-02 DIAGNOSIS — C50512 Malignant neoplasm of lower-outer quadrant of left female breast: Secondary | ICD-10-CM

## 2018-03-02 DIAGNOSIS — Z170 Estrogen receptor positive status [ER+]: Secondary | ICD-10-CM

## 2018-03-02 DIAGNOSIS — D709 Neutropenia, unspecified: Secondary | ICD-10-CM

## 2018-03-02 DIAGNOSIS — R21 Rash and other nonspecific skin eruption: Secondary | ICD-10-CM

## 2018-03-02 DIAGNOSIS — Z5112 Encounter for antineoplastic immunotherapy: Secondary | ICD-10-CM | POA: Diagnosis not present

## 2018-03-02 DIAGNOSIS — Z95828 Presence of other vascular implants and grafts: Secondary | ICD-10-CM

## 2018-03-02 LAB — CBC WITH DIFFERENTIAL/PLATELET
BASOS PCT: 1 %
Basophils Absolute: 0 10*3/uL (ref 0.0–0.1)
Eosinophils Absolute: 0.1 10*3/uL (ref 0.0–0.5)
Eosinophils Relative: 5 %
HEMATOCRIT: 36.5 % (ref 34.8–46.6)
Hemoglobin: 12.4 g/dL (ref 11.6–15.9)
LYMPHS PCT: 32 %
Lymphs Abs: 0.8 10*3/uL — ABNORMAL LOW (ref 0.9–3.3)
MCH: 30.4 pg (ref 25.1–34.0)
MCHC: 33.9 g/dL (ref 31.5–36.0)
MCV: 89.7 fL (ref 79.5–101.0)
MONOS PCT: 11 %
Monocytes Absolute: 0.3 10*3/uL (ref 0.1–0.9)
NEUTROS ABS: 1.2 10*3/uL — AB (ref 1.5–6.5)
NEUTROS PCT: 51 %
Platelets: 197 10*3/uL (ref 145–400)
RBC: 4.06 MIL/uL (ref 3.70–5.45)
RDW: 12.7 % (ref 11.2–14.5)
WBC: 2.4 10*3/uL — ABNORMAL LOW (ref 3.9–10.3)

## 2018-03-02 LAB — COMPREHENSIVE METABOLIC PANEL
ALBUMIN: 3.8 g/dL (ref 3.5–5.0)
ALK PHOS: 85 U/L (ref 40–150)
ALT: 16 U/L (ref 0–55)
ANION GAP: 7 (ref 3–11)
AST: 16 U/L (ref 5–34)
BILIRUBIN TOTAL: 0.3 mg/dL (ref 0.2–1.2)
BUN: 18 mg/dL (ref 7–26)
CALCIUM: 8.9 mg/dL (ref 8.4–10.4)
CO2: 26 mmol/L (ref 22–29)
Chloride: 108 mmol/L (ref 98–109)
Creatinine, Ser: 1.05 mg/dL (ref 0.60–1.10)
GFR calc Af Amer: >60 mL/min (ref >60)
GFR calc non Af Amer: 58 mL/min — ABNORMAL LOW (ref >60)
GLUCOSE: 87 mg/dL (ref 70–140)
Potassium: 4.3 mmol/L (ref 3.5–5.1)
Sodium: 141 mmol/L (ref 136–145)
TOTAL PROTEIN: 6.3 g/dL — AB (ref 6.4–8.3)

## 2018-03-02 LAB — VITAMIN B12: Vitamin B-12: 1611 pg/mL — ABNORMAL HIGH (ref 180–914)

## 2018-03-02 MED ORDER — SODIUM CHLORIDE 0.9 % IV SOLN
Freq: Once | INTRAVENOUS | Status: AC
  Administered 2018-03-02: 10:00:00 via INTRAVENOUS

## 2018-03-02 MED ORDER — DIPHENHYDRAMINE HCL 25 MG PO CAPS
50.0000 mg | ORAL_CAPSULE | Freq: Once | ORAL | Status: AC
  Administered 2018-03-02: 50 mg via ORAL

## 2018-03-02 MED ORDER — TRASTUZUMAB CHEMO 150 MG IV SOLR
6.0000 mg/kg | Freq: Once | INTRAVENOUS | Status: AC
  Administered 2018-03-02: 504 mg via INTRAVENOUS
  Filled 2018-03-02: qty 24

## 2018-03-02 MED ORDER — DIPHENHYDRAMINE HCL 25 MG PO CAPS
ORAL_CAPSULE | ORAL | Status: AC
Start: 2018-03-02 — End: ?
  Filled 2018-03-02: qty 2

## 2018-03-02 MED ORDER — ACETAMINOPHEN 325 MG PO TABS
ORAL_TABLET | ORAL | Status: AC
  Filled 2018-03-02: qty 2

## 2018-03-02 MED ORDER — ACETAMINOPHEN 325 MG PO TABS
650.0000 mg | ORAL_TABLET | Freq: Once | ORAL | Status: AC
  Administered 2018-03-02: 650 mg via ORAL

## 2018-03-02 MED ORDER — SODIUM CHLORIDE 0.9% FLUSH
10.0000 mL | Freq: Once | INTRAVENOUS | Status: AC
  Administered 2018-03-02: 10 mL
  Filled 2018-03-02: qty 10

## 2018-03-02 MED ORDER — HEPARIN SOD (PORK) LOCK FLUSH 100 UNIT/ML IV SOLN
500.0000 [IU] | Freq: Once | INTRAVENOUS | Status: AC | PRN
  Administered 2018-03-02: 500 [IU]
  Filled 2018-03-02: qty 5

## 2018-03-02 MED ORDER — SODIUM CHLORIDE 0.9% FLUSH
10.0000 mL | INTRAVENOUS | Status: DC | PRN
  Administered 2018-03-02: 10 mL
  Filled 2018-03-02: qty 10

## 2018-03-02 NOTE — Assessment & Plan Note (Signed)
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 Neoadjuvant Taxol Herceptin weekly 11 completed 09/04/2018followed by Herceptin maintenance x 6 months completed 02/2018 2. 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 Adjuvant radiation therapy finished on 10/03/17 4. Letrozole started 11/25/17 (bone density 01/06/2018 was normal)  Letrozole toxicities: Denies any hot flashes or myalgias.  Bone density is normal  Breast cancer surveillance: Mammograms at [facility] in June will be obtained.  Return to clinic in 3 months for survivorship care plan visit

## 2018-03-02 NOTE — Patient Instructions (Signed)
[facility] Discharge Instructions for Patients Receiving Chemotherapy  Today you received the following chemotherapy agents Herceptin  To help prevent nausea and vomiting after your treatment, we encourage you to take your nausea medication as directed   If you develop nausea and vomiting that is not controlled by your nausea medication, call the clinic.   BELOW ARE SYMPTOMS THAT SHOULD BE REPORTED IMMEDIATELY:  *FEVER GREATER THAN 100.5 F  *CHILLS WITH OR WITHOUT FEVER  NAUSEA AND VOMITING THAT IS NOT CONTROLLED WITH YOUR NAUSEA MEDICATION  *UNUSUAL SHORTNESS OF BREATH  *UNUSUAL BRUISING OR BLEEDING  TENDERNESS IN MOUTH AND THROAT WITH OR WITHOUT PRESENCE OF ULCERS  *URINARY PROBLEMS  *BOWEL PROBLEMS  UNUSUAL RASH Items with * indicate a potential emergency and should be followed up as soon as possible.  Feel free to call the clinic should you have any questions or concerns. The clinic phone number is [phone].  Please show the CHEMO ALERT CARD at check-in to the Emergency Department and triage nurse.   

## 2018-03-02 NOTE — Progress Notes (Signed)
Patient Care Team: Orpah Melter, MD as PCP - General (Family Medicine) Excell Seltzer, MD as Consulting Physician (General Surgery) Magrinat, Virgie Dad, MD as Consulting Physician (Oncology) Eppie Gibson, MD as Attending Physician (Radiation Oncology) Huel Cote, NP as Nurse Practitioner (Obstetrics and Gynecology)  DIAGNOSIS:  Encounter Diagnosis  Name Primary?  . Malignant neoplasm of lower-outer quadrant of left breast of female, estrogen receptor positive ([place])     SUMMARY OF ONCOLOGIC HISTORY:   Malignant neoplasm of lower-outer quadrant of left breast of female, estrogen receptor positive ([place])   03/01/2017 Initial Diagnosis    Left breast 2:30 position: 2.1 cm irregular solid mass, additional 0.8 cm oval mass in the left breast (fibrocystic); primary mass on biopsy revealed IDC grade 3, ER 100%, PR 80%, Ki-67 25%, HER-2 positive ratio 1.88, copy #6.2, T2 N0 stage II a clinical stage      03/29/2017 - 06/14/2017 Neo-Adjuvant Chemotherapy    Neoadjuvant chemotherapy with 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  The Multi-Gene Panel offered by [facility] includes sequencing and/or deletion duplication testing of the following 80 genes: ALK, APC, ATM, AXIN2,BAP1,  BARD1, BLM, BMPR1A, BRCA1, BRCA2, BRIP1, CASR, CDC73, CDH1, CDK4, CDKN1B, CDKN1C, CDKN2A (p14ARF), CDKN2A (p16INK4a), CEBPA, CHEK2, CTNNA1, DICER1, DIS3L2, EGFR (c.2369C>T, p.Thr790Met variant only), EPCAM (Deletion/duplication testing only), FH, FLCN, GATA2, GPC3, GREM1 (Promoter region deletion/duplication testing only), HOXB13 (c.251G>A, p.Gly84Glu), HRAS, KIT, MAX, MEN1, MET, MITF (c.952G>A, p.Glu318Lys variant only), MLH1, MSH2, MSH3, MSH6, MUTYH, NBN, NF1, NF2, NTHL1, PALB2, PDGFRA, PHOX2B, PMS2, POLD1, POLE, POT1, PRKAR1A, PTCH1, PTEN, RAD50, RAD51C, RAD51D, RB1, RECQL4, RET, RUNX1, SDHAF2, SDHA (sequence changes only),  SDHB, SDHC, SDHD, SMAD4, SMARCA4, SMARCB1, SMARCE1, STK11, SUFU, TERT, TERT, TMEM127, TP53, TSC1, TSC2, VHL, WRN and WT1.  The report date is April 27, 2017.       07/21/2017 Surgery    Left lumpectomy: Grade 2 IDC 1.9 cm with DCIS, margins negative, 0/4 lymph nodes negative, ER 100%, PR 80%, HER-2 positive, Ki-67 25%, RCB class II,T1c N0 stage IA      08/29/2017 - 10/03/2017 Radiation Therapy    Adjuvant radiation:1. Left breast, 2.67 Gy in 15 fractions for a total dose of 40.05 Gy  2. Boost, 2 Gy in 5 fractions for a total dose of 10 Gy         11/25/2017 -  Anti-estrogen oral therapy    Letrozole daily       CHIEF COMPLIANT: Follow-up on Herceptin and letrozole  INTERVAL HISTORY: Haley Sanchez is a 56 year old with above-mentioned history of left breast cancer treated with neoadjuvant chemotherapy followed by lumpectomy and radiation and is currently on letrozole therapy.  She appears to be tolerating letrozole extremely well.  She is also on Herceptin.  She has no soft toxicities to Herceptin.  She will complete Herceptin in 3 weeks. Patient is experiencing a lot of pain in her feet.  She also has rashes on the neck and legs.  She has a left-sided jaw pain which is not getting better.  She has seen through dentistry but they did not find any problems.  She is seeing ENT next.  REVIEW OF SYSTEMS:   Constitutional: Denies fevers, chills or abnormal weight loss Eyes: Denies blurriness of vision Ears, nose, mouth, throat, and face: Denies mucositis or sore throat Respiratory: Denies cough, dyspnea or wheezes Cardiovascular: Denies palpitation, chest discomfort Gastrointestinal:  Denies nausea,  heartburn or change in bowel habits Skin: Denies abnormal skin rashes Lymphatics: Denies new lymphadenopathy or easy bruising Neurological:Denies numbness, tingling or new weaknesses Behavioral/Psych: Mood is stable, no new changes  Extremities: No lower extremity edema Breast:  denies any  pain or lumps or nodules in either breasts All other systems were reviewed with the patient and are negative.  I have reviewed the past medical history, past surgical history, social history and family history with the patient and they are unchanged from previous note.  ALLERGIES:  is allergic to no known allergies.  MEDICATIONS:  Current Outpatient Medications  Medication Sig Dispense Refill  . albuterol (PROVENTIL HFA;VENTOLIN HFA) 108 (90 Base) MCG/ACT inhaler Inhale 2 puffs into the lungs every 6 (six) hours as needed for wheezing or shortness of breath.    Marland Kitchen buPROPion (WELLBUTRIN XL) 300 MG 24 hr tablet Take 300 mg by mouth daily.    . cephALEXin (KEFLEX) 500 MG capsule Take 1 capsule (500 mg total) by mouth 4 (four) times daily. 40 capsule 0  . letrozole (FEMARA) 2.5 MG tablet Take 1 tablet (2.5 mg total) by mouth daily. 30 tablet 5  . lidocaine-prilocaine (EMLA) cream     . losartan (COZAAR) 100 MG tablet Take 100 mg by mouth daily.   0  . ondansetron (ZOFRAN) 8 MG tablet Take by mouth.    Marland Kitchen PARoxetine (PAXIL) 40 MG tablet Take 40 mg by mouth every evening.     . prochlorperazine (COMPAZINE) 10 MG tablet Take by mouth.    . spironolactone (ALDACTONE) 25 MG tablet Take 1 tablet (25 mg total) by mouth daily. 30 tablet 6  . Triamcinolone Acetonide (TRIAMCINOLONE 0.1 % CREAM : EUCERIN) CREA Apply 1 application topically 3 (three) times daily as needed. 1 each 1   No current facility-administered medications for this visit.     PHYSICAL EXAMINATION: ECOG PERFORMANCE STATUS: 0 - Asymptomatic  Vitals:   03/02/18 0923  BP: (!) 145/88  Pulse: 79  Resp: 18  Temp: 98.3 F (36.8 C)  SpO2: 98%   Filed Weights   03/02/18 0923  Weight: 192 lb 12.8 oz (87.5 kg)    GENERAL:alert, no distress and comfortable SKIN: skin color, texture, turgor are normal, no rashes or significant lesions EYES: normal, Conjunctiva are pink and non-injected, sclera clear OROPHARYNX:no exudate, no  erythema and lips, buccal mucosa, and tongue normal  NECK: supple, thyroid normal size, non-tender, without nodularity LYMPH:  no palpable lymphadenopathy in the cervical, axillary or inguinal LUNGS: clear to auscultation and percussion with normal breathing effort HEART: regular rate & rhythm and no murmurs and no lower extremity edema ABDOMEN:abdomen soft, non-tender and normal bowel sounds MUSCULOSKELETAL:no cyanosis of digits and no clubbing  NEURO: alert & oriented x 3 with fluent speech, no focal motor/sensory deficits EXTREMITIES: No lower extremity edema  LABORATORY DATA:  I have reviewed the data as listed CMP Latest Ref Rng & Units 01/20/2018 12/17/2017 12/09/2017  Glucose 70 - 140 mg/dL 74 113(H) 79  BUN 7 - 26 mg/dL _0 Creatinine 0.60 - 1.10 mg/dL 1.07 1.07(H) 1.11(H)  Sodium 136 - 145 mmol/L 141 141 140  Potassium 3.5 - 5.1 mmol/L 4.3 3.2(L) 4.5  Chloride 98 - 109 mmol/L 106 106 104  CO2 22 - 29 mmol/L _1 Calcium 8.4 - 10.4 mg/dL 9.4 8.7(L) 9.3  Total Protein 6.4 - 8.3 g/dL 6.5 6.6 6.4  Total Bilirubin 0.2 - 1.2 mg/dL 0.3 0.5 0.4  Alkaline Phos 40 -  150 U/L 80 64 82  AST 5 - 34 U/L _0 ALT 0 - 55 U/L _1 Lab Results  Component Value Date   WBC 2.4 (L) 03/02/2018   HGB 12.4 03/02/2018   HCT 36.5 03/02/2018   MCV 89.7 03/02/2018   PLT 197 03/02/2018   NEUTROABS 1.2 (L) 03/02/2018    ASSESSMENT & PLAN:  Malignant neoplasm of lower-outer 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 Neoadjuvant Taxol Herceptin weekly 11 completed 09/04/2018followed by Herceptin maintenance x 6 months completed 02/2018 2. breast conserving surgery with  sentinel lymph node biopsydone on 07/21/2017 10/11/2018Left lumpectomy: Grade 2 IDC 1.9 cm with DCIS, margins negative, 0/4 lymph nodes negative, ER 100%, PR 80%, HER-2 positive, Ki-67 25%, RCB class II,T1c N0 stage IA 3. Adjuvant radiation therapy finished on 10/03/17 4. Letrozole started 11/25/17 (bone density 01/06/2018 was normal)  Letrozole toxicities: Denies any hot flashes or myalgias.  Bone density is normal  Breast cancer surveillance: Mammograms at [facility] in June will be obtained. Rash, joint pains, neutropenia, left jaw pain: I suspect she may have lupus.  I will send for ANA. I will also send for B12 levels because of prior B12 deficiency.  Return to clinic in 3 weeks for Herceptin every 6 weeks for follow-up with me.  No orders of the defined types were placed in this encounter.  The patient has a good understanding of the overall plan. she agrees with it. she will call with any problems that may develop before the next visit here.   Harriette Ohara, MD 03/02/18

## 2018-03-02 NOTE — Patient Instructions (Signed)

## 2018-03-03 ENCOUNTER — Telehealth: Payer: Self-pay

## 2018-03-03 ENCOUNTER — Telehealth: Payer: Self-pay | Admitting: Hematology and Oncology

## 2018-03-03 NOTE — Telephone Encounter (Signed)
Mailed patient calendar of upcoming July appointments

## 2018-03-03 NOTE — Telephone Encounter (Signed)
Returned pt's call regarding recent results for Lupus and Vitamin B-12.  Per Dr Lindi Adie, let pt know her Vit B-12 is normal and her Lupus (ANA) results are not back yet.  I let her know per him, if they are positive then we will refer her to a rheumatologist and they should be in my chart for her when they come back.  I let her know I would keep checking for results as the day goes on. Pt voiced understanding- no other needs per pt at this time.

## 2018-03-04 LAB — ANTINUCLEAR ANTIBODIES, IFA: ANA Ab, IFA: NEGATIVE

## 2018-03-07 ENCOUNTER — Telehealth: Payer: Self-pay

## 2018-03-07 NOTE — Telephone Encounter (Signed)
Left message for patient regarding her ANA lab results. Informed pt she is negative for lupus. She is to call us with any further questions.  Cyndia Bent RN

## 2018-03-23 ENCOUNTER — Encounter: Payer: BC Managed Care – PPO | Admitting: Adult Health

## 2018-03-24 ENCOUNTER — Inpatient Hospital Stay: Payer: BC Managed Care – PPO | Attending: Hematology and Oncology

## 2018-03-24 VITALS — BP 144/87 | HR 76 | Temp 98.3°F | Resp 17

## 2018-03-24 DIAGNOSIS — Z5112 Encounter for antineoplastic immunotherapy: Secondary | ICD-10-CM | POA: Insufficient documentation

## 2018-03-24 DIAGNOSIS — C50512 Malignant neoplasm of lower-outer quadrant of left female breast: Secondary | ICD-10-CM | POA: Diagnosis present

## 2018-03-24 DIAGNOSIS — Z170 Estrogen receptor positive status [ER+]: Secondary | ICD-10-CM

## 2018-03-24 MED ORDER — SODIUM CHLORIDE 0.9% FLUSH
10.0000 mL | INTRAVENOUS | Status: DC | PRN
  Administered 2018-03-24: 10 mL
  Filled 2018-03-24: qty 10

## 2018-03-24 MED ORDER — DIPHENHYDRAMINE HCL 25 MG PO CAPS
ORAL_CAPSULE | ORAL | Status: AC
  Filled 2018-03-24: qty 2

## 2018-03-24 MED ORDER — SODIUM CHLORIDE 0.9 % IV SOLN
Freq: Once | INTRAVENOUS | Status: AC
  Administered 2018-03-24: 09:00:00 via INTRAVENOUS

## 2018-03-24 MED ORDER — SODIUM CHLORIDE 0.9 % IV SOLN
6.0000 mg/kg | Freq: Once | INTRAVENOUS | Status: AC
  Administered 2018-03-24: 504 mg via INTRAVENOUS
  Filled 2018-03-24: qty 24

## 2018-03-24 MED ORDER — HEPARIN SOD (PORK) LOCK FLUSH 100 UNIT/ML IV SOLN
500.0000 [IU] | Freq: Once | INTRAVENOUS | Status: AC | PRN
  Administered 2018-03-24: 500 [IU]
  Filled 2018-03-24: qty 5

## 2018-03-24 MED ORDER — DIPHENHYDRAMINE HCL 25 MG PO CAPS
50.0000 mg | ORAL_CAPSULE | Freq: Once | ORAL | Status: AC
  Administered 2018-03-24: 50 mg via ORAL

## 2018-03-24 MED ORDER — ACETAMINOPHEN 325 MG PO TABS
650.0000 mg | ORAL_TABLET | Freq: Once | ORAL | Status: AC
  Administered 2018-03-24: 650 mg via ORAL

## 2018-03-24 MED ORDER — ACETAMINOPHEN 325 MG PO TABS
ORAL_TABLET | ORAL | Status: AC
  Filled 2018-03-24: qty 2

## 2018-03-24 NOTE — Patient Instructions (Signed)
[facility] Discharge Instructions for Patients Receiving Chemotherapy  Today you received the following chemotherapy agents Herceptin  To help prevent nausea and vomiting after your treatment, we encourage you to take your nausea medication as directed   If you develop nausea and vomiting that is not controlled by your nausea medication, call the clinic.   BELOW ARE SYMPTOMS THAT SHOULD BE REPORTED IMMEDIATELY:  *FEVER GREATER THAN 100.5 F  *CHILLS WITH OR WITHOUT FEVER  NAUSEA AND VOMITING THAT IS NOT CONTROLLED WITH YOUR NAUSEA MEDICATION  *UNUSUAL SHORTNESS OF BREATH  *UNUSUAL BRUISING OR BLEEDING  TENDERNESS IN MOUTH AND THROAT WITH OR WITHOUT PRESENCE OF ULCERS  *URINARY PROBLEMS  *BOWEL PROBLEMS  UNUSUAL RASH Items with * indicate a potential emergency and should be followed up as soon as possible.  Feel free to call the clinic should you have any questions or concerns. The clinic phone number is [phone].  Please show the CHEMO ALERT CARD at check-in to the Emergency Department and triage nurse.   

## 2018-04-11 ENCOUNTER — Other Ambulatory Visit: Payer: Self-pay | Admitting: Hematology and Oncology

## 2018-04-12 ENCOUNTER — Telehealth: Payer: Self-pay

## 2018-04-12 NOTE — Telephone Encounter (Signed)
Called to return vm from pt. Called and lvm with call back number.

## 2018-04-12 NOTE — Telephone Encounter (Signed)
Called and spoke with pt to clarify that she will no longer be needing herceptin treatments. I will cancel all future herceptin appts. Told pt to keep her echo and cardiologist appt end of July, since she had been experiencing some mild ankle swelling. Pt had been on anti estrogen therapy since feb 2019 and have been having some difficulty with managing side effects and symptoms. She is still very fatigued, aching, ankle swelling and sleeping a lot. Advised that she may hold her letrozole for 2 weeks to see if symptoms improve. Encourage pt to drink plenty of fluids and increase activity and exercise throughout the day. 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 Per Dr.Gudena, okay to have port removal scheduled.   Messaged Dr.Wakefield to have pt port removal scheduled. Encouraged pt to call their office and let them know as well. Pt will have MM done next week and would like an appt with Dr.Gudena afterwards to discuss AE side effects that she had been experiencing and to discuss about her MM results. Scheduled pt for next week per request.   Notified pharmacy about pt completion of herceptin and they are aware. Pt verbalized understanding and has no further needs at this time.

## 2018-04-14 ENCOUNTER — Inpatient Hospital Stay: Payer: BC Managed Care – PPO

## 2018-04-14 ENCOUNTER — Inpatient Hospital Stay: Payer: BC Managed Care – PPO | Admitting: Hematology and Oncology

## 2018-04-14 ENCOUNTER — Encounter: Payer: Self-pay | Admitting: *Deleted

## 2018-04-19 NOTE — Assessment & Plan Note (Deleted)
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 Neoadjuvant Taxol Herceptin weekly 11 completed 09/04/2018followed by Herceptin maintenance x 6 months completed 02/2018 2. 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 Adjuvant radiation therapy finished on 10/03/17 4. Letrozole started 11/25/17 (bone density 01/06/2018 was normal)  Letrozole toxicities: Denies any hot flashes or myalgias.  Bone density is normal  Breast cancer surveillance:  1. Mammograms at [facility] in June will be obtained. 2. Breast exam: Benign  RTC in 1 year

## 2018-04-20 ENCOUNTER — Inpatient Hospital Stay: Payer: BC Managed Care – PPO | Admitting: Hematology and Oncology

## 2018-04-20 ENCOUNTER — Telehealth: Payer: Self-pay | Admitting: Hematology and Oncology

## 2018-04-20 NOTE — Telephone Encounter (Signed)
Returned patients call to cancel appts. Left message for patient to call office to r/s appt

## 2018-05-04 ENCOUNTER — Ambulatory Visit: Payer: BC Managed Care – PPO

## 2018-05-08 ENCOUNTER — Encounter (HOSPITAL_COMMUNITY): Payer: BC Managed Care – PPO | Admitting: Internal Medicine

## 2018-05-08 ENCOUNTER — Ambulatory Visit (HOSPITAL_COMMUNITY): Payer: BC Managed Care – PPO

## 2018-08-20 ENCOUNTER — Other Ambulatory Visit (HOSPITAL_COMMUNITY): Payer: Self-pay | Admitting: Internal Medicine

## 2018-09-04 ENCOUNTER — Telehealth: Payer: Self-pay | Admitting: Hematology and Oncology

## 2018-09-04 ENCOUNTER — Encounter: Payer: Self-pay | Admitting: Hematology and Oncology

## 2018-09-04 NOTE — Telephone Encounter (Signed)
Scheduled appt per 11/25 sch message - sent reminder letter in the mail with appt date and time.

## 2018-09-06 ENCOUNTER — Telehealth: Payer: Self-pay | Admitting: Hematology and Oncology

## 2018-09-06 NOTE — Telephone Encounter (Signed)
Tried to reach regarding 12/4 I left a message

## 2018-09-12 ENCOUNTER — Telehealth: Payer: Self-pay | Admitting: Hematology and Oncology

## 2018-09-12 NOTE — Telephone Encounter (Signed)
Tried to reach regarding voicemail. The mailbox was full

## 2018-09-12 NOTE — Assessment & Plan Note (Deleted)
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 Neoadjuvant Taxol Herceptin weekly 11 completed 09/04/2018followed by Herceptin maintenancex 6 months completed 02/2018 2. 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 Adjuvant radiation therapy finished on 10/03/17 4. Letrozole started 11/25/17 (bone density 01/06/2018 was normal)  Letrozole toxicities: Denies any hot flashes or myalgias.  Bone density is normal  Breast cancer surveillance: Mammograms at [facility] 05/04/18: Benign density B Breat Exam: 09/12/18: Benign . Rash, joint pains, neutropenia, left jaw pain: ANA Neg. B 12 : 1400  Return to clinic in 3 weeks for Herceptin every 6 weeks for follow-up with me.

## 2018-09-13 ENCOUNTER — Inpatient Hospital Stay: Payer: BC Managed Care – PPO | Attending: Hematology and Oncology | Admitting: Hematology and Oncology

## 2018-09-13 DIAGNOSIS — Z9221 Personal history of antineoplastic chemotherapy: Secondary | ICD-10-CM | POA: Insufficient documentation

## 2018-09-13 DIAGNOSIS — Z923 Personal history of irradiation: Secondary | ICD-10-CM | POA: Insufficient documentation

## 2018-09-13 DIAGNOSIS — Z170 Estrogen receptor positive status [ER+]: Secondary | ICD-10-CM | POA: Insufficient documentation

## 2018-09-13 DIAGNOSIS — Z79899 Other long term (current) drug therapy: Secondary | ICD-10-CM | POA: Insufficient documentation

## 2018-09-13 DIAGNOSIS — C50512 Malignant neoplasm of lower-outer quadrant of left female breast: Secondary | ICD-10-CM | POA: Insufficient documentation

## 2018-09-15 ENCOUNTER — Other Ambulatory Visit: Payer: Self-pay | Admitting: *Deleted

## 2018-09-15 DIAGNOSIS — C50512 Malignant neoplasm of lower-outer quadrant of left female breast: Secondary | ICD-10-CM

## 2018-09-15 DIAGNOSIS — Z170 Estrogen receptor positive status [ER+]: Principal | ICD-10-CM

## 2018-09-15 MED ORDER — LETROZOLE 2.5 MG PO TABS: 2.5000 mg | ORAL_TABLET | Freq: Every day | ORAL | 5 refills | Status: DC

## 2018-09-17 ENCOUNTER — Encounter: Payer: Self-pay | Admitting: Hematology and Oncology

## 2018-10-03 ENCOUNTER — Encounter: Payer: Self-pay | Admitting: Hematology and Oncology

## 2018-10-06 ENCOUNTER — Telehealth: Payer: Self-pay | Admitting: Hematology and Oncology

## 2018-10-06 ENCOUNTER — Inpatient Hospital Stay (HOSPITAL_BASED_OUTPATIENT_CLINIC_OR_DEPARTMENT_OTHER): Payer: BC Managed Care – PPO | Admitting: Hematology and Oncology

## 2018-10-06 ENCOUNTER — Inpatient Hospital Stay: Payer: BC Managed Care – PPO

## 2018-10-06 VITALS — BP 138/73 | HR 70 | Temp 98.2°F | Resp 20 | Ht 66.0 in | Wt 176.7 lb

## 2018-10-06 DIAGNOSIS — C50512 Malignant neoplasm of lower-outer quadrant of left female breast: Secondary | ICD-10-CM | POA: Diagnosis not present

## 2018-10-06 DIAGNOSIS — R5383 Other fatigue: Secondary | ICD-10-CM

## 2018-10-06 DIAGNOSIS — Z79899 Other long term (current) drug therapy: Secondary | ICD-10-CM

## 2018-10-06 DIAGNOSIS — Z9221 Personal history of antineoplastic chemotherapy: Secondary | ICD-10-CM | POA: Diagnosis not present

## 2018-10-06 DIAGNOSIS — Z923 Personal history of irradiation: Secondary | ICD-10-CM

## 2018-10-06 DIAGNOSIS — Z170 Estrogen receptor positive status [ER+]: Secondary | ICD-10-CM

## 2018-10-06 LAB — CBC WITH DIFFERENTIAL (CANCER CENTER ONLY)
ABS IMMATURE GRANULOCYTES: 0.01 10*3/uL (ref 0.00–0.07)
Basophils Absolute: 0 10*3/uL (ref 0.0–0.1)
Basophils Relative: 0 %
Eosinophils Absolute: 0.1 10*3/uL (ref 0.0–0.5)
Eosinophils Relative: 5 %
HCT: 38.6 % (ref 36.0–46.0)
Hemoglobin: 12.8 g/dL (ref 12.0–15.0)
Immature Granulocytes: 0 %
Lymphocytes Relative: 26 %
Lymphs Abs: 0.7 10*3/uL (ref 0.7–4.0)
MCH: 30.3 pg (ref 26.0–34.0)
MCHC: 33.2 g/dL (ref 30.0–36.0)
MCV: 91.3 fL (ref 80.0–100.0)
Monocytes Absolute: 0.2 10*3/uL (ref 0.1–1.0)
Monocytes Relative: 8 %
Neutro Abs: 1.7 10*3/uL (ref 1.7–7.7)
Neutrophils Relative %: 61 %
Platelet Count: 200 10*3/uL (ref 150–400)
RBC: 4.23 MIL/uL (ref 3.87–5.11)
RDW: 12.6 % (ref 11.5–15.5)
WBC Count: 2.8 10*3/uL — ABNORMAL LOW (ref 4.0–10.5)
nRBC: 0 % (ref 0.0–0.2)

## 2018-10-06 LAB — CMP (CANCER CENTER ONLY)
ALT: 18 U/L (ref 0–44)
AST: 17 U/L (ref 15–41)
Albumin: 3.8 g/dL (ref 3.5–5.0)
Alkaline Phosphatase: 77 U/L (ref 38–126)
Anion gap: 7 (ref 5–15)
BUN: 15 mg/dL (ref 6–20)
CO2: 29 mmol/L (ref 22–32)
Calcium: 9.1 mg/dL (ref 8.9–10.3)
Chloride: 106 mmol/L (ref 98–111)
Creatinine: 1.08 mg/dL — ABNORMAL HIGH (ref 0.44–1.00)
GFR, Est AFR Am: >60 mL/min (ref >60)
GFR, Estimated: 57 mL/min — ABNORMAL LOW (ref >60)
Glucose, Bld: 92 mg/dL (ref 70–99)
POTASSIUM: 4.4 mmol/L (ref 3.5–5.1)
SODIUM: 142 mmol/L (ref 135–145)
Total Bilirubin: 0.3 mg/dL (ref 0.3–1.2)
Total Protein: 6.4 g/dL — ABNORMAL LOW (ref 6.5–8.1)

## 2018-10-06 MED ORDER — SPIRONOLACTONE 25 MG PO TABS: 25.0000 mg | ORAL_TABLET | Freq: Every day | ORAL | 3 refills | Status: DC

## 2018-10-06 MED ORDER — LETROZOLE 2.5 MG PO TABS
2.5000 mg | ORAL_TABLET | Freq: Every day | ORAL | 3 refills | Status: DC
Start: 2018-10-06 — End: 2019-08-28

## 2018-10-06 NOTE — Assessment & Plan Note (Signed)
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 Neoadjuvant Taxol Herceptin weekly 11 completed 09/04/2018followed by Herceptin maintenancex 6 months completed 02/2018 2. 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 Adjuvant radiation therapy finished on 10/03/17 4. Letrozole started 11/25/17 (bone density 01/06/2018 was normal)  Letrozole toxicities: Denies any hot flashes or myalgias.  Bone density is normal  Breast cancer surveillance: 1.  Breast exam 10/06/2018: Benign 2.  Mammogram 04/17/2018: Benign breast density category B  Prior work-up for multiple symptoms: ANA negative, B12: 1611, WBC 2.4, ANC 1.2, ALC 0.8, all because of prior chemotherapy with Taxol.

## 2018-10-06 NOTE — Progress Notes (Signed)
Patient Care Team: Orpah Melter, MD as PCP - General (Family Medicine) Excell Seltzer, MD as Consulting Physician (General Surgery) Magrinat, Virgie Dad, MD as Consulting Physician (Oncology) Eppie Gibson, MD as Attending Physician (Radiation Oncology) Huel Cote, NP as Nurse Practitioner (Obstetrics and Gynecology)  DIAGNOSIS:  Encounter Diagnosis  Name Primary?  . Malignant neoplasm of lower-outer quadrant of left breast of female, estrogen receptor positive ([place])     SUMMARY OF ONCOLOGIC HISTORY:   Malignant neoplasm of lower-outer quadrant of left breast of female, estrogen receptor positive ([place])   03/01/2017 Initial Diagnosis    Left breast 2:30 position: 2.1 cm irregular solid mass, additional 0.8 cm oval mass in the left breast (fibrocystic); primary mass on biopsy revealed IDC grade 3, ER 100%, PR 80%, Ki-67 25%, HER-2 positive ratio 1.88, copy #6.2, T2 N0 stage II a clinical stage    03/29/2017 - 06/14/2017 Neo-Adjuvant Chemotherapy    Neoadjuvant chemotherapy with Taxol Herceptin 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  The Multi-Gene Panel offered by [facility] includes sequencing and/or deletion duplication testing of the following 80 genes: ALK, APC, ATM, AXIN2,BAP1,  BARD1, BLM, BMPR1A, BRCA1, BRCA2, BRIP1, CASR, CDC73, CDH1, CDK4, CDKN1B, CDKN1C, CDKN2A (p14ARF), CDKN2A (p16INK4a), CEBPA, CHEK2, CTNNA1, DICER1, DIS3L2, EGFR (c.2369C>T, p.Thr790Met variant only), EPCAM (Deletion/duplication testing only), FH, FLCN, GATA2, GPC3, GREM1 (Promoter region deletion/duplication testing only), HOXB13 (c.251G>A, p.Gly84Glu), HRAS, KIT, MAX, MEN1, MET, MITF (c.952G>A, p.Glu318Lys variant only), MLH1, MSH2, MSH3, MSH6, MUTYH, NBN, NF1, NF2, NTHL1, PALB2, PDGFRA, PHOX2B, PMS2, POLD1, POLE, POT1, PRKAR1A, PTCH1, PTEN, RAD50, RAD51C, RAD51D, RB1, RECQL4, RET, RUNX1, SDHAF2, SDHA (sequence changes only), SDHB,  SDHC, SDHD, SMAD4, SMARCA4, SMARCB1, SMARCE1, STK11, SUFU, TERT, TERT, TMEM127, TP53, TSC1, TSC2, VHL, WRN and WT1.  The report date is April 27, 2017.     07/21/2017 Surgery    Left lumpectomy: Grade 2 IDC 1.9 cm with DCIS, margins negative, 0/4 lymph nodes negative, ER 100%, PR 80%, HER-2 positive, Ki-67 25%, RCB class II,T1c N0 stage IA    08/29/2017 - 10/03/2017 Radiation Therapy    Adjuvant radiation:1. Left breast, 2.67 Gy in 15 fractions for a total dose of 40.05 Gy  2. Boost, 2 Gy in 5 fractions for a total dose of 10 Gy       11/25/2017 -  Anti-estrogen oral therapy    Letrozole daily     CHIEF COMPLIANT: Follow-up on letrozole therapy  INTERVAL HISTORY: Haley Sanchez is a 59-year with above-mentioned history of left breast cancer treated with neoadjuvant chemotherapy followed by lumpectomy radiation and is now on letrozole therapy.  Her biggest complaint is fatigue and she can sleep for a long period of time on the weekends probably catching up on sleep through the week of working hard.  Occasional hot flashes but denies any arthralgias or myalgias.  She has occasional sharp discomfort in the left breast intermittently.   REVIEW OF SYSTEMS:   Constitutional: Denies fevers, chills or abnormal weight loss Eyes: Denies blurriness of vision Ears, nose, mouth, throat, and face: Denies mucositis or sore throat Respiratory: Denies cough, dyspnea or wheezes Cardiovascular: Denies palpitation, chest discomfort Gastrointestinal:  Denies nausea, heartburn or change in bowel habits Skin: Denies abnormal skin rashes Lymphatics: Denies new lymphadenopathy or easy bruising Neurological:Denies numbness, tingling or new weaknesses Behavioral/Psych: Mood is stable, no new changes  Extremities: No lower extremity edema Breast: Intermittent left breast sharp pains  All other systems were reviewed with the patient and are negative.  I have reviewed the past medical history, past surgical  history, social history and family history with the patient and they are unchanged from previous note.  ALLERGIES:  is allergic to no known allergies.  MEDICATIONS:  Current Outpatient Medications  Medication Sig Dispense Refill  . albuterol (PROVENTIL HFA;VENTOLIN HFA) 108 (90 Base) MCG/ACT inhaler Inhale 2 puffs into the lungs every 6 (six) hours as needed for wheezing or shortness of breath.    Marland Kitchen buPROPion (WELLBUTRIN XL) 300 MG 24 hr tablet Take 300 mg by mouth daily.    Marland Kitchen letrozole (FEMARA) 2.5 MG tablet Take 1 tablet (2.5 mg total) by mouth daily. 30 tablet 5  . lidocaine-prilocaine (EMLA) cream     . losartan (COZAAR) 100 MG tablet Take 100 mg by mouth daily.   0  . ondansetron (ZOFRAN) 8 MG tablet Take by mouth.    Marland Kitchen PARoxetine (PAXIL) 40 MG tablet Take 40 mg by mouth every evening.     . prochlorperazine (COMPAZINE) 10 MG tablet Take by mouth.    . spironolactone (ALDACTONE) 25 MG tablet TAKE 1 TABLET BY MOUTH EVERY DAY 90 tablet 1  . Triamcinolone Acetonide (TRIAMCINOLONE 0.1 % CREAM : EUCERIN) CREA Apply 1 application topically 3 (three) times daily as needed. 1 each 1   No current facility-administered medications for this visit.     PHYSICAL EXAMINATION: ECOG PERFORMANCE STATUS: 1 - Symptomatic but completely ambulatory  Vitals:   10/06/18 1011  BP: 138/73  Pulse: 70  Resp: 20  Temp: 98.2 F (36.8 C)  SpO2: 100%   Filed Weights   10/06/18 1011  Weight: 176 lb 11.2 oz (80.2 kg)    GENERAL:alert, no distress and comfortable SKIN: skin color, texture, turgor are normal, no rashes or significant lesions EYES: normal, Conjunctiva are pink and non-injected, sclera clear OROPHARYNX:no exudate, no erythema and lips, buccal mucosa, and tongue normal  NECK: supple, thyroid normal size, non-tender, without nodularity LYMPH:  no palpable lymphadenopathy in the cervical, axillary or inguinal LUNGS: clear to auscultation and percussion with normal breathing effort HEART:  regular rate & rhythm and no murmurs and no lower extremity edema ABDOMEN:abdomen soft, non-tender and normal bowel sounds MUSCULOSKELETAL:no cyanosis of digits and no clubbing  NEURO: alert & oriented x 3 with fluent speech, no focal motor/sensory deficits EXTREMITIES: No lower extremity edema BREAST: No palpable masses or nodules in either right or left breasts. No palpable axillary supraclavicular or infraclavicular adenopathy no breast tenderness or nipple discharge. (exam performed in the presence of a chaperone)  LABORATORY DATA:  I have reviewed the data as listed CMP Latest Ref Rng & Units 03/02/2018 01/20/2018 12/17/2017  Glucose 70 - 140 mg/dL 87 74 113(H)  BUN 7 - 26 mg/dL 18 14 13   Creatinine 0.60 - 1.10 mg/dL 1.05 1.07 1.07(H)  Sodium 136 - 145 mmol/L 141 141 141  Potassium 3.5 - 5.1 mmol/L 4.3 4.3 3.2(L)  Chloride 98 - 109 mmol/L 108 106 106  CO2 22 - 29 mmol/L 26 28 24   Calcium 8.4 - 10.4 mg/dL 8.9 9.4 8.7(L)  Total Protein 6.4 - 8.3 g/dL 6.3(L) 6.5 6.6  Total Bilirubin 0.2 - 1.2 mg/dL 0.3 0.3 0.5  Alkaline Phos 40 - 150 U/L 85 80 64  AST 5 - 34 U/L 16 15 22   ALT 0 - 55 U/L 16 13 18     Lab Results  Component Value Date   WBC 2.4 (L)  03/02/2018   HGB 12.4 03/02/2018   HCT 36.5 03/02/2018   MCV 89.7 03/02/2018   PLT 197 03/02/2018   NEUTROABS 1.2 (L) 03/02/2018    ASSESSMENT & PLAN:  Malignant neoplasm of lower-outer 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 Neoadjuvant Taxol Herceptin weekly 11 completed 09/04/2018followed by Herceptin maintenancex 6 months completed 02/2018 2. breast conserving surgery with sentinel lymph node biopsydone on  07/21/2017 10/11/2018Left lumpectomy: Grade 2 IDC 1.9 cm with DCIS, margins negative, 0/4 lymph nodes negative, ER 100%, PR 80%, HER-2 positive, Ki-67 25%, RCB class II,T1c N0 stage IA 3. Adjuvant radiation therapy finished on 10/03/17 4. Letrozole started 11/25/17 (bone density 01/06/2018 was normal)  Letrozole toxicities: Denies any hot flashes or myalgias.  Bone density is normal  Breast cancer surveillance: 1.  Breast exam 10/06/2018: Benign 2.  Mammogram 04/17/2018: Benign breast density category B  Fatigue: TSH will be ordered today along with CBC with differential given her previous cytopenia. I encouraged her to go back and see her primary care physician to reestablish as well.  Prior work-up for multiple symptoms: ANA negative, B12: 1611, WBC 2.4, ANC 1.2, ALC 0.8, all because of prior chemotherapy with Taxol. Return to clinic in 1 year for follow-up   No orders of the defined types were placed in this encounter.  The patient has a good understanding of the overall plan. she agrees with it. she will call with any problems that may develop before the next visit here.   Harriette Ohara, MD 10/06/18

## 2018-10-06 NOTE — Telephone Encounter (Signed)
Scheduled appt per 12/27 los - pt aware of appt - no print out wanted - per patient my chart active.

## 2018-10-07 LAB — THYROID PANEL WITH TSH
Free Thyroxine Index: 2.3 (ref 1.2–4.9)
T3 UPTAKE RATIO: 28 % (ref 24–39)
T4, Total: 8.3 ug/dL (ref 4.5–12.0)
TSH: 1.32 u[IU]/mL (ref 0.450–4.500)

## 2018-10-09 ENCOUNTER — Telehealth: Payer: Self-pay

## 2018-10-09 NOTE — Telephone Encounter (Signed)
Patient notified of lab results.  No further needs at this time.  

## 2018-10-09 NOTE — Telephone Encounter (Signed)
Left message for patient to return call.  Re: TSH results.

## 2018-11-15 ENCOUNTER — Telehealth: Payer: Self-pay

## 2018-11-15 NOTE — Telephone Encounter (Signed)
LVM for patient reminding of SCP visit with NP on 11/24/18 at 2 pm.  Center number left for callback.

## 2018-11-24 ENCOUNTER — Inpatient Hospital Stay: Payer: BC Managed Care – PPO | Attending: Hematology and Oncology | Admitting: Adult Health

## 2018-11-24 ENCOUNTER — Telehealth: Payer: Self-pay | Admitting: Adult Health

## 2018-11-24 ENCOUNTER — Encounter: Payer: Self-pay | Admitting: Adult Health

## 2018-11-24 VITALS — BP 129/91 | HR 86 | Temp 98.6°F | Resp 20 | Ht 66.0 in | Wt 173.2 lb

## 2018-11-24 DIAGNOSIS — C50512 Malignant neoplasm of lower-outer quadrant of left female breast: Secondary | ICD-10-CM

## 2018-11-24 DIAGNOSIS — Z170 Estrogen receptor positive status [ER+]: Secondary | ICD-10-CM

## 2018-11-24 DIAGNOSIS — Z79811 Long term (current) use of aromatase inhibitors: Secondary | ICD-10-CM

## 2018-11-24 NOTE — Progress Notes (Signed)
CLINIC:  Survivorship   REASON FOR VISIT:  Routine follow-up post-treatment for a recent history of breast cancer.  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  The Multi-Gene Panel offered by [facility] includes sequencing and/or deletion duplication testing of the following 80 genes: ALK, APC, ATM, AXIN2,BAP1,  BARD1, BLM, BMPR1A, BRCA1, BRCA2, BRIP1, CASR, CDC73, CDH1, CDK4, CDKN1B, CDKN1C, CDKN2A (p14ARF), CDKN2A (p16INK4a), CEBPA, CHEK2, CTNNA1, DICER1, DIS3L2, EGFR (c.2369C>T, p.Thr790Met variant only), EPCAM (Deletion/duplication testing only), FH, FLCN, GATA2, GPC3, GREM1 (Promoter region deletion/duplication testing only), HOXB13 (c.251G>A, p.Gly84Glu), HRAS, KIT, MAX, MEN1, MET, MITF (c.952G>A, p.Glu318Lys variant only), MLH1, MSH2, MSH3, MSH6, MUTYH, NBN, NF1, NF2, NTHL1, PALB2, PDGFRA, PHOX2B, PMS2, POLD1, POLE, POT1, PRKAR1A, PTCH1, PTEN, RAD50, RAD51C, RAD51D, RB1, RECQL4, RET, RUNX1, SDHAF2, SDHA (sequence changes only), SDHB, SDHC, SDHD, SMAD4, SMARCA4, SMARCB1, SMARCE1, STK11, SUFU, TERT, TERT, TMEM127, TP53, TSC1, TSC2, VHL, WRN and WT1.  The report date is April 27, 2017.     07/21/2017 Surgery    Left lumpectomy: Grade 2 IDC 1.9 cm with DCIS, margins negative, 0/4 lymph nodes negative, ER 100%, PR 80%, HER-2 positive, Ki-67 25%, RCB class II,T1c N0 stage IA    08/29/2017 - 10/03/2017  Radiation Therapy    Adjuvant radiation:1. Left breast, 2.67 Gy in 15 fractions for a total dose of 40.05 Gy  2. Boost, 2 Gy in 5 fractions for a total dose of 10 Gy       11/25/2017 -  Anti-estrogen oral therapy    Letrozole daily     INTERVAL HISTORY:  Ms. Rudell presents to the [facility] today for our initial meeting to review her survivorship care plan detailing her treatment course for breast cancer, as well as monitoring long-term side effects of that treatment, education regarding health maintenance, screening, and overall wellness and health promotion.     Overall, Ms. Zwilling reports feeling quite well.  She is taking Letrozole daily.  She is taking vitamin D and is doing well.  She denies hot flashes, arthralgias, or vaginal dryness.  She notes that her biggest issue is having difficulty with her left jaw.  She notes jaw pain started during radiation.  She says it is on her left side.  She has undergone dental evaluation.  She feels like some of her enamel on the left side of her teeth are decreasing.  Hailynn notes that she has cataracts now.  She is also sensitive to light.  Jenisha has some forgetfulness.      REVIEW OF SYSTEMS:  Review of Systems  Constitutional: Negative for appetite change, chills, fatigue, fever and unexpected weight change.  HENT:   Negative for hearing loss, lump/mass, sore throat and trouble swallowing.   Eyes: Negative for eye problems and icterus.  Respiratory: Negative for chest tightness, cough and shortness of breath.   Cardiovascular: Negative for chest pain, leg swelling and palpitations.  Gastrointestinal:  Negative for abdominal distention, abdominal pain, constipation, diarrhea, nausea and vomiting.  Endocrine: Negative for hot flashes.  Skin: Negative for itching and rash.  Neurological: Negative for dizziness, extremity weakness, headaches and numbness.  Hematological: Negative for adenopathy. Does not bruise/bleed easily.    Psychiatric/Behavioral: Negative for depression. The patient is not nervous/anxious.    Breast: Denies any new nodularity, masses, tenderness, nipple changes, or nipple discharge.      ONCOLOGY TREATMENT TEAM:  1. Surgeon:  Dr. Donne Hazel at [facility] 2. Medical Oncologist: Dr. Lindi Adie  3. Radiation Oncologist: Dr. Isidore Moos    PAST MEDICAL/SURGICAL HISTORY:  Past Medical History:  Diagnosis Date  . Anxiety   . Asthma   . Cancer [facility])    breast cancer  . Complication of anesthesia   . Depression   . Family history of breast cancer   . Family history of prostate cancer   . History of radiation therapy 08/29/2017- 10/03/17   Left Breast 40.05 Gy in 15 fractions followed by a boost of 10 Gy per fraction.   . Hypertension   . PONV (postoperative nausea and vomiting)   . Seasonal allergies    Past Surgical History:  Procedure Laterality Date  . BREAST LUMPECTOMY WITH RADIOACTIVE SEED AND SENTINEL LYMPH NODE BIOPSY Left 07/21/2017   Procedure: BREAST LUMPECTOMY WITH RADIOACTIVE SEED AND SENTINEL LYMPH NODE BIOPSY;  Surgeon: Rolm Bookbinder, MD;  Location: [place];  Service: General;  Laterality: Left;  . PORTACATH PLACEMENT Right 03/28/2017   Procedure: INSERTION PORT-A-CATH WITH [place];  Surgeon: Rolm Bookbinder, MD;  Location: [place];  Service: General;  Laterality: Right;     ALLERGIES:  Allergies  Allergen Reactions  . No Known Allergies      CURRENT MEDICATIONS:  Outpatient Encounter Medications as of 11/24/2018  Medication Sig  . albuterol (PROVENTIL HFA;VENTOLIN HFA) 108 (90 Base) MCG/ACT inhaler Inhale 2 puffs into the lungs every 6 (six) hours as needed for wheezing or shortness of breath.  Marland Kitchen buPROPion (WELLBUTRIN XL) 300 MG 24 hr tablet Take 300 mg by mouth daily.  Marland Kitchen letrozole (FEMARA) 2.5 MG tablet Take 1 tablet (2.5 mg total) by mouth daily.  Marland Kitchen losartan (COZAAR) 100 MG tablet Take 100 mg by mouth daily.   . ondansetron (ZOFRAN) 8  MG tablet Take by mouth.  Marland Kitchen PARoxetine (PAXIL) 40 MG tablet Take 40 mg by mouth every evening.   . prochlorperazine (COMPAZINE) 10 MG tablet Take by mouth.  . spironolactone (ALDACTONE) 25 MG tablet Take 1 tablet (25 mg total) by mouth daily.  . Triamcinolone Acetonide (TRIAMCINOLONE 0.1 % CREAM : EUCERIN) CREA Apply 1 application topically 3 (three) times daily as needed.  Marland Kitchen VYVANSE 50 MG capsule    No facility-administered encounter medications on file as of 11/24/2018.      ONCOLOGIC FAMILY HISTORY:  Family History  Problem Relation Age of Onset  . Heart disease Mother   . Hypertension Mother   . Kidney failure Father   . Diabetes Father   . Hypertension Father   . Prostate cancer Father 62  . Brain cancer Maternal Grandmother 20  . Prostate cancer Maternal Grandfather        dx in his 47s  . Breast cancer Paternal Grandmother   . Alcohol abuse Maternal Aunt   . Other Maternal Uncle        died in [place]  . Breast cancer Cousin        maternal first cousin dx in her 31s  .  Mental retardation Cousin   . Breast cancer Other        maternal second cousin  . Breast cancer Cousin        paternal first cousin  . Breast cancer Cousin        paternal first cousin     GENETIC COUNSELING/TESTING: negative  SOCIAL HISTORY:  Social History   Socioeconomic History  . Marital status: Divorced    Spouse name: Not on file  . Number of children: Not on file  . Years of education: Not on file  . Highest education level: Not on file  Occupational History  . Not on file  Social Needs  . Financial resource strain: Not on file  . Food insecurity:    Worry: Not on file    Inability: Not on file  . Transportation needs:    Medical: Not on file    Non-medical: Not on file  Tobacco Use  . Smoking status: Never Smoker  . Smokeless tobacco: Never Used  Substance and Sexual Activity  . Alcohol use: No    Comment: rarely  . Drug use: Yes    Types: Marijuana    Comment: last use  03/25/17  . Sexual activity: Not Currently  Lifestyle  . Physical activity:    Days per week: Not on file    Minutes per session: Not on file  . Stress: Not on file  Relationships  . Social connections:    Talks on phone: Not on file    Gets together: Not on file    Attends religious service: Not on file    Active member of club or organization: Not on file    Attends meetings of clubs or organizations: Not on file    Relationship status: Not on file  . Intimate partner violence:    Fear of current or ex partner: Not on file    Emotionally abused: Not on file    Physically abused: Not on file    Forced sexual activity: Not on file  Other Topics Concern  . Not on file  Social History Narrative  . Not on file     PHYSICAL EXAMINATION:  Vital Signs:   Vitals:   11/24/18 1400  BP: (!) 129/91  Pulse: 86  Resp: 20  Temp: 98.6 F (37 C)  SpO2: 99%   Filed Weights   11/24/18 1400  Weight: 173 lb 3.2 oz (78.6 kg)   General: Well-nourished, well-appearing female in no acute distress.  She is unaccompanied today.   HEENT: Head is normocephalic.  Pupils equal and reactive to light. Conjunctivae clear without exudate.  Sclerae anicteric. Oral mucosa is pink, moist.  Oropharynx is pink without lesions or erythema.  Lymph: No cervical, supraclavicular, or infraclavicular lymphadenopathy noted on palpation.  Cardiovascular: Regular rate and rhythm.Marland Kitchen Respiratory: Clear to auscultation bilaterally. Chest expansion symmetric; breathing non-labored.  Breast exam: left breast s/p lumpectomy and radiation, no sign of local recurrence, right breast benign GI: Abdomen soft and round; non-tender, non-distended. Bowel sounds normoactive.  GU: Deferred.  Neuro: No focal deficits. Steady gait.  Psych: Mood and affect normal and appropriate for situation.  Extremities: No edema. MSK: No focal spinal tenderness to palpation.  Full range of motion in bilateral upper extremities, Left TMJ  +TTP Skin: Warm and dry.  LABORATORY DATA:  None for this visit.  DIAGNOSTIC IMAGING:  None for this visit.  Mammogram on 04/17/18 that showed no evidence of malignancy and breast density category B.  ASSESSMENT AND PLAN:  Ms.. Montante is a pleasant 58 y.o. female with Stage IB left breast invasive ductal carcinoma, ER+/PR+/HER2+, diagnosed in 02/2017, treated with neoadjuvant chemotherapy, lumpectomy, adjuvant radiation therapy, and anti-estrogen therapy with Letrozole beginning in 11/2017.  She presents to the Survivorship Clinic for our initial meeting and routine follow-up post-completion of treatment for breast cancer.    1. Stage IB right/left breast cancer:  Ms. Kerrick is continuing to recover from definitive treatment for breast cancer. She will follow-up with her medical oncologist, Dr. Lindi Adie in 09/2019 with history and physical exam per surveillance protocol.  She will continue her anti-estrogen therapy with Letrozole. Thus far, she is tolerating the Letrozole well, with minimal side effects. Today, a comprehensive survivorship care plan and treatment summary was reviewed with the patient today detailing her breast cancer diagnosis, treatment course, potential late/long-term effects of treatment, appropriate follow-up care with recommendations for the future, and patient education resources.  A copy of this summary, along with a letter will be sent to the patient's primary care provider via mail/fax/In Basket message after today's visit.    2. Left jaw pain: Think this is pain in the TMJ joint possibly from grinding her teeth.  Since dentistry hasn't noted any issues, I recommended that she try taking Aleve BID and see if it helps.  She may need referral to ENT.    3. Bone health:  Given Ms. Girtman's age/history of breast cancer and her current treatment regimen including anti-estrogen therapy with Letrozole, she is at risk for bone demineralization.  Her last DEXA scan was 01/06/2018 and was  normal.  In the meantime, she was encouraged to increase her consumption of foods rich in calcium, as well as increase her weight-bearing activities.  She was given education on specific activities to promote bone health.  4. Cancer screening:  Due to Ms. Nebel's history and her age, she should receive screening for skin cancers, colon cancer, and gynecologic cancers.  The information and recommendations are listed on the patient's comprehensive care plan/treatment summary and were reviewed in detail with the patient.    5. Health maintenance and wellness promotion: Ms. Montrose was encouraged to consume 5-7 servings of fruits and vegetables per day. We reviewed the "Nutrition Rainbow" handout, as well as the handout "Take Control of Your Health and Reduce Your Cancer Risk" from the [facility].  She was also encouraged to engage in moderate to vigorous exercise for 30 minutes per day most days of the week. We discussed the [employer] fitness program, which is designed for cancer survivors to help them become more physically fit after cancer treatments.  She was instructed to limit her alcohol consumption and continue to abstain from tobacco use.     6. Support services/counseling: It is not uncommon for this period of the patient's cancer care trajectory to be one of many emotions and stressors.  We discussed an opportunity for her to participate in the next session of [facility] ("Finding Your New Normal") support group series designed for patients after they have completed treatment.   Ms. Neukam was encouraged to take advantage of our many other support services programs, support groups, and/or counseling in coping with her new life as a cancer survivor after completing anti-cancer treatment.  She was offered support today through active listening and expressive supportive counseling.  She was given information regarding our available services and encouraged to contact me with any questions or for  help enrolling in any of our support  group/programs.    Dispo:   -Return to cancer center for follow up with Dr. Lindi Adie in 09/2019 -Mammogram due in 04/2019 -Bone Density due 12/2019 -Follow up with Dr. Donne Hazel in 03/2019 -She is welcome to return back to the Survivorship Clinic at any time; no additional follow-up needed at this time.  -Consider referral back to survivorship as a long-term survivor for continued surveillance  A total of (30) minutes of face-to-face time was spent with this patient with greater than 50% of that time in counseling and care-coordination.   Gardenia Phlegm, [place] [phone]   Note: PRIMARY CARE PROVIDER Orpah Melter, [place] [phone]

## 2018-11-24 NOTE — Telephone Encounter (Signed)
No los °

## 2018-11-26 IMAGING — CR DG CHEST 2V
2 series · 2 of 2 positions shown · non-contrast
Comparison: 09/30/2017

CLINICAL DATA: Pt states she was diagnosed with the flu yesterday.
Pt states symptoms started yesterday with SOb and a productive
cough. Pt states her primary L[place]ON thinks she "might be septic". PT HX:
left Breast CA, HTN, asthma

EXAM:
CHEST - 2 VIEW

[w chest pa]
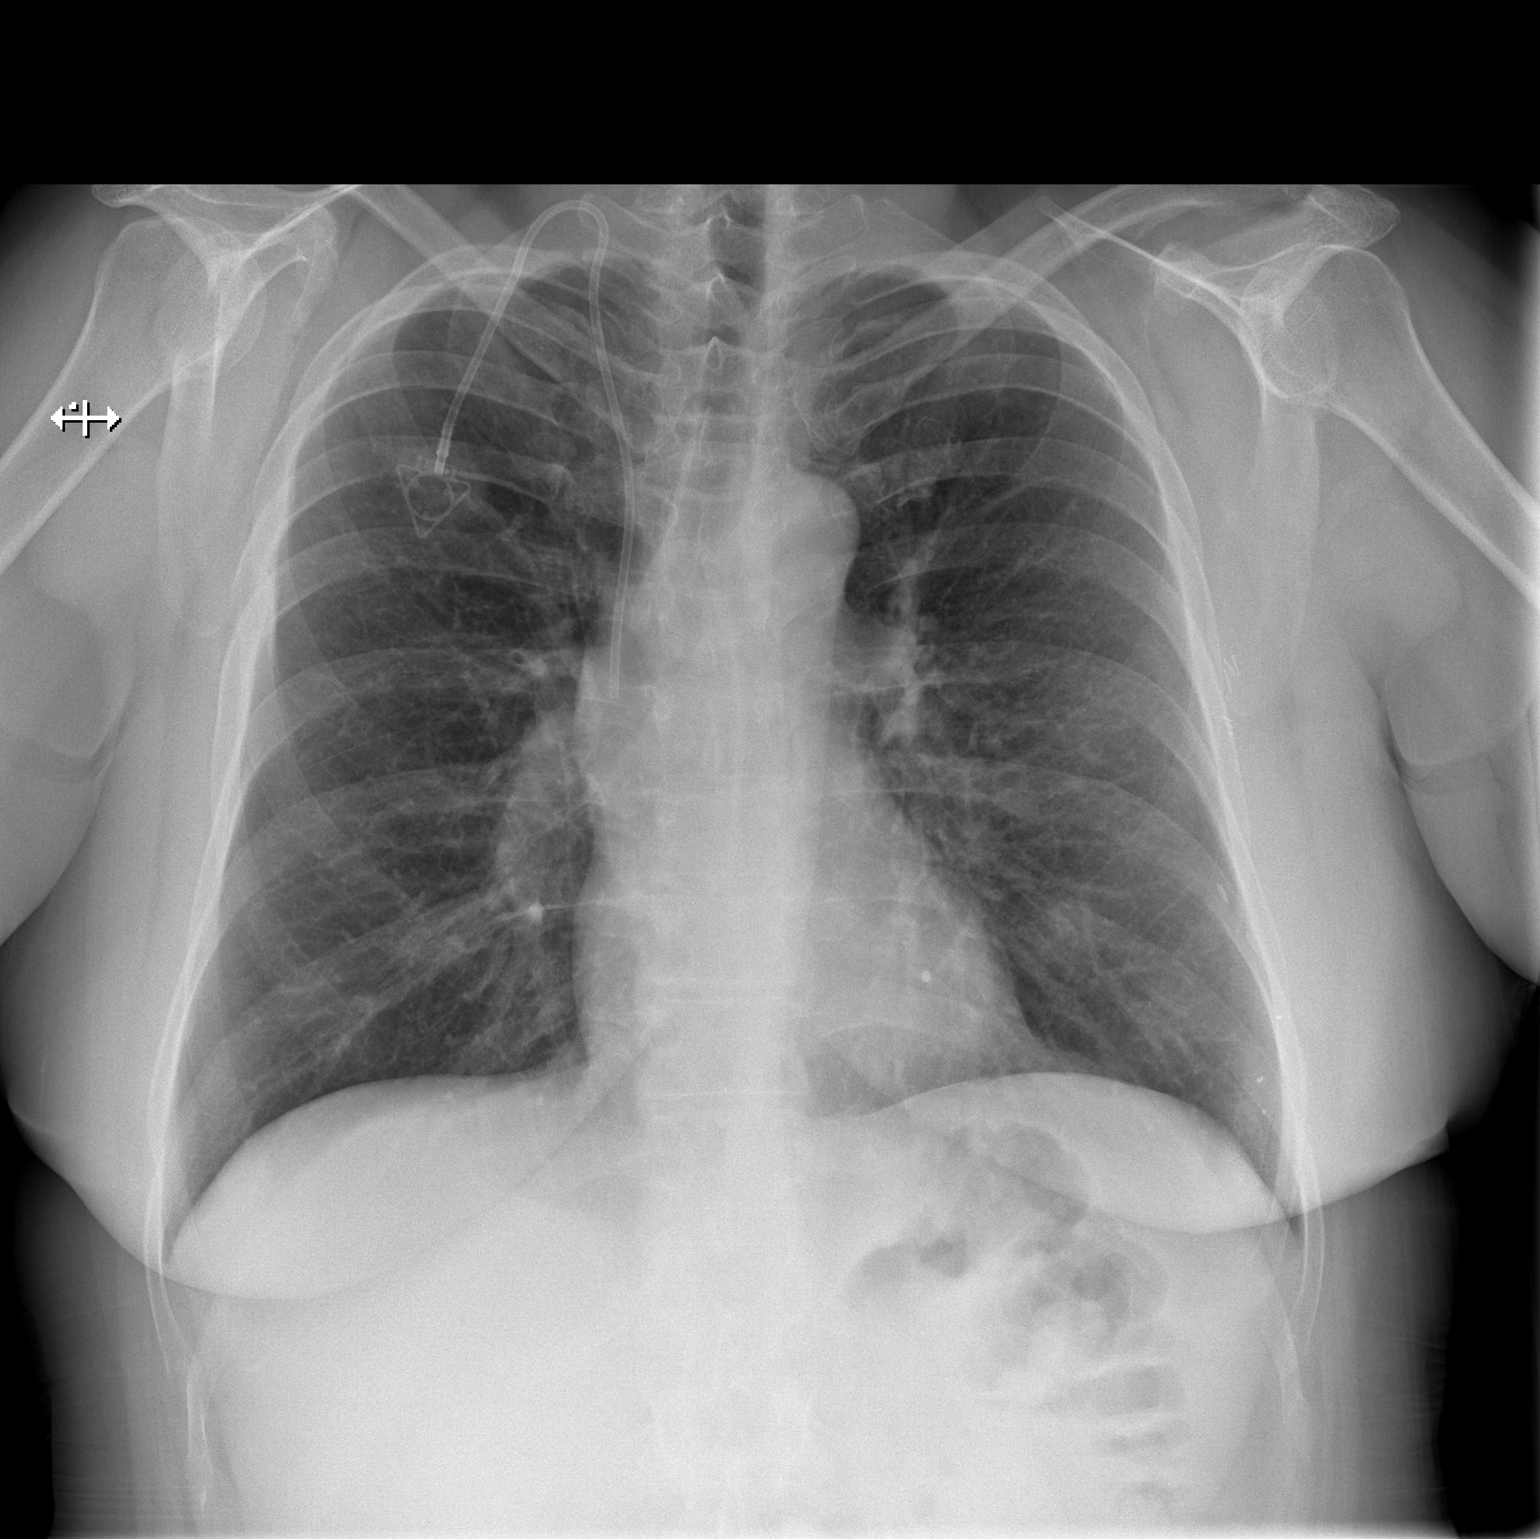

[w chest lat]
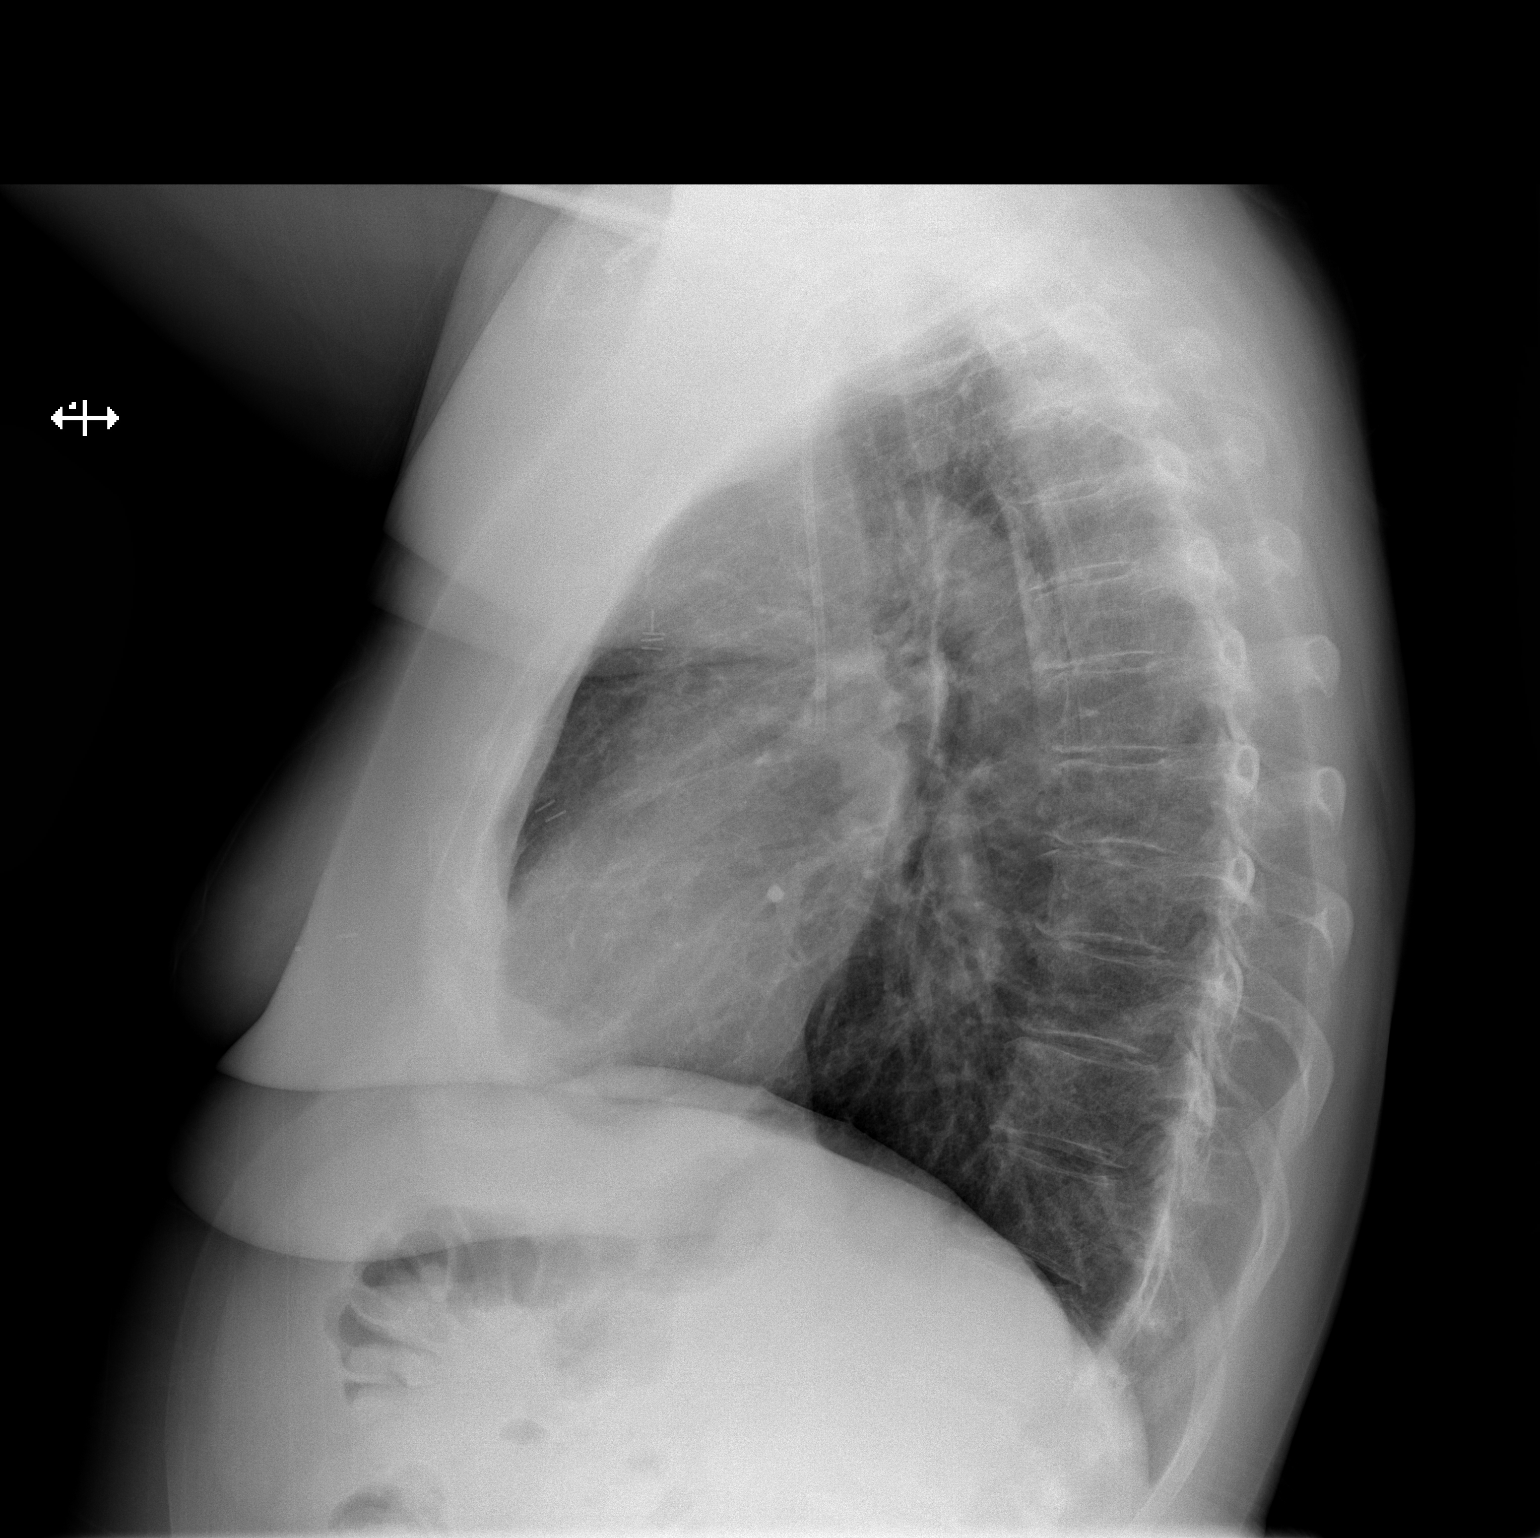

[2 of 2 positions shown; findings below may reference images not displayed]

FINDINGS: Heart, mediastinum and hila are unremarkable.

The lungs are clear.  No pleural effusion or pneumothorax.

Right anterior chest wall Port-A-Cath is stable and well positioned.

Stable changes from left breast surgery.

Skeletal structures are demineralized but otherwise unremarkable.
IMPRESSION: No active cardiopulmonary disease.

## 2019-05-22 ENCOUNTER — Encounter: Payer: Self-pay | Admitting: Hematology and Oncology

## 2019-05-22 ENCOUNTER — Encounter: Payer: Self-pay | Admitting: *Deleted

## 2019-07-23 ENCOUNTER — Encounter: Payer: Self-pay | Admitting: Adult Health

## 2019-07-24 NOTE — Telephone Encounter (Signed)
Notified patient triage has notified Dr. Geralyn Flash nurse of portal communications and will return call in reference to appointment request.  "Haley Sanchez [phone]) calling to make an appointment.  Dr. Geralyn Flash nurse emailed me.  I am a two year survivor.  There is a place on my neck that  Has been growing since March."

## 2019-07-27 ENCOUNTER — Encounter: Payer: Self-pay | Admitting: Adult Health

## 2019-07-27 ENCOUNTER — Other Ambulatory Visit: Payer: Self-pay

## 2019-07-27 ENCOUNTER — Inpatient Hospital Stay: Payer: BC Managed Care – PPO | Attending: Adult Health | Admitting: Adult Health

## 2019-07-27 DIAGNOSIS — Z79811 Long term (current) use of aromatase inhibitors: Secondary | ICD-10-CM | POA: Insufficient documentation

## 2019-07-27 DIAGNOSIS — Z170 Estrogen receptor positive status [ER+]: Secondary | ICD-10-CM

## 2019-07-27 DIAGNOSIS — C50512 Malignant neoplasm of lower-outer quadrant of left female breast: Secondary | ICD-10-CM | POA: Insufficient documentation

## 2019-07-27 NOTE — Assessment & Plan Note (Addendum)
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 Neoadjuvant Taxol Herceptin weekly 11 completed 09/04/2018followed by Herceptin maintenancex 6 months completed 02/2018 2. 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 Adjuvant radiation therapy finished on 10/03/17 4. Letrozole started 11/25/17 (bone density 01/06/2018 was normal)  Letrozole toxicities: Denies any hot flashes or myalgias.  Bone density is normal  No sign of breast cancer recurrence, due to left SCV swelling and ? Lymphadneopathy, placed order for CT neck.  Labs to be done on Monday.

## 2019-07-27 NOTE — Progress Notes (Signed)
[facility] Follow up:    Haley Melter, MD [address] [place] [address]   DIAGNOSIS: Cancer Staging Malignant neoplasm of lower-outer quadrant of left breast of female, estrogen receptor positive ([place]) Staging form: Breast, AJCC 8th Edition - Clinical stage from 03/09/2017: Stage IB (cT2, cN0, cM0, G3, ER: Positive, PR: Positive, HER2: Positive) - Unsigned Staging comments: Staged at breast conference on 5.30.18 - Pathologic: No Stage Recommended (ypT1c, pN0, cM0, G2, ER: Positive, PR: Positive, HER2: Negative) - Signed by Gardenia Phlegm, NP on 08/03/2017   SUMMARY OF ONCOLOGIC HISTORY: Oncology History  Malignant neoplasm of lower-outer quadrant of left breast of female, estrogen receptor positive ([place])  03/01/2017 Initial Diagnosis   Left breast 2:30 position: 2.1 cm irregular solid mass, additional 0.8 cm oval mass in the left breast (fibrocystic); primary mass on biopsy revealed IDC grade 3, ER 100%, PR 80%, Ki-67 25%, HER-2 positive ratio 1.88, copy #6.2, T2 N0 stage II a clinical stage   03/29/2017 - 06/14/2017 Neo-Adjuvant Chemotherapy   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  The Multi-Gene Panel offered by [facility] includes sequencing and/or deletion duplication testing of the following 80 genes: ALK, APC, ATM, AXIN2,BAP1,  BARD1, BLM, BMPR1A, BRCA1, BRCA2, BRIP1, CASR, CDC73, CDH1, CDK4, CDKN1B, CDKN1C, CDKN2A (p14ARF), CDKN2A (p16INK4a), CEBPA, CHEK2, CTNNA1, DICER1, DIS3L2, EGFR (c.2369C>T, p.Thr790Met variant only), EPCAM (Deletion/duplication testing only), FH, FLCN, GATA2, GPC3, GREM1 (Promoter region deletion/duplication testing only), HOXB13 (c.251G>A, p.Gly84Glu), HRAS, KIT, MAX, MEN1, MET, MITF (c.952G>A, p.Glu318Lys variant only), MLH1, MSH2, MSH3, MSH6, MUTYH, NBN, NF1, NF2, NTHL1, PALB2, PDGFRA, PHOX2B,  PMS2, POLD1, POLE, POT1, PRKAR1A, PTCH1, PTEN, RAD50, RAD51C, RAD51D, RB1, RECQL4, RET, RUNX1, SDHAF2, SDHA (sequence changes only), SDHB, SDHC, SDHD, SMAD4, SMARCA4, SMARCB1, SMARCE1, STK11, SUFU, TERT, TERT, TMEM127, TP53, TSC1, TSC2, VHL, WRN and WT1.  The report date is April 27, 2017.    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 Left breast, 2.67 Gy in 15 fractions for a total dose of 40.05 Gy  2. Boost, 2 Gy in 5 fractions for a total dose of 10 Gy      11/25/2017 -  Anti-estrogen oral therapy   Letrozole daily     CURRENT THERAPY: Letrozole daily  INTERVAL HISTORY: Haley Sanchez 58 y.o. female returns for evaluation for an urgent need for enlarged lymph node.  She has continued on Letrozole daily without any issues.  She has been seen a couple of times in the past year and has had a swollen SCV lymph node ont he left side.  She had a recent sinusitis, and was recommended to come and see [place] because it could be lymphoma.   She has not had any unintentional weight loss recently.  She has been working on losing weight again.  She has some night sweats, and also notes that she is nauseated occasionally.  She denies any pain anywhere else, or any other swollen lymph nodes.    Patient Active Problem List   Diagnosis Date Noted   Cystitis 05/06/2017   Hematuria 05/05/2017   Genetic testing 04/29/2017   Family history of breast cancer    Family history of  prostate cancer    Port catheter in place 04/05/2017   Malignant neoplasm of lower-outer quadrant of left breast of female, estrogen receptor positive ([place]) 03/03/2017    is allergic to no known allergies.  MEDICAL HISTORY: Past Medical History:  Diagnosis Date   Anxiety    Asthma    Cancer ([place])    breast  cancer   Complication of anesthesia    Depression    Family history of breast cancer    Family history of prostate cancer    History of radiation therapy 08/29/2017- 10/03/17   Left Breast 40.05 Gy in 15 fractions followed by a boost of 10 Gy per fraction.    Hypertension    PONV (postoperative nausea and vomiting)    Seasonal allergies     SURGICAL HISTORY: Past Surgical History:  Procedure Laterality Date   BREAST LUMPECTOMY WITH RADIOACTIVE SEED AND SENTINEL LYMPH NODE BIOPSY Left 07/21/2017   Procedure: BREAST LUMPECTOMY WITH RADIOACTIVE SEED AND SENTINEL LYMPH NODE BIOPSY;  Surgeon: Rolm Bookbinder, MD;  Location: [facility];  Service: General;  Laterality: Left;   PORTACATH PLACEMENT Right 03/28/2017   Procedure: [place] WITH [place];  Surgeon: Rolm Bookbinder, MD;  Location: [facility];  Service: General;  Laterality: Right;    SOCIAL HISTORY: Social History   Socioeconomic History   Marital status: Divorced    Spouse name: Not on file   Number of children: Not on file   Years of education: Not on file   Highest education level: Not on file  Occupational History   Not on file  Social Needs   Financial resource strain: Not on file   Food insecurity    Worry: Not on file    Inability: Not on file   Transportation needs    Medical: Not on file    Non-medical: Not on file  Tobacco Use   Smoking status: Never Smoker   Smokeless tobacco: Never Used  Substance and Sexual Activity   Alcohol use: No    Comment: rarely   Drug use: Yes    Types: Marijuana    Comment: last use 03/25/17   Sexual activity: Not Currently  Lifestyle   Physical activity    Days per week: Not on file    Minutes per session: Not on file   Stress: Not on file  Relationships   Social connections    Talks on phone: Not on file    Gets together: Not on file    Attends religious service: Not on file    Active member of club or organization:  Not on file    Attends meetings of clubs or organizations: Not on file    Relationship status: Not on file   Intimate partner violence    Fear of current or ex partner: Not on file    Emotionally abused: Not on file    Physically abused: Not on file    Forced sexual activity: Not on file  Other Topics Concern   Not on file  Social History Narrative   Not on file    FAMILY HISTORY: Family History  Problem Relation Age of Onset   Heart disease Mother    Hypertension Mother    Kidney failure Father    Diabetes Father    Hypertension Father    Prostate cancer Father 54   Brain cancer Maternal Grandmother 51   Prostate cancer Maternal Grandfather        dx in his 42s  Breast cancer Paternal Grandmother    Alcohol abuse Maternal Aunt    Other Maternal Uncle        died in MVA   Breast cancer Cousin        maternal first cousin dx in her 50s   Mental retardation Cousin    Breast cancer Other        maternal second cousin   Breast cancer Cousin        paternal first cousin   Breast cancer Cousin        paternal first cousin    Review of Systems  Constitutional: Negative for appetite change, chills, fatigue, fever and unexpected weight change.  HENT:   Negative for hearing loss, lump/mass and trouble swallowing.   Eyes: Negative for eye problems and icterus.  Respiratory: Negative for chest tightness, cough and shortness of breath.   Cardiovascular: Negative for chest pain, leg swelling and palpitations.  Gastrointestinal: Positive for nausea. Negative for abdominal distention, abdominal pain, constipation, diarrhea and vomiting.  Endocrine: Positive for hot flashes.  Genitourinary: Negative for difficulty urinating.   Musculoskeletal: Negative for arthralgias.  Skin: Negative for itching and rash.  Neurological: Negative for dizziness, extremity weakness, headaches and numbness.  Hematological: Negative for adenopathy. Does not bruise/bleed easily.    Psychiatric/Behavioral: Negative for depression. The patient is not nervous/anxious.       PHYSICAL EXAMINATION  ECOG PERFORMANCE STATUS: 1 - Symptomatic but completely ambulatory  Vitals:   07/27/19 1551  BP: (!) 144/75  Pulse: 85  Temp: 98.5 F (36.9 C)  SpO2: 100%    Physical Exam Constitutional:      General: She is not in acute distress.    Appearance: Normal appearance. She is not toxic-appearing.  HENT:     Head: Normocephalic and atraumatic.     Mouth/Throat:     Mouth: Mucous membranes are moist.     Pharynx: Oropharynx is clear. No oropharyngeal exudate or posterior oropharyngeal erythema.  Eyes:     General: No scleral icterus.    Pupils: Pupils are equal, round, and reactive to light.  Neck:     Musculoskeletal: Normal range of motion and neck supple.     Comments: Left SCV swelling Cardiovascular:     Rate and Rhythm: Normal rate and regular rhythm.     Pulses: Normal pulses.     Heart sounds: Normal heart sounds.  Pulmonary:     Effort: Pulmonary effort is normal.     Breath sounds: Normal breath sounds.  Abdominal:     General: Abdomen is flat. Bowel sounds are normal.     Palpations: Abdomen is soft.  Musculoskeletal:        General: No swelling.  Lymphadenopathy:     Cervical: No cervical adenopathy.  Skin:    General: Skin is warm and dry.     Findings: No rash.  Neurological:     General: No focal deficit present.     Mental Status: She is alert and oriented to person, place, and time.  Psychiatric:        Mood and Affect: Mood normal.        Behavior: Behavior normal.     LABORATORY DATA:  CBC    Component Value Date/Time   WBC 2.8 (L) 10/06/2018 1042   WBC 2.4 (L) 03/02/2018 0914   RBC 4.23 10/06/2018 1042   HGB 12.8 10/06/2018 1042   HGB 13.4 09/16/2017 1314   HCT 38.6 10/06/2018 1042   HCT 39.5 09/16/2017  1314   PLT 200 10/06/2018 1042   PLT 236 09/16/2017 1314   MCV 91.3 10/06/2018 1042   MCV 91.0 09/16/2017 1314    MCH 30.3 10/06/2018 1042   MCHC 33.2 10/06/2018 1042   RDW 12.6 10/06/2018 1042   RDW 12.9 09/16/2017 1314   LYMPHSABS 0.7 10/06/2018 1042   LYMPHSABS 0.9 09/16/2017 1314   MONOABS 0.2 10/06/2018 1042   MONOABS 0.8 09/16/2017 1314   EOSABS 0.1 10/06/2018 1042   EOSABS 0.0 09/16/2017 1314   BASOSABS 0.0 10/06/2018 1042   BASOSABS 0.0 09/16/2017 1314    CMP     Component Value Date/Time   NA 142 10/06/2018 1042   NA 139 09/16/2017 1314   K 4.4 10/06/2018 1042   K 4.0 09/16/2017 1314   CL 106 10/06/2018 1042   CO2 29 10/06/2018 1042   CO2 22 09/16/2017 1314   GLUCOSE 92 10/06/2018 1042   GLUCOSE 84 09/16/2017 1314   BUN 15 10/06/2018 1042   BUN 22.5 09/16/2017 1314   CREATININE 1.08 (H) 10/06/2018 1042   CREATININE 1.2 (H) 09/16/2017 1314   CALCIUM 9.1 10/06/2018 1042   CALCIUM 8.5 09/16/2017 1314   PROT 6.4 (L) 10/06/2018 1042   PROT 6.3 (L) 09/16/2017 1314   ALBUMIN 3.8 10/06/2018 1042   ALBUMIN 3.5 09/16/2017 1314   AST 17 10/06/2018 1042   AST 13 09/16/2017 1314   ALT 18 10/06/2018 1042   ALT 16 09/16/2017 1314   ALKPHOS 77 10/06/2018 1042   ALKPHOS 84 09/16/2017 1314   BILITOT 0.3 10/06/2018 1042   BILITOT 0.25 09/16/2017 1314   GFRNONAA 57 (L) 10/06/2018 1042   GFRAA >60 10/06/2018 1042       RADIOGRAPHIC STUDIES:      ASSESSMENT and THERAPY PLAN:   Malignant neoplasm of lower-outer quadrant of left breast of female, estrogen receptor positive ([place]) Left breast 2:30 position: 2.1 cm irregular solid mass, additional 0.8 cm oval mass in the left breast (fibrocystic); primary mass on biopsy revealed IDC grade 3, ER 100%, PR 80%, Ki-67 25%, HER-2 positive ratio 1.88, copy #6.2, T2 N0 stage II a clinical stage  Treatment summary: 1. Neoadjuvant Taxol Herceptin weekly 11 completed 09/04/2018followed by Herceptin maintenancex 6 months completed 02/2018 2. 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 Adjuvant radiation therapy finished on 10/03/17 4. Letrozole started 11/25/17 (bone density 01/06/2018 was normal)  Letrozole toxicities: Denies any hot flashes or myalgias.  Bone density is normal  No sign of breast cancer recurrence, due to left SCV swelling and ? Lymphadneopathy, placed order for CT neck.  Labs to be done on Monday.       Orders Placed This Encounter  Procedures   CT Soft Tissue Neck W Contrast    Standing Status:   Future    Standing Expiration Date:   07/26/2020    Order Specific Question:   If indicated for the ordered procedure, I authorize the administration of contrast media per Radiology protocol    Answer:   Yes    Order Specific Question:   Is patient pregnant?    Answer:   No    Order Specific Question:   Preferred imaging location?    Answer:   [facility]    Order Specific Question:   Radiology Contrast Protocol -  do NOT remove file path    Answer:   [email or web address]   CBC with Differential ([place] Only)    Standing Status:   Future    Standing Expiration Date:   07/26/2020   CMP ([place] only)    Standing Status:   Future    Standing Expiration Date:   07/26/2020    All questions were answered. The patient knows to call the clinic with any problems, questions or concerns. We can certainly see the patient much sooner if necessary.  A total of (30) minutes of face-to-face time was spent with this patient with greater than 50% of that time in counseling and care-coordination.  This note was electronically signed. Scot Dock, NP 07/27/2019

## 2019-07-28 ENCOUNTER — Encounter: Payer: Self-pay | Admitting: Adult Health

## 2019-07-30 ENCOUNTER — Telehealth: Payer: Self-pay | Admitting: Adult Health

## 2019-07-30 ENCOUNTER — Other Ambulatory Visit: Payer: Self-pay

## 2019-07-30 ENCOUNTER — Inpatient Hospital Stay: Payer: BC Managed Care – PPO

## 2019-07-30 DIAGNOSIS — Z170 Estrogen receptor positive status [ER+]: Secondary | ICD-10-CM

## 2019-07-30 DIAGNOSIS — C50512 Malignant neoplasm of lower-outer quadrant of left female breast: Secondary | ICD-10-CM

## 2019-07-30 LAB — CBC WITH DIFFERENTIAL (CANCER CENTER ONLY)
Abs Immature Granulocytes: 0.01 10*3/uL (ref 0.00–0.07)
Basophils Absolute: 0 10*3/uL (ref 0.0–0.1)
Basophils Relative: 0 %
Eosinophils Absolute: 0.1 10*3/uL (ref 0.0–0.5)
Eosinophils Relative: 1 %
HCT: 38.6 % (ref 36.0–46.0)
Hemoglobin: 13 g/dL (ref 12.0–15.0)
Immature Granulocytes: 0 %
Lymphocytes Relative: 27 %
Lymphs Abs: 1.3 10*3/uL (ref 0.7–4.0)
MCH: 30.2 pg (ref 26.0–34.0)
MCHC: 33.7 g/dL (ref 30.0–36.0)
MCV: 89.8 fL (ref 80.0–100.0)
Monocytes Absolute: 0.3 10*3/uL (ref 0.1–1.0)
Monocytes Relative: 7 %
Neutro Abs: 3.2 10*3/uL (ref 1.7–7.7)
Neutrophils Relative %: 65 %
Platelet Count: 237 10*3/uL (ref 150–400)
RBC: 4.3 MIL/uL (ref 3.87–5.11)
RDW: 12.3 % (ref 11.5–15.5)
WBC Count: 4.9 10*3/uL (ref 4.0–10.5)
nRBC: 0 % (ref 0.0–0.2)

## 2019-07-30 LAB — CMP (CANCER CENTER ONLY)
ALT: 15 U/L (ref 0–44)
AST: 15 U/L (ref 15–41)
Albumin: 4.2 g/dL (ref 3.5–5.0)
Alkaline Phosphatase: 85 U/L (ref 38–126)
Anion gap: 12 (ref 5–15)
BUN: 19 mg/dL (ref 6–20)
CO2: 23 mmol/L (ref 22–32)
Calcium: 9.2 mg/dL (ref 8.9–10.3)
Chloride: 107 mmol/L (ref 98–111)
Creatinine: 1.15 mg/dL — ABNORMAL HIGH (ref 0.44–1.00)
GFR, Est AFR Am: >60 mL/min (ref >60)
GFR, Estimated: 52 mL/min — ABNORMAL LOW (ref >60)
Glucose, Bld: 88 mg/dL (ref 70–99)
Potassium: 3.8 mmol/L (ref 3.5–5.1)
Sodium: 142 mmol/L (ref 135–145)
Total Bilirubin: 0.3 mg/dL (ref 0.3–1.2)
Total Protein: 6.8 g/dL (ref 6.5–8.1)

## 2019-07-30 NOTE — Telephone Encounter (Signed)
Called patient to inform them of the scheduled appt date and time.  Left a voice message of the appt date and time.

## 2019-07-31 ENCOUNTER — Telehealth: Payer: Self-pay

## 2019-07-31 NOTE — Telephone Encounter (Signed)
-----   Message from Gardenia Phlegm, NP sent at 07/30/2019  5:04 PM EDT -----   Please call patient.  Tell her that her CBC is normal and f/u on her results.   ----- Message ----- From: Buel Ream, Lab In [place] Sent: 07/30/2019   4:44 PM EDT To: Gardenia Phlegm, NP

## 2019-07-31 NOTE — Telephone Encounter (Signed)
Left vm for patient to call back to inform of lab results.

## 2019-08-01 NOTE — Telephone Encounter (Signed)
Patient returned call and nurse went over lab results.  Patient voiced understanding and thanks for call.

## 2019-08-01 NOTE — Telephone Encounter (Signed)
-----   Message from Gardenia Phlegm, NP sent at 07/30/2019  5:04 PM EDT -----   Please call patient.  Tell her that her CBC is normal and f/u on her results.   ----- Message ----- From: Buel Ream, Lab In [place] Sent: 07/30/2019   4:44 PM EDT To: Gardenia Phlegm, NP

## 2019-08-03 ENCOUNTER — Ambulatory Visit (HOSPITAL_COMMUNITY)
Admission: RE | Admit: 2019-08-03 | Discharge: 2019-08-03 | Disposition: A | Discharge status: Home / Self Care | Payer: BC Managed Care – PPO | Source: Ambulatory Visit | Attending: Adult Health | Admitting: Adult Health

## 2019-08-03 ENCOUNTER — Other Ambulatory Visit: Payer: Self-pay

## 2019-08-03 DIAGNOSIS — Z170 Estrogen receptor positive status [ER+]: Secondary | ICD-10-CM | POA: Diagnosis present

## 2019-08-03 DIAGNOSIS — C50512 Malignant neoplasm of lower-outer quadrant of left female breast: Secondary | ICD-10-CM | POA: Diagnosis present

## 2019-08-03 MED ORDER — SODIUM CHLORIDE (PF) 0.9 % IJ SOLN
INTRAMUSCULAR | Status: AC
  Filled 2019-08-03: qty 50

## 2019-08-03 MED ORDER — IOHEXOL 300 MG/ML  SOLN
75.0000 mL | Freq: Once | INTRAMUSCULAR | Status: AC | PRN
  Administered 2019-08-03: 15:00:00 75 mL via INTRAVENOUS

## 2019-08-07 ENCOUNTER — Telehealth: Payer: Self-pay

## 2019-08-07 NOTE — Telephone Encounter (Signed)
-----   Message from Gardenia Phlegm, NP sent at 08/07/2019  7:43 AM EDT ----- Please call patient with results.  No cancer noted ----- Message ----- From: Interface, Rad Results In Sent: 08/05/2019   1:37 PM EDT To: Gardenia Phlegm, NP

## 2019-08-07 NOTE — Telephone Encounter (Signed)
LVM asking patient to call back to center.

## 2019-08-07 NOTE — Telephone Encounter (Signed)
Patient returned call.  Nurse informed patient that no cancer was noted on CT.  Patient voiced understanding and thanks for call.

## 2019-08-28 ENCOUNTER — Other Ambulatory Visit: Payer: Self-pay | Admitting: Hematology and Oncology

## 2019-08-28 DIAGNOSIS — C50512 Malignant neoplasm of lower-outer quadrant of left female breast: Secondary | ICD-10-CM

## 2019-09-04 ENCOUNTER — Telehealth: Payer: Self-pay | Admitting: Hematology and Oncology

## 2019-09-04 NOTE — Telephone Encounter (Signed)
Call day 12/29 moved f/u to 12/30. Left message for patient. Schedule mailed.

## 2019-10-09 ENCOUNTER — Ambulatory Visit: Payer: BC Managed Care – PPO | Admitting: Hematology and Oncology

## 2019-10-10 ENCOUNTER — Ambulatory Visit: Payer: BC Managed Care – PPO | Admitting: Hematology and Oncology

## 2019-10-26 ENCOUNTER — Telehealth: Payer: Self-pay | Admitting: Hematology and Oncology

## 2019-10-26 NOTE — Telephone Encounter (Signed)
Returned patient's phone call regarding cancelling an appointment, per patient's request 01/18 appointment has been cancelled. Patient will return phone call when ready to reschedule.

## 2019-10-29 ENCOUNTER — Ambulatory Visit: Payer: BC Managed Care – PPO | Admitting: Hematology and Oncology

## 2020-03-31 ENCOUNTER — Other Ambulatory Visit: Payer: Self-pay | Admitting: Pharmacist

## 2020-11-18 ENCOUNTER — Other Ambulatory Visit: Payer: Self-pay | Admitting: Hematology and Oncology

## 2020-11-18 DIAGNOSIS — C50512 Malignant neoplasm of lower-outer quadrant of left female breast: Secondary | ICD-10-CM

## 2020-11-19 ENCOUNTER — Telehealth: Payer: Self-pay | Admitting: Hematology and Oncology

## 2020-11-19 NOTE — Telephone Encounter (Signed)
Scheduled appt per 2/9 sch msg - unable to reach pt- left message for pt with appt date and time

## 2020-12-16 ENCOUNTER — Ambulatory Visit: Payer: BC Managed Care – PPO | Admitting: Hematology and Oncology

## 2020-12-17 ENCOUNTER — Other Ambulatory Visit: Payer: Self-pay | Admitting: Hematology and Oncology

## 2020-12-17 DIAGNOSIS — C50512 Malignant neoplasm of lower-outer quadrant of left female breast: Secondary | ICD-10-CM

## 2020-12-17 DIAGNOSIS — Z170 Estrogen receptor positive status [ER+]: Secondary | ICD-10-CM

## 2020-12-22 NOTE — Progress Notes (Signed)
Patient Care Team: Orpah Melter, MD as PCP - General (Family Medicine) Eppie Gibson, MD as Attending Physician (Radiation Oncology) Huel Cote, NP (Inactive) as Nurse Practitioner (Obstetrics and Gynecology) Nicholas Lose, MD as Consulting Physician (Hematology and Oncology) Bensimhon, Shaune Pascal, MD as Consulting Physician (Cardiology) Delice Bison, Charlestine Massed, NP as Nurse Practitioner (Hematology and Oncology) Rolm Bookbinder, MD as Consulting Physician (General Surgery)  DIAGNOSIS:    ICD-10-CM   1. Malignant neoplasm of lower-outer quadrant of left breast of female, estrogen receptor positive ([place])  C50.512    Z17.0     SUMMARY OF ONCOLOGIC HISTORY: Oncology History  Malignant neoplasm of lower-outer quadrant of left breast of female, estrogen receptor positive ([place])  03/01/2017 Initial Diagnosis   Left breast 2:30 position: 2.1 cm irregular solid mass, additional 0.8 cm oval mass in the left breast (fibrocystic); primary mass on biopsy revealed IDC grade 3, ER 100%, PR 80%, Ki-67 25%, HER-2 positive ratio 1.88, copy #6.2, T2 N0 stage II a clinical stage   03/29/2017 - 06/14/2017 Neo-Adjuvant Chemotherapy   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  The Multi-Gene Panel offered by [facility] includes sequencing and/or deletion duplication testing of the following 80 genes: ALK, APC, ATM, AXIN2,BAP1,  BARD1, BLM, BMPR1A, BRCA1, BRCA2, BRIP1, CASR, CDC73, CDH1, CDK4, CDKN1B, CDKN1C, CDKN2A (p14ARF), CDKN2A (p16INK4a), CEBPA, CHEK2, CTNNA1, DICER1, DIS3L2, EGFR (c.2369C>T, p.Thr790Met variant only), EPCAM (Deletion/duplication testing only), FH, FLCN, GATA2, GPC3, GREM1 (Promoter region deletion/duplication testing only), HOXB13 (c.251G>A, p.Gly84Glu), HRAS, KIT, MAX, MEN1, MET, MITF (c.952G>A, p.Glu318Lys variant only), MLH1, MSH2, MSH3, MSH6, MUTYH, NBN, NF1, NF2,  NTHL1, PALB2, PDGFRA, PHOX2B, PMS2, POLD1, POLE, POT1, PRKAR1A, PTCH1, PTEN, RAD50, RAD51C, RAD51D, RB1, RECQL4, RET, RUNX1, SDHAF2, SDHA (sequence changes only), SDHB, SDHC, SDHD, SMAD4, SMARCA4, SMARCB1, SMARCE1, STK11, SUFU, TERT, TERT, TMEM127, TP53, TSC1, TSC2, VHL, WRN and WT1.  The report date is April 27, 2017.    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 Left breast, 2.67 Gy in 15 fractions for a total dose of 40.05 Gy  2. Boost, 2 Gy in 5 fractions for a total dose of 10 Gy      11/25/2017 -  Anti-estrogen oral therapy   Letrozole daily     CHIEF COMPLIANT: Follow-up of left breast cancer on letrozole therapy  INTERVAL HISTORY: Haley Sanchez is a 60 y.o. with above-mentioned history of left breast cancer treated with neoadjuvant chemotherapy, lumpectomy, radiation, and is currently on letrozole therapy. She presents to the clinic today for follow-up.  Her major complaint today is severe fatigue.  She is tired all day.  She does not know if it is medications or something else causing her troubles.  She thinks that the letrozole might be part of the reason.  She feels hot all the time.  She keeps her house extremely cold.  ALLERGIES:  is allergic to no known allergies.  MEDICATIONS:  Current Outpatient Medications  Medication Sig Dispense Refill  . albuterol (PROVENTIL HFA;VENTOLIN HFA) 108 (90 Base) MCG/ACT inhaler Inhale 2 puffs into the lungs every 6 (six) hours as needed for wheezing or shortness of breath.    Marland Kitchen amLODipine (NORVASC) 10 MG tablet TAKE 1 TABLET  BY MOUTH EVERY DAY    . buPROPion (WELLBUTRIN XL) 300 MG 24 hr tablet Take 300 mg by mouth daily.    Marland Kitchen letrozole (FEMARA) 2.5 MG tablet TAKE 1 TABLET BY MOUTH EVERY DAY 30 tablet 0  . LORazepam (ATIVAN) 0.5 MG  tablet Take 0.5 mg by mouth 2 (two) times daily as needed.    Marland Kitchen losartan (COZAAR) 100 MG tablet Take 100 mg by mouth daily.   0  . PARoxetine (PAXIL) 40 MG tablet Take 40 mg by mouth every evening.     . Triamcinolone Acetonide (TRIAMCINOLONE 0.1 % CREAM : EUCERIN) CREA Apply 1 application topically 3 (three) times daily as needed. 1 each 1  . VYVANSE 50 MG capsule      No current facility-administered medications for this visit.    PHYSICAL EXAMINATION: ECOG PERFORMANCE STATUS: 1 - Symptomatic but completely ambulatory  Vitals:   12/23/20 1532  BP: (!) 150/91  Pulse: 91  Resp: 18  Temp: 98.1 F (36.7 C)  SpO2: 98%   Filed Weights   12/23/20 1532  Weight: 182 lb 14.4 oz (83 kg)    BREAST: No palpable masses or nodules in either right or left breasts. No palpable axillary supraclavicular or infraclavicular adenopathy no breast tenderness or nipple discharge. (exam performed in the presence of a chaperone)  LABORATORY DATA:  I have reviewed the data as listed CMP Latest Ref Rng & Units 07/30/2019 10/06/2018 03/02/2018  Glucose 70 - 99 mg/dL 88 92 87  BUN 6 - 20 mg/dL _0 Creatinine 0.44 - 1.00 mg/dL 1.15(H) 1.08(H) 1.05  Sodium 135 - 145 mmol/L 142 142 141  Potassium 3.5 - 5.1 mmol/L 3.8 4.4 4.3  Chloride 98 - 111 mmol/L 107 106 108  CO2 22 - 32 mmol/L _1 Calcium 8.9 - 10.3 mg/dL 9.2 9.1 8.9  Total Protein 6.5 - 8.1 g/dL 6.8 6.4(L) 6.3(L)  Total Bilirubin 0.3 - 1.2 mg/dL 0.3 0.3 0.3  Alkaline Phos 38 - 126 U/L 85 77 85  AST 15 - 41 U/L _2 ALT 0 - 44 U/L _3 Lab Results  Component Value Date   WBC 4.9 07/30/2019   HGB 13.0 07/30/2019   HCT 38.6 07/30/2019   MCV 89.8 07/30/2019   PLT 237 07/30/2019   NEUTROABS 3.2 07/30/2019    ASSESSMENT & PLAN:  Malignant neoplasm of lower-outer quadrant of left breast of female, estrogen receptor positive ([facility]) Left breast 2:30 position: 2.1 cm irregular solid mass, additional 0.8 cm oval mass in the  left breast (fibrocystic); primary mass on biopsy revealed IDC grade 3, ER 100%, PR 80%, Ki-67 25%, HER-2 positive ratio 1.88, copy #6.2, T2 N0 stage II a clinical stage  Treatment summary: 1. Neoadjuvant Taxol Herceptin weekly 11 completed 09/04/2018followed by Herceptin maintenancex 6 months completed 02/2018 2. 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 Adjuvant radiation therapy finished on 10/03/17 4. Letrozole started 11/25/17 (bone density 01/06/2018 was normal)  Letrozole toxicities: Patient states hot all the time Bone density 01/06/2018: T score -0.7: Normal Severe fatigue: I asked her to experiment by stopping the letrozole for a month to see how she feels.  If it is a letrozole causing her fatigue then she will take it every other  day. If it is not the letrozole then she will resume taking letrozole as prescribed.  Anxiety issues: Her mother is 46 years old and she is a caregiver.  I renewed her prescription for Ativan.  Breast cancer surveillance: 1.  Breast exam 12/23/2020: Benign 2.  Mammogram 05/29/2020: Benign breast density category B  Return to clinic in 1 year for follow-up    No orders of the defined types were placed in this encounter.  The patient has a good understanding of the overall plan. she agrees with it. she will call with any problems that may develop before the next visit here.  Total time spent: 20 mins including face to face time and time spent for planning, charting and coordination of care  Rulon Eisenmenger, MD, MPH 12/23/2020  I, Molly Dorshimer, am acting as scribe for Dr. Nicholas Lose.  I have reviewed the above documentation for accuracy and completeness, and I agree with the above.

## 2020-12-23 ENCOUNTER — Inpatient Hospital Stay: Payer: BC Managed Care – PPO | Attending: Hematology and Oncology | Admitting: Hematology and Oncology

## 2020-12-23 ENCOUNTER — Telehealth: Payer: Self-pay | Admitting: Hematology and Oncology

## 2020-12-23 ENCOUNTER — Other Ambulatory Visit: Payer: Self-pay

## 2020-12-23 DIAGNOSIS — R5383 Other fatigue: Secondary | ICD-10-CM | POA: Insufficient documentation

## 2020-12-23 DIAGNOSIS — Z923 Personal history of irradiation: Secondary | ICD-10-CM | POA: Diagnosis not present

## 2020-12-23 DIAGNOSIS — C50512 Malignant neoplasm of lower-outer quadrant of left female breast: Secondary | ICD-10-CM | POA: Insufficient documentation

## 2020-12-23 DIAGNOSIS — Z79899 Other long term (current) drug therapy: Secondary | ICD-10-CM | POA: Diagnosis not present

## 2020-12-23 DIAGNOSIS — Z79811 Long term (current) use of aromatase inhibitors: Secondary | ICD-10-CM | POA: Insufficient documentation

## 2020-12-23 DIAGNOSIS — R21 Rash and other nonspecific skin eruption: Secondary | ICD-10-CM

## 2020-12-23 DIAGNOSIS — Z170 Estrogen receptor positive status [ER+]: Secondary | ICD-10-CM | POA: Insufficient documentation

## 2020-12-23 DIAGNOSIS — F419 Anxiety disorder, unspecified: Secondary | ICD-10-CM | POA: Diagnosis not present

## 2020-12-23 MED ORDER — TRIAMCINOLONE 0.1 % CREAM:EUCERIN CREAM 1:1: 1.0000 "application " | TOPICAL_CREAM | Freq: Three times a day (TID) | CUTANEOUS | 3 refills | Status: DC | PRN

## 2020-12-23 MED ORDER — LORAZEPAM 0.5 MG PO TABS: 0.5000 mg | ORAL_TABLET | Freq: Every day | ORAL | 1 refills | Status: DC | PRN

## 2020-12-23 MED ORDER — LETROZOLE 2.5 MG PO TABS: 2.5000 mg | ORAL_TABLET | Freq: Every day | ORAL | 3 refills | Status: DC

## 2020-12-23 NOTE — Telephone Encounter (Signed)
Scheduled appt per 3/15 los. Pt aware.

## 2020-12-23 NOTE — Assessment & Plan Note (Signed)
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 Neoadjuvant Taxol Herceptin weekly 11 completed 09/04/2018followed by Herceptin maintenancex 6 months completed 02/2018 2. 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 Adjuvant radiation therapy finished on 10/03/17 4. Letrozole started 11/25/17 (bone density 01/06/2018 was normal)  Letrozole toxicities: Denies any hot flashes or myalgias Bone density 01/06/2018: T score -0.7: Normal  Breast cancer surveillance: 1.  Breast exam 12/23/2020: Benign 2.  Mammogram 05/29/2020: Benign breast density category B  Return to clinic in 1 year for follow-up

## 2021-03-13 ENCOUNTER — Encounter: Payer: Self-pay | Admitting: Adult Health

## 2021-04-10 ENCOUNTER — Encounter: Payer: Self-pay | Admitting: Oncology

## 2021-10-07 ENCOUNTER — Encounter: Payer: Self-pay | Admitting: Hematology and Oncology

## 2021-12-16 ENCOUNTER — Telehealth: Payer: Self-pay | Admitting: Hematology and Oncology

## 2021-12-16 NOTE — Telephone Encounter (Signed)
Rescheduled appointment per provider. Left message. Patient will be mailed an updated calendar. ?

## 2021-12-30 ENCOUNTER — Inpatient Hospital Stay: Payer: BC Managed Care – PPO | Admitting: Hematology and Oncology

## 2022-01-04 NOTE — Progress Notes (Signed)
? ?Patient Care Team: ?Orpah Melter, MD as PCP - General (Family Medicine) ?Eppie Gibson, MD as Attending Physician (Radiation Oncology) ?Huel Cote, NP (Inactive) as Nurse Practitioner (Obstetrics and Gynecology) ?Nicholas Lose, MD as Consulting Physician (Hematology and Oncology) ?Bensimhon, Shaune Pascal, MD as Consulting Physician (Cardiology) ?Gardenia Phlegm, NP as Nurse Practitioner (Hematology and Oncology) ?Rolm Bookbinder, MD as Consulting Physician (General Surgery) ? ?DIAGNOSIS:  ?Encounter Diagnosis  ?Name Primary?  ? Malignant neoplasm of lower-outer quadrant of left breast of female, estrogen receptor positive ([place])   ? ? ?SUMMARY OF ONCOLOGIC HISTORY: ?Oncology History  ?Malignant neoplasm of lower-outer quadrant of left breast of female, estrogen receptor positive ([place])  ?03/01/2017 Initial Diagnosis  ? Left breast 2:30 position: 2.1 cm irregular solid mass, additional 0.8 cm oval mass in the left breast (fibrocystic); primary mass on biopsy revealed IDC grade 3, ER 100%, PR 80%, Ki-67 25%, HER-2 positive ratio 1.88, copy #6.2, T2 N0 stage II a clinical stage ?  ?03/29/2017 - 06/14/2017 Neo-Adjuvant Chemotherapy  ? Neoadjuvant chemotherapy with Taxol Herceptin ?11 stopped early due to neuropathy ?  ?04/27/2017 Genetic Testing  ? Negative genetic testing on the multi-gene panel.  The Multi-Gene Panel offered by [facility] includes sequencing and/or deletion duplication testing of the following 80 genes: ALK, APC, ATM, AXIN2,BAP1,  BARD1, BLM, BMPR1A, BRCA1, BRCA2, BRIP1, CASR, CDC73, CDH1, CDK4, CDKN1B, CDKN1C, CDKN2A (p14ARF), CDKN2A (p16INK4a), CEBPA, CHEK2, CTNNA1, DICER1, DIS3L2, EGFR (c.2369C>T, p.Thr790Met variant only), EPCAM (Deletion/duplication testing only), FH, FLCN, GATA2, GPC3, GREM1 (Promoter region deletion/duplication testing only), HOXB13 (c.251G>A, p.Gly84Glu), HRAS, KIT, MAX, MEN1, MET, MITF (c.952G>A, p.Glu318Lys variant only), MLH1, MSH2, MSH3, MSH6, MUTYH, NBN, NF1,  NF2, NTHL1, PALB2, PDGFRA, PHOX2B, PMS2, POLD1, POLE, POT1, PRKAR1A, PTCH1, PTEN, RAD50, RAD51C, RAD51D, RB1, RECQL4, RET, RUNX1, SDHAF2, SDHA (sequence changes only), SDHB, SDHC, SDHD, SMAD4, SMARCA4, SMARCB1, SMARCE1, STK11, SUFU, TERT, TERT, TMEM127, TP53, TSC1, TSC2, VHL, WRN and WT1.  The report date is April 27, 2017. ? ?  ?07/21/2017 Surgery  ? Left lumpectomy: Grade 2 IDC 1.9 cm with DCIS, margins negative, 0/4 lymph nodes negative, ER 100%, PR 80%, HER-2 positive, Ki-67 25%, RCB class II,T1c N0 stage IA ?  ?08/29/2017 - 10/03/2017 Radiation Therapy  ? Adjuvant radiation:1. Left breast, 2.67 Gy in 15 fractions for a total dose of 40.05 Gy  2. Boost, 2 Gy in 5 fractions for a total dose of 10 Gy ? ?  ?  ?11/25/2017 -  Anti-estrogen oral therapy  ? Letrozole daily ?  ? ? ?CHIEF COMPLIANT: Follow-up of left breast cancer on letrozole therapy ? ?INTERVAL HISTORY: Haley Sanchez is a  61 y.o. with above-mentioned history of left breast cancer treated with neoadjuvant chemotherapy, lumpectomy, radiation, and is currently on letrozole therapy. She presents to the clinic today for annual follow-up. She states that the hot flashes calmed down. Denies joint pain.  She denies any lumps or nodules in the breast.  She still cares for her mother. ? ?ALLERGIES:  is allergic to no known allergies. ? ?MEDICATIONS:  ?Current Outpatient Medications  ?Medication Sig Dispense Refill  ? albuterol (PROVENTIL HFA;VENTOLIN HFA) 108 (90 Base) MCG/ACT inhaler Inhale 2 puffs into the lungs every 6 (six) hours as needed for wheezing or shortness of breath.    ? amLODipine (NORVASC) 10 MG tablet TAKE 1 TABLET BY MOUTH EVERY DAY    ? buPROPion (WELLBUTRIN XL) 300 MG 24 hr tablet Take 300 mg by mouth daily.    ? letrozole (FEMARA) 2.5 MG  tablet Take 1 tablet (2.5 mg total) by mouth daily. 90 tablet 3  ? LORazepam (ATIVAN) 0.5 MG tablet Take 1 tablet (0.5 mg total) by mouth daily as needed. 30 tablet 1  ? losartan (COZAAR) 100 MG tablet Take  100 mg by mouth daily.   0  ? PARoxetine (PAXIL) 40 MG tablet Take 40 mg by mouth every evening.     ? Triamcinolone Acetonide (TRIAMCINOLONE 0.1 % CREAM : EUCERIN) CREA Apply 1 application topically 3 (three) times daily as needed. 1 each 3  ? ?No current facility-administered medications for this visit.  ? ? ?PHYSICAL EXAMINATION: ?ECOG PERFORMANCE STATUS: 1 - Symptomatic but completely ambulatory ? ?Vitals:  ? 01/05/22 1537  ?BP: 139/82  ?Pulse: 75  ?Resp: 18  ?Temp: (!) 97.5 ?F (36.4 ?C)  ?SpO2: 98%  ? ?Filed Weights  ? 01/05/22 1537  ?Weight: 189 lb 12.8 oz (86.1 kg)  ? ? ?BREAST: No palpable masses or nodules in either right or left breasts. No palpable axillary supraclavicular or infraclavicular adenopathy no breast tenderness or nipple discharge. (exam performed in the presence of a chaperone) ? ?LABORATORY DATA:  ?I have reviewed the data as listed ? ?  Latest Ref Rng & Units 07/30/2019  ?  3:25 PM 10/06/2018  ? 10:42 AM 03/02/2018  ?  9:14 AM  ?CMP  ?Glucose 70 - 99 mg/dL 88   92   87    ?BUN 6 - 20 mg/dL 19   15   18     ?Creatinine 0.44 - 1.00 mg/dL 1.15   1.08   1.05    ?Sodium 135 - 145 mmol/L 142   142   141    ?Potassium 3.5 - 5.1 mmol/L 3.8   4.4   4.3    ?Chloride 98 - 111 mmol/L 107   106   108    ?CO2 22 - 32 mmol/L 23   29   26     ?Calcium 8.9 - 10.3 mg/dL 9.2   9.1   8.9    ?Total Protein 6.5 - 8.1 g/dL 6.8   6.4   6.3    ?Total Bilirubin 0.3 - 1.2 mg/dL 0.3   0.3   0.3    ?Alkaline Phos 38 - 126 U/L 85   77   85    ?AST 15 - 41 U/L 15   17   16     ?ALT 0 - 44 U/L 15   18   16     ? ? ?Lab Results  ?Component Value Date  ? WBC 4.9 07/30/2019  ? HGB 13.0 07/30/2019  ? HCT 38.6 07/30/2019  ? MCV 89.8 07/30/2019  ? PLT 237 07/30/2019  ? NEUTROABS 3.2 07/30/2019  ? ? ?ASSESSMENT & PLAN:  ?Malignant neoplasm of lower-outer quadrant of left breast of female, estrogen receptor positive ([place]) ?Left breast 2:30 position: 2.1 cm irregular solid mass, additional 0.8 cm oval mass in the left breast  (fibrocystic); primary mass on biopsy revealed IDC grade 3, ER 100%, PR 80%, Ki-67 25%, HER-2 positive ratio 1.88, copy #6.2, T2 N0 stage II a clinical stage ?  ?Treatment summary: ?1. Neoadjuvant Taxol Herceptin weekly ?11 completed 06/14/2017 followed by Herceptin maintenance x 6 months completed 02/2018 ?2. breast conserving surgery with sentinel lymph node biopsy done on 07/21/2017 ?07/21/2017 Left lumpectomy: Grade 2 IDC 1.9 cm with DCIS, margins negative, 0/4 lymph nodes negative, ER 100%, PR 80%, HER-2 positive, Ki-67 25%, RCB class II,T1c N0 stage IA ?3. Adjuvant  radiation therapy finished on 10/03/17 ?4. Letrozole started 11/25/17 (bone density 01/06/2018 was normal) ?-------------------------------------------------------------------------------------------------------------------------------------------------------------------------------------------------------------------  ?Letrozole toxicities: Patient states hot all the time ?Bone density 01/06/2018: T score -0.7: Normal ?Anxiety issues: Her mother is 56 years old and she is a caregiver.   ? 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  ?Breast cancer surveillance: ?1.  Breast exam  01/05/2022: Benign ?2.  Mammogram  10/26/2021: [facility] benign breast density category B ?  ?Return to clinic in 1 year for follow-up ? ?No orders of the defined types were placed in this encounter. ? ?The patient has a good understanding of the overall plan. she agrees with it. she will call with any problems that may develop before the next visit here. ?Total time spent: 30 mins including face to face time and time spent for planning, charting and co-ordination of care ? ? Harriette Ohara, MD ?01/05/22 ? ? ? I Gardiner Coins am scribing for Dr. Lindi Adie ? ?I have reviewed the above documentation for accuracy and completeness, and I agree with the above. ?  ?

## 2022-01-05 ENCOUNTER — Other Ambulatory Visit: Payer: Self-pay

## 2022-01-05 ENCOUNTER — Inpatient Hospital Stay: Payer: BC Managed Care – PPO | Attending: Hematology and Oncology | Admitting: Hematology and Oncology

## 2022-01-05 DIAGNOSIS — Z79811 Long term (current) use of aromatase inhibitors: Secondary | ICD-10-CM | POA: Diagnosis not present

## 2022-01-05 DIAGNOSIS — C50512 Malignant neoplasm of lower-outer quadrant of left female breast: Secondary | ICD-10-CM | POA: Diagnosis present

## 2022-01-05 DIAGNOSIS — Z170 Estrogen receptor positive status [ER+]: Secondary | ICD-10-CM | POA: Insufficient documentation

## 2022-01-05 MED ORDER — LETROZOLE 2.5 MG PO TABS: 2.5000 mg | ORAL_TABLET | Freq: Every day | ORAL | 3 refills | Status: DC

## 2022-01-05 NOTE — Assessment & Plan Note (Signed)
Left breast 2:30 position: 2.1 cm irregular solid mass, additional 0.8 cm oval mass in the left breast (fibrocystic); primary mass on biopsy revealed IDC grade 3, ER 100%, PR 80%, Ki-67 25%, HER-2 positive ratio 1.88, copy #6.2, T2 N0 stage II a clinical stage ?? ?Treatment summary: ?1. Neoadjuvant Taxol Herceptin weekly ?11 completed 06/14/2017?followed by Herceptin maintenance?x 6 months completed 02/2018 ?2. breast conserving surgery with sentinel lymph node biopsy?done on 07/21/2017 ?07/21/2017?Left lumpectomy: Grade 2 IDC 1.9 cm with DCIS, margins negative, 0/4 lymph nodes negative, ER 100%, PR 80%, HER-2 positive, Ki-67 25%, RCB class II,T1c N0 stage IA ?3. Adjuvant radiation therapy finished on 10/03/17 ?4. Letrozole started 11/25/17 (bone density 01/06/2018 was normal) ?? ?Letrozole toxicities: Patient states hot all the time ?Bone density 01/06/2018: T score -0.7: Normal ?Severe fatigue: I asked her to experiment by stopping the letrozole for a month to see how she feels.  If it is a letrozole causing her fatigue then she will take it every other day. ?If it is not the letrozole then she will resume taking letrozole as prescribed. ?? ?Anxiety issues: Her mother is 16 years old and she is a caregiver.  I renewed her prescription for Ativan. ?? ?Breast cancer surveillance: ?1.??Breast exam  01/05/2022: Benign ?2.??Mammogram  10/26/2021: [facility] benign breast density category B ?? ?Return to clinic in 1 year for follow-up ?

## 2022-09-16 LAB — COLOGUARD
COLOGUARD: NEGATIVE
Cologuard: NEGATIVE

## 2023-01-10 ENCOUNTER — Inpatient Hospital Stay: Payer: BC Managed Care – PPO | Attending: Hematology and Oncology | Admitting: Hematology and Oncology

## 2023-01-10 NOTE — Assessment & Plan Note (Deleted)
Left breast 2:30 position: 2.1 cm irregular solid mass, additional 0.8 cm oval mass in the left breast (fibrocystic); primary mass on biopsy revealed IDC grade 3, ER 100%, PR 80%, Ki-67 25%, HER-2 positive ratio 1.88, copy #6.2, T2 N0 stage II a clinical stage   Treatment summary: 1. Neoadjuvant Taxol Herceptin weekly 11 completed 06/14/2017 followed by Herceptin maintenance x 6 months completed 02/2018 2. breast conserving surgery with sentinel lymph node biopsy done on 07/21/2017 07/21/2017 Left lumpectomy: Grade 2 IDC 1.9 cm with DCIS, margins negative, 0/4 lymph nodes negative, ER 100%, PR 80%, HER-2 positive, Ki-67 25%, RCB class II,T1c N0 stage IA 3. Adjuvant radiation therapy finished on 10/03/17 4. Letrozole started 11/25/17 (bone density 01/06/2018 was normal) -------------------------------------------------------------------------------------------------------------------------------------------------------------------------------------------------------------------  Letrozole toxicities: Patient states hot all the time Bone density 01/06/2018: T score -0.7: Normal Anxiety issues: Her mother is 52 years old and she is a caregiver.     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  Breast exam 01/10/2023: Benign 2.  Mammogram  10/26/2021: [facility] benign breast density category B, she needs a new mammogram this year.   Return to clinic in 1 year for follow-up

## 2023-02-21 ENCOUNTER — Other Ambulatory Visit: Payer: Self-pay | Admitting: Hematology and Oncology

## 2023-02-21 ENCOUNTER — Telehealth: Payer: Self-pay

## 2023-02-21 DIAGNOSIS — C50512 Malignant neoplasm of lower-outer quadrant of left female breast: Secondary | ICD-10-CM

## 2023-02-21 NOTE — Telephone Encounter (Signed)
Called and LVM regarding letrozole rx refill request. Advised Pt that we will refill for 1 month supply but that Pt needs to be seen in office to continue refilling as it has been over a year since last OV. Message sent to scheduling.

## 2023-02-22 ENCOUNTER — Telehealth: Payer: Self-pay | Admitting: Hematology and Oncology

## 2023-03-16 ENCOUNTER — Other Ambulatory Visit: Payer: Self-pay | Admitting: Hematology and Oncology

## 2023-03-16 DIAGNOSIS — Z170 Estrogen receptor positive status [ER+]: Secondary | ICD-10-CM

## 2023-04-04 ENCOUNTER — Telehealth: Payer: Self-pay | Admitting: Hematology and Oncology

## 2023-04-04 ENCOUNTER — Inpatient Hospital Stay: Payer: BC Managed Care – PPO | Admitting: Hematology and Oncology

## 2023-04-04 NOTE — Assessment & Plan Note (Deleted)
Left breast 2:30 position: 2.1 cm irregular solid mass, additional 0.8 cm oval mass in the left breast (fibrocystic); primary mass on biopsy revealed IDC grade 3, ER 100%, PR 80%, Ki-67 25%, HER-2 positive ratio 1.88, copy #6.2, T2 N0 stage II a clinical stage   Treatment summary: 1. Neoadjuvant Taxol Herceptin weekly 11 completed 06/14/2017 followed by Herceptin maintenance x 6 months completed 02/2018 2. breast conserving surgery with sentinel lymph node biopsy done on 07/21/2017 07/21/2017 Left lumpectomy: Grade 2 IDC 1.9 cm with DCIS, margins negative, 0/4 lymph nodes negative, ER 100%, PR 80%, HER-2 positive, Ki-67 25%, RCB class II,T1c N0 stage IA 3. Adjuvant radiation therapy finished on 10/03/17 4. Letrozole started 11/25/17 (bone density 01/06/2018 was normal) -------------------------------------------------------------------------------------------------------------------------------------------------------------------------------------------------------------------  Letrozole toxicities: Patient states hot all the time Bone density 01/06/2018: T score -0.7: Normal Anxiety issues: Her mother is 42 years old and she is a caregiver.     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  Breast exam 04/04/2023: Benign 2.  Mammogram 10/26/2021: [facility] benign breast density category B   Return to clinic in 1 year for follow-up

## 2023-04-25 ENCOUNTER — Other Ambulatory Visit: Payer: Self-pay | Admitting: Hematology and Oncology

## 2023-04-25 DIAGNOSIS — Z170 Estrogen receptor positive status [ER+]: Secondary | ICD-10-CM

## 2023-04-25 MED ORDER — LETROZOLE 2.5 MG PO TABS
2.5000 mg | ORAL_TABLET | Freq: Every day | ORAL | 0 refills | Status: DC
Start: 2023-04-25 — End: 2023-07-29

## 2023-04-27 ENCOUNTER — Encounter: Payer: Self-pay | Admitting: Hematology and Oncology

## 2023-04-29 ENCOUNTER — Inpatient Hospital Stay: Payer: BC Managed Care – PPO | Attending: Hematology and Oncology | Admitting: Hematology and Oncology

## 2023-04-29 NOTE — Assessment & Plan Note (Deleted)
Left breast 2:30 position: 2.1 cm irregular solid mass, additional 0.8 cm oval mass in the left breast (fibrocystic); primary mass on biopsy revealed IDC grade 3, ER 100%, PR 80%, Ki-67 25%, HER-2 positive ratio 1.88, copy #6.2, T2 N0 stage II a clinical stage   Treatment summary: 1. Neoadjuvant Taxol Herceptin weekly 11 completed 06/14/2017 followed by Herceptin maintenance x 6 months completed 02/2018 2. breast conserving surgery with sentinel lymph node biopsy done on 07/21/2017 07/21/2017 Left lumpectomy: Grade 2 IDC 1.9 cm with DCIS, margins negative, 0/4 lymph nodes negative, ER 100%, PR 80%, HER-2 positive, Ki-67 25%, RCB class II,T1c N0 stage IA 3. Adjuvant radiation therapy finished on 10/03/17 4. Letrozole started 11/25/17 (bone density 01/06/2018 was normal) -------------------------------------------------------------------------------------------------------------------------------------------------------------------------------------------------------------------  Letrozole toxicities: Patient states hot all the time Bone density 01/06/2018: T score -0.7: Normal Anxiety issues: Her mother is 72 years old and she is a caregiver.     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  Breast exam  04/29/2023: Benign 2.  Mammogram  04/25/2023: [facility] benign breast density category B   Return to clinic in 1 year for follow-up

## 2023-05-02 ENCOUNTER — Encounter: Payer: Self-pay | Admitting: Hematology and Oncology

## 2023-05-18 ENCOUNTER — Other Ambulatory Visit: Payer: Self-pay | Admitting: Radiology

## 2023-06-02 ENCOUNTER — Encounter: Payer: Self-pay | Admitting: Hematology and Oncology

## 2023-06-28 ENCOUNTER — Encounter: Payer: Self-pay | Admitting: Family Medicine

## 2023-06-28 ENCOUNTER — Ambulatory Visit: Payer: BC Managed Care – PPO | Admitting: Family Medicine

## 2023-06-28 ENCOUNTER — Encounter: Payer: Self-pay | Admitting: Oncology

## 2023-06-28 VITALS — BP 134/85 | HR 94 | Temp 98.2°F | Ht 65.0 in | Wt 183.4 lb

## 2023-06-28 DIAGNOSIS — F339 Major depressive disorder, recurrent, unspecified: Secondary | ICD-10-CM | POA: Diagnosis not present

## 2023-06-28 DIAGNOSIS — M818 Other osteoporosis without current pathological fracture: Secondary | ICD-10-CM | POA: Diagnosis not present

## 2023-06-28 DIAGNOSIS — E538 Deficiency of other specified B group vitamins: Secondary | ICD-10-CM

## 2023-06-28 DIAGNOSIS — Z23 Encounter for immunization: Secondary | ICD-10-CM | POA: Diagnosis not present

## 2023-06-28 DIAGNOSIS — F32A Depression, unspecified: Secondary | ICD-10-CM | POA: Insufficient documentation

## 2023-06-28 DIAGNOSIS — Z7689 Persons encountering health services in other specified circumstances: Secondary | ICD-10-CM

## 2023-06-28 DIAGNOSIS — I10 Essential (primary) hypertension: Secondary | ICD-10-CM | POA: Diagnosis not present

## 2023-06-28 MED ORDER — FLUOXETINE HCL 20 MG PO CAPS: 20.0000 mg | ORAL_CAPSULE | Freq: Every day | ORAL | 1 refills | Status: DC

## 2023-06-28 MED ORDER — LOSARTAN POTASSIUM 100 MG PO TABS: 100.0000 mg | ORAL_TABLET | Freq: Every day | ORAL | 1 refills | Status: DC

## 2023-06-28 MED ORDER — AMLODIPINE BESYLATE 10 MG PO TABS: 10.0000 mg | ORAL_TABLET | Freq: Every day | ORAL | 1 refills | Status: DC

## 2023-06-28 MED ORDER — BUPROPION HCL ER (XL) 300 MG PO TB24: 300.0000 mg | ORAL_TABLET | Freq: Every day | ORAL | 1 refills | Status: DC

## 2023-06-28 NOTE — Progress Notes (Unsigned)
Patient ID: Haley Sanchez, female  DOB: 07-Jun-1961, 62 y.o.   MRN: [id number] Patient Care Team    Relationship Specialty Notifications Start End  Natalia Leatherwood, DO PCP - General Family Medicine  06/28/23   Lonie Peak, MD Attending Physician Radiation Oncology  03/08/17   Harrington Challenger, NP (Inactive) Nurse Practitioner Obstetrics and Gynecology  03/09/17   Serena Croissant, MD Consulting Physician Hematology and Oncology  11/22/18   Bensimhon, Bevelyn Buckles, MD Consulting Physician Cardiology  11/22/18   Loa Socks, NP Nurse Practitioner Hematology and Oncology  11/22/18   Emelia Loron, MD Consulting Physician General Surgery  11/24/18   Serena Croissant, MD Consulting Physician Hematology and Oncology  06/27/23   Mercy Moore, MD Referring Physician   06/29/23     Chief Complaint  Patient presents with   Establish Care   Anxiety    Mother pasted recently and starting school as a teacher    Subjective:  Haley Sanchez is a 62 y.o.  female present for new patient establishment. All past medical history, surgical history, allergies, family history, immunizations, medications and social history were updated in the electronic medical record today. All recent labs, ED visits and hospitalizations within the last year were reviewed.  Overdue for onc follow up.   Primary hypertension ***  Recurrent major depressive disorder, remission status unspecified (HCC) ***      06/28/2023    2:12 PM 11/09/2017   11:29 AM 08/05/2017    8:39 AM  Depression screen PHQ 2/9  Decreased Interest 1 0 0  Down, Depressed, Hopeless 2 0 0  PHQ - 2 Score 3 0 0  Altered sleeping 3    Change in appetite 2    Feeling bad or failure about yourself  0    Trouble concentrating 3    Moving slowly or fidgety/restless 0    PHQ-9 Score 11    Difficult doing work/chores Somewhat difficult        06/28/2023    2:12 PM  GAD 7 : Generalized Anxiety Score  Nervous, Anxious, on Edge 3   Control/stop worrying 3  Worry too much - different things 3  Trouble relaxing 3  Restless 1  Easily annoyed or irritable 1  Afraid - awful might happen 1  Total GAD 7 Score 15  Anxiety Difficulty Somewhat difficult          06/28/2023    2:11 PM 11/09/2017   11:29 AM 08/05/2017    8:39 AM  Fall Risk   Falls in the past year? 0 No No  Number falls in past yr: 0    Injury with Fall? 0       Immunization History  Administered Date(s) Administered   Influenza, Seasonal, Injecte, Preservative Fre 06/28/2023   Influenza,inj,Quad PF,6+ Mos 10/28/2017, 06/15/2019    No results found.  Past Medical History:  Diagnosis Date   Anxiety    Asthma    Cancer (HCC)    breast cancer   Complication of anesthesia    Depression    Family history of breast cancer    Family history of prostate cancer    History of radiation therapy 08/29/2017- 10/03/17   Left Breast 40.05 Gy in 15 fractions followed by a boost of 10 Gy per fraction.    Hypertension    PONV (postoperative nausea and vomiting)    Port catheter REMOVED 04/05/2017   Seasonal allergies    Allergies  Allergen Reactions  No Known Allergies    Past Surgical History:  Procedure Laterality Date   BREAST LUMPECTOMY WITH RADIOACTIVE SEED AND SENTINEL LYMPH NODE BIOPSY Left 07/21/2017   Procedure: BREAST LUMPECTOMY WITH RADIOACTIVE SEED AND SENTINEL LYMPH NODE BIOPSY;  Surgeon: Emelia Loron, MD;  Location: [facility];  Service: General;  Laterality: Left;   PORTACATH PLACEMENT Right 03/28/2017   Procedure: INSERTION PORT-A-CATH WITH [place];  Surgeon: Emelia Loron, MD;  Location: [facility] OR;  Service: General;  Laterality: Right;   Family History  Problem Relation Age of Onset   Heart disease Mother    Hypertension Mother    Kidney failure Father    Diabetes Father    Hypertension Father    Prostate cancer Father 38   Brain cancer Maternal Grandmother 4   Prostate cancer Maternal Grandfather         dx in his 52s   Breast cancer Paternal Grandmother    Alcohol abuse Maternal Aunt    Other Maternal Uncle        died in MVA   Breast cancer Cousin        maternal first cousin dx in her 19s   Mental retardation Cousin    Breast cancer Other        maternal second cousin   Breast cancer Cousin        paternal first cousin   Breast cancer Cousin        paternal first cousin   Social History   Social History Narrative   Marital status/children/pets: Married.   Education/employment: Masters degree.  Works as a Engineer, agricultural:      -smoke alarm in the home:Yes     - wears seatbelt: Yes     - Feels safe in their relationships: No       Allergies as of 06/28/2023       Reactions   No Known Allergies         Medication List        Accurate as of June 28, 2023 11:59 PM. If you have any questions, ask your nurse or doctor.          STOP taking these medications    ALPRAZolam 0.5 MG tablet Commonly known as: XANAX Stopped by: Felix Pacini   LORazepam 0.5 MG tablet Commonly known as: ATIVAN Stopped by: Felix Pacini   PARoxetine 40 MG tablet Commonly known as: PAXIL Stopped by: Felix Pacini       TAKE these medications    albuterol 108 (90 Base) MCG/ACT inhaler Commonly known as: VENTOLIN HFA Inhale 2 puffs into the lungs every 6 (six) hours as needed for wheezing or shortness of breath.   amLODipine 10 MG tablet Commonly known as: NORVASC Take 1 tablet (10 mg total) by mouth daily.   buPROPion 300 MG 24 hr tablet Commonly known as: WELLBUTRIN XL Take 1 tablet (300 mg total) by mouth daily.   FLUoxetine 20 MG capsule Commonly known as: PROZAC Take 1 capsule (20 mg total) by mouth daily.   hydrOXYzine 25 MG tablet Commonly known as: ATARAX Take 25 mg by mouth 4 (four) times daily as needed.   letrozole 2.5 MG tablet Commonly known as: FEMARA Take 1 tablet (2.5 mg total) by mouth daily.   losartan 100 MG tablet Commonly known as:  COZAAR Take 1 tablet (100 mg total) by mouth daily.   triamcinolone 0.1 % cream : eucerin Crea Apply 1 application topically 3 (three) times daily as  needed.        All past medical history, surgical history, allergies, family history, immunizations andmedications were updated in the EMR today and reviewed under the history and medication portions of their EMR.    Recent Results (from the past 2160 hour(s))  CBC     Status: None   Collection Time: 06/28/23  2:29 PM  Result Value Ref Range   WBC 5.2 4.0 - 10.5 K/uL   RBC 4.67 3.87 - 5.11 Mil/uL   Platelets 265.0 150.0 - 400.0 K/uL   Hemoglobin 13.8 12.0 - 15.0 g/dL   HCT 36.6 44.0 - 34.7 %   MCV 91.6 78.0 - 100.0 fl   MCHC 32.4 30.0 - 36.0 g/dL   RDW 42.5 95.6 - 38.7 %  Comp Met (CMET)     Status: Abnormal   Collection Time: 06/28/23  2:29 PM  Result Value Ref Range   Sodium 139 135 - 145 mEq/L   Potassium 3.9 3.5 - 5.1 mEq/L   Chloride 101 96 - 112 mEq/L   CO2 30 19 - 32 mEq/L   Glucose, Bld 88 70 - 99 mg/dL   BUN 19 6 - 23 mg/dL   Creatinine, Ser 5.64 (H) 0.40 - 1.20 mg/dL   Total Bilirubin 0.5 0.2 - 1.2 mg/dL   Alkaline Phosphatase 72 39 - 117 U/L   AST 16 0 - 37 U/L   ALT 12 0 - 35 U/L   Total Protein 6.5 6.0 - 8.3 g/dL   Albumin 4.3 3.5 - 5.2 g/dL   GFR 33.29 (L) >51.88 mL/min    Comment: Calculated using the CKD-EPI Creatinine Equation (2021)   Calcium 9.4 8.4 - 10.5 mg/dL  TSH     Status: None   Collection Time: 06/28/23  2:29 PM  Result Value Ref Range   TSH 1.60 0.35 - 5.50 uIU/mL  B12 and Folate Panel     Status: Abnormal   Collection Time: 06/28/23  2:29 PM  Result Value Ref Range   Vitamin B-12 200 (L) 211 - 911 pg/mL   Folate 13.6 >5.9 ng/mL  Vitamin D (25 hydroxy)     Status: None   Collection Time: 06/28/23  2:29 PM  Result Value Ref Range   VITD 45.01 30.00 - 100.00 ng/mL      ROS 14 pt review of systems performed and negative (unless mentioned in an HPI)  Objective: BP 134/85   Pulse 94    Temp 98.2 F (36.8 C)   Ht 5\' 5"  (1.651 m)   Wt 183 lb 6.4 oz (83.2 kg)   SpO2 97%   BMI 30.52 kg/m  Physical Exam Vitals and nursing note reviewed.  Constitutional:      General: She is not in acute distress.    Appearance: Normal appearance. She is not ill-appearing, toxic-appearing or diaphoretic.  HENT:     Head: Normocephalic and atraumatic.  Eyes:     General: No scleral icterus.       Right eye: No discharge.        Left eye: No discharge.     Extraocular Movements: Extraocular movements intact.     Conjunctiva/sclera: Conjunctivae normal.     Pupils: Pupils are equal, round, and reactive to light.  Cardiovascular:     Rate and Rhythm: Normal rate and regular rhythm.  Pulmonary:     Effort: Pulmonary effort is normal. No respiratory distress.     Breath sounds: Normal breath sounds. No wheezing, rhonchi or rales.  Musculoskeletal:  Right lower leg: No edema.     Left lower leg: No edema.  Skin:    General: Skin is warm.     Findings: No rash.  Neurological:     Mental Status: She is alert and oriented to person, place, and time. Mental status is at baseline.     Motor: No weakness.     Gait: Gait normal.  Psychiatric:        Mood and Affect: Mood normal.        Behavior: Behavior normal.        Thought Content: Thought content normal.        Judgment: Judgment normal.        Assessment/plan: Mayte H Nicholl is a 62 y.o. female present for est care Establishing care with new doctor, encounter for Primary hypertension Stable Continue amlodipine 10 mg daily Continue losartan 100 mg daily Awaiting records from prior PCP CBC, CMP, TSH collected today Recurrent major depressive disorder, remission status unspecified (HCC) Stable Continue fluoxetine 20 mg daily Continue Wellbutrin 300 mg daily  B12 deficiency - B12 and Folate Panel  osteoporosis without current pathological fracture - Vitamin D (25 hydroxy)  Need for influenza vaccination - Flu  vaccine trivalent PF, 6mos and older(Flulaval,Afluria,Fluarix,Fluzone)  Return for we will discuss follow up after lab results received.  Orders Placed This Encounter  Procedures   Flu vaccine trivalent PF, 6mos and older(Flulaval,Afluria,Fluarix,Fluzone)   CBC   Comp Met (CMET)   TSH   B12 and Folate Panel   Vitamin D (25 hydroxy)   Meds ordered this encounter  Medications   FLUoxetine (PROZAC) 20 MG capsule    Sig: Take 1 capsule (20 mg total) by mouth daily.    Dispense:  90 capsule    Refill:  1   buPROPion (WELLBUTRIN XL) 300 MG 24 hr tablet    Sig: Take 1 tablet (300 mg total) by mouth daily.    Dispense:  90 tablet    Refill:  1   amLODipine (NORVASC) 10 MG tablet    Sig: Take 1 tablet (10 mg total) by mouth daily.    Dispense:  90 tablet    Refill:  1   losartan (COZAAR) 100 MG tablet    Sig: Take 1 tablet (100 mg total) by mouth daily.    Dispense:  90 tablet    Refill:  1   Referral Orders  No referral(s) requested today     Note is dictated utilizing voice recognition software. Although note has been proof read prior to signing, occasional typographical errors still can be missed. If any questions arise, please do not hesitate to call for verification.  Electronically signed by: Felix Pacini, DO [facility] [place]

## 2023-06-28 NOTE — Patient Instructions (Addendum)
Return for we will discuss follow up after lab results received.        Great to see you today.  I have refilled the medication(s) we provide.   If labs were collected or images ordered, we will inform you of  results once we have received them and reviewed. We will contact you either by echart message, or telephone call.  Please give ample time to the testing facility, and our office to run,  receive and review results. Please do not call inquiring of results, even if you can see them in your chart. We will contact you as soon as we are able. If it has been over 1 week since the test was completed, and you have not yet heard from [place], then please call us.    - echart message- for normal results that have been seen by the patient already.   - telephone call: abnormal results or if patient has not viewed results in their echart.  If a referral to a specialist was entered for you, please call us in 2 weeks if you have not heard from the specialist office to schedule.

## 2023-06-29 ENCOUNTER — Ambulatory Visit: Payer: Self-pay

## 2023-06-29 DIAGNOSIS — M818 Other osteoporosis without current pathological fracture: Secondary | ICD-10-CM | POA: Insufficient documentation

## 2023-06-29 DIAGNOSIS — E538 Deficiency of other specified B group vitamins: Secondary | ICD-10-CM | POA: Insufficient documentation

## 2023-06-29 NOTE — Progress Notes (Signed)
Ccm request

## 2023-06-30 ENCOUNTER — Telehealth: Payer: Self-pay | Admitting: Family Medicine

## 2023-06-30 DIAGNOSIS — N1832 Chronic kidney disease, stage 3b: Secondary | ICD-10-CM | POA: Insufficient documentation

## 2023-06-30 NOTE — Telephone Encounter (Signed)
Please call patient: Blood cell counts, liver function electrolytes and thyroid function are all normal. Vitamin D levels are normal. Kidney function is reduced mildly.  Had been reduced since as far back as 2020.  Encouraged her to make sure she is taking in adequate hydration and good control of blood pressure will help prevent further decrease in kidney function. B12 levels are low at 200.   -Would encourage her to start B12 1000 mcg sublingual if not already supplementing.  If she would desire B12 injections monthly by nurse visit, she is approved to schedule monthly injections.  Would still encourage daily sublingual to keep levels adequate between injections. Follow-up 5.5 months for CPE and [facility] chronic condition appointment combined.

## 2023-07-15 ENCOUNTER — Telehealth: Payer: Self-pay | Admitting: Hematology and Oncology

## 2023-07-15 NOTE — Telephone Encounter (Addendum)
Per Staff Message on 07/15/23; I called the patient and scheduled est visit appointment and left the appointment details on her voice mail. I also mailed a reminder notice.

## 2023-07-29 ENCOUNTER — Other Ambulatory Visit: Payer: Self-pay | Admitting: Hematology and Oncology

## 2023-07-29 DIAGNOSIS — Z170 Estrogen receptor positive status [ER+]: Secondary | ICD-10-CM

## 2023-08-23 ENCOUNTER — Inpatient Hospital Stay: Payer: BC Managed Care – PPO | Attending: Hematology and Oncology | Admitting: Hematology and Oncology

## 2023-08-23 VITALS — BP 132/83 | HR 80 | Temp 97.5°F | Resp 18 | Ht 65.0 in | Wt 180.6 lb

## 2023-08-23 DIAGNOSIS — Z170 Estrogen receptor positive status [ER+]: Secondary | ICD-10-CM

## 2023-08-23 DIAGNOSIS — R634 Abnormal weight loss: Secondary | ICD-10-CM | POA: Insufficient documentation

## 2023-08-23 DIAGNOSIS — Z79811 Long term (current) use of aromatase inhibitors: Secondary | ICD-10-CM | POA: Diagnosis not present

## 2023-08-23 DIAGNOSIS — C50512 Malignant neoplasm of lower-outer quadrant of left female breast: Secondary | ICD-10-CM | POA: Diagnosis not present

## 2023-08-23 NOTE — Progress Notes (Signed)
Patient Care Team: Natalia Leatherwood, DO as PCP - General (Family Medicine) Lonie Peak, MD as Attending Physician (Radiation Oncology) Harrington Challenger, NP (Inactive) as Nurse Practitioner (Obstetrics and Gynecology) Serena Croissant, MD as Consulting Physician (Hematology and Oncology) Bensimhon, Bevelyn Buckles, MD as Consulting Physician (Cardiology) Axel Filler, Larna Daughters, NP as Nurse Practitioner (Hematology and Oncology) Emelia Loron, MD as Consulting Physician (General Surgery) Serena Croissant, MD as Consulting Physician (Hematology and Oncology) Mercy Moore, MD as Referring Physician  DIAGNOSIS:  Encounter Diagnosis  Name Primary?   Malignant neoplasm of lower-outer quadrant of left breast of female, estrogen receptor positive (HCC) Yes    SUMMARY OF ONCOLOGIC HISTORY: Oncology History  Malignant neoplasm of lower-outer quadrant of left breast of female, estrogen receptor positive (HCC)  03/01/2017 Initial Diagnosis   Left breast 2:30 position: 2.1 cm irregular solid mass, additional 0.8 cm oval mass in the left breast (fibrocystic); primary mass on biopsy revealed IDC grade 3, ER 100%, PR 80%, Ki-67 25%, HER-2 positive ratio 1.88, copy #6.2, T2 N0 stage II a clinical stage   03/29/2017 - 06/14/2017 Neo-Adjuvant Chemotherapy   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  The Multi-Gene Panel offered by [facility] includes sequencing and/or deletion duplication testing of the following 80 genes: ALK, APC, ATM, AXIN2,BAP1,  BARD1, BLM, BMPR1A, BRCA1, BRCA2, BRIP1, CASR, CDC73, CDH1, CDK4, CDKN1B, CDKN1C, CDKN2A (p14ARF), CDKN2A (p16INK4a), CEBPA, CHEK2, CTNNA1, DICER1, DIS3L2, EGFR (c.2369C>T, p.Thr790Met variant only), EPCAM (Deletion/duplication testing only), FH, FLCN, GATA2, GPC3, GREM1 (Promoter region deletion/duplication testing only), HOXB13 (c.251G>A, p.Gly84Glu),  HRAS, KIT, MAX, MEN1, MET, MITF (c.952G>A, p.Glu318Lys variant only), MLH1, MSH2, MSH3, MSH6, MUTYH, NBN, NF1, NF2, NTHL1, PALB2, PDGFRA, PHOX2B, PMS2, POLD1, POLE, POT1, PRKAR1A, PTCH1, PTEN, RAD50, RAD51C, RAD51D, RB1, RECQL4, RET, RUNX1, SDHAF2, SDHA (sequence changes only), SDHB, SDHC, SDHD, SMAD4, SMARCA4, SMARCB1, SMARCE1, STK11, SUFU, TERT, TERT, TMEM127, TP53, TSC1, TSC2, VHL, WRN and WT1.  The report date is April 27, 2017.    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 Left breast, 2.67 Gy in 15 fractions for a total dose of 40.05 Gy  2. Boost, 2 Gy in 5 fractions for a total dose of 10 Gy      11/25/2017 -  Anti-estrogen oral therapy   Letrozole daily     CHIEF COMPLIANT: Follow-up on letrozole therapy  HISTORY OF PRESENT ILLNESS:  History of Present Illness   Alanda, a breast cancer survivor, presents for her annual follow-up. She has been on letrozole for hormone therapy for approximately six years following chemotherapy, surgery, and radiation. She reports no trouble taking the medication. Recently, she has lost some weight, which she attributes to healthier eating habits. 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 She also mentions that her daughter-in-law has been diagnosed with lung cancer, which has been a source of stress. She had a biopsy this summer, which was determined to be fatty necrosis.         ALLERGIES:  is allergic to no known allergies.  MEDICATIONS:  Current Outpatient Medications  Medication Sig Dispense Refill   albuterol (PROVENTIL HFA;VENTOLIN HFA) 108 (90 Base) MCG/ACT inhaler Inhale 2 puffs into the lungs  every 6 (six) hours as needed for wheezing or shortness of breath.     amLODipine (NORVASC) 10 MG tablet Take 1 tablet (10  mg total) by mouth daily. 90 tablet 1   buPROPion (WELLBUTRIN XL) 300 MG 24 hr tablet Take 1 tablet (300 mg total) by mouth daily. 90 tablet 1   FLUoxetine (PROZAC) 20 MG capsule Take 1 capsule (20 mg total) by mouth daily. 90 capsule 1   letrozole (FEMARA) 2.5 MG tablet TAKE 1 TABLET BY MOUTH EVERY DAY 90 tablet 0   losartan (COZAAR) 100 MG tablet Take 1 tablet (100 mg total) by mouth daily. 90 tablet 1   No current facility-administered medications for this visit.    PHYSICAL EXAMINATION: ECOG PERFORMANCE STATUS: 1 - Symptomatic but completely ambulatory  Vitals:   08/23/23 0948  BP: 132/83  Pulse: 80  Resp: 18  Temp: (!) 97.5 F (36.4 C)  SpO2: 99%   Filed Weights   08/23/23 0948  Weight: 180 lb 9.6 oz (81.9 kg)    Physical Exam   BREAST: Fatty necrosis present at biopsy site, no malignancy detected.      (exam performed in the presence of a chaperone)  LABORATORY DATA:  I have reviewed the data as listed    Latest Ref Rng & Units 06/28/2023    2:29 PM 07/30/2019    3:25 PM 10/06/2018   10:42 AM  CMP  Glucose 70 - 99 mg/dL 88  88  92   BUN 6 - 23 mg/dL 19  19  15    Creatinine 0.40 - 1.20 mg/dL 2.72  5.36  6.44   Sodium 135 - 145 mEq/L 139  142  142   Potassium 3.5 - 5.1 mEq/L 3.9  3.8  4.4   Chloride 96 - 112 mEq/L 101  107  106   CO2 19 - 32 mEq/L 30  23  29    Calcium 8.4 - 10.5 mg/dL 9.4  9.2  9.1   Total Protein 6.0 - 8.3 g/dL 6.5  6.8  6.4   Total Bilirubin 0.2 - 1.2 mg/dL 0.5  0.3  0.3   Alkaline Phos 39 - 117 U/L 72  85  77   AST 0 - 37 U/L 16  15  17    ALT 0 - 35 U/L 12  15  18      Lab Results  Component Value Date   WBC 5.2 06/28/2023   HGB 13.8 06/28/2023   HCT 42.7 06/28/2023   MCV 91.6 06/28/2023   PLT 265.0 06/28/2023   NEUTROABS 3.2 07/30/2019    ASSESSMENT & PLAN:  Malignant neoplasm of lower-outer quadrant of left breast of female, estrogen receptor positive (HCC) Left breast 2:30 position: 2.1 cm irregular solid mass, additional 0.8  cm oval mass in the left breast (fibrocystic); primary mass on biopsy revealed IDC grade 3, ER 100%, PR 80%, Ki-67 25%, HER-2 positive ratio 1.88, copy #6.2, T2 N0 stage II a clinical stage   Treatment summary: 1. Neoadjuvant Taxol Herceptin weekly 11 completed 06/14/2017 followed by Herceptin maintenance x 6 months completed 02/2018 2. breast conserving surgery with sentinel lymph node biopsy done on 07/21/2017 07/21/2017 Left lumpectomy: Grade 2 IDC 1.9 cm with DCIS, margins negative, 0/4 lymph nodes negative, ER 100%, PR 80%, HER-2 positive, Ki-67 25%, RCB class II,T1c N0 stage IA 3. Adjuvant radiation therapy finished on 10/03/17 4. 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  Breast exam 08/23/2023: Benign 2.  Mammogram 04/29/2023: [facility] benign breast density category B    Weight Loss Patient reports intentional weight loss through healthy eating. -Encourage continuation of healthy lifestyle habits.  General Health Maintenance Patient is under significant stress due to family health issues. -Encourage self-care and stress management strategies.       Return to clinic in 1 year for follow-up     No orders of the defined types were placed in this encounter.  The patient has a good understanding of the overall plan. she agrees with it. she will call with any problems that may develop before the next visit here. Total time spent: 30 mins including face to face time and time spent for planning, charting and co-ordination of care   Tamsen Meek,  MD 08/23/23

## 2023-08-23 NOTE — Assessment & Plan Note (Signed)
Left breast 2:30 position: 2.1 cm irregular solid mass, additional 0.8 cm oval mass in the left breast (fibrocystic); primary mass on biopsy revealed IDC grade 3, ER 100%, PR 80%, Ki-67 25%, HER-2 positive ratio 1.88, copy #6.2, T2 N0 stage II a clinical stage   Treatment summary: 1. Neoadjuvant Taxol Herceptin weekly 11 completed 06/14/2017 followed by Herceptin maintenance x 6 months completed 02/2018 2. breast conserving surgery with sentinel lymph node biopsy done on 07/21/2017 07/21/2017 Left lumpectomy: Grade 2 IDC 1.9 cm with DCIS, margins negative, 0/4 lymph nodes negative, ER 100%, PR 80%, HER-2 positive, Ki-67 25%, RCB class II,T1c N0 stage IA 3. Adjuvant radiation therapy finished on 10/03/17 4. Letrozole started 11/25/17 (bone density 01/06/2018 was normal) -------------------------------------------------------------------------------------------------------------------------------------------------------------------------------------------------------------------  Letrozole toxicities: Patient states hot all the time Bone density 01/06/2018: T score -0.7: Normal Anxiety issues: Her mother is 16 years old and she is a caregiver.     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  Breast exam 08/23/2023: Benign 2.  Mammogram 04/29/2023: [facility] benign breast density category B   Return to clinic in 1 year for follow-up

## 2023-09-12 ENCOUNTER — Ambulatory Visit: Payer: BC Managed Care – PPO | Admitting: Hematology and Oncology

## 2023-10-10 ENCOUNTER — Encounter: Payer: Self-pay | Admitting: Oncology

## 2023-10-28 ENCOUNTER — Other Ambulatory Visit: Payer: Self-pay | Admitting: Hematology and Oncology

## 2023-10-28 DIAGNOSIS — Z170 Estrogen receptor positive status [ER+]: Secondary | ICD-10-CM

## 2024-02-26 ENCOUNTER — Other Ambulatory Visit: Payer: Self-pay | Admitting: Family Medicine

## 2024-04-10 ENCOUNTER — Other Ambulatory Visit: Payer: Self-pay

## 2024-04-18 ENCOUNTER — Telehealth: Payer: Self-pay | Admitting: Family Medicine

## 2024-04-18 NOTE — Telephone Encounter (Unsigned)
 Copied from CRM [id number]. Topic: Clinical - Medication Refill >> Apr 18, 2024  2:13 PM Aisha D wrote: Medication: amLODipine  (NORVASC ) 10 MG tablet, buPROPion  (WELLBUTRIN  XL) 300 MG 24 hr tablet,FLUoxetine  (PROZAC ) 20 MG capsule, losartan  (COZAAR ) 100 MG tablet  Has the patient contacted their pharmacy? Yes (Agent: If no, request that the patient contact the pharmacy for the refill. If patient does not wish to contact the pharmacy document the reason why and proceed with request.) (Agent: If yes, when and what did the pharmacy advise?)  This is the patient's preferred pharmacy:  [id number] - [place], [place] - [address] [place] [address] Phone: [phone] Fax: [phone]  Is this the correct pharmacy for this prescription? Yes If no, delete pharmacy and type the correct one.   Has the prescription been filled recently? No  Is the patient out of the medication? No  Has the patient been seen for an appointment in the last year OR does the patient have an upcoming appointment? Yes  Can we respond through MyChart? Yes  Agent: Please be advised that Rx refills may take up to 3 business days. We ask that you follow-up with your pharmacy.

## 2024-04-25 ENCOUNTER — Ambulatory Visit: Payer: Self-pay | Admitting: Family Medicine

## 2024-04-25 ENCOUNTER — Encounter: Payer: Self-pay | Admitting: Family Medicine

## 2024-04-25 ENCOUNTER — Ambulatory Visit (INDEPENDENT_AMBULATORY_CARE_PROVIDER_SITE_OTHER): Payer: Self-pay | Admitting: Family Medicine

## 2024-04-25 VITALS — BP 130/80 | HR 76 | Temp 98.1°F | Ht 65.5 in | Wt 168.0 lb

## 2024-04-25 DIAGNOSIS — C50512 Malignant neoplasm of lower-outer quadrant of left female breast: Secondary | ICD-10-CM

## 2024-04-25 DIAGNOSIS — E538 Deficiency of other specified B group vitamins: Secondary | ICD-10-CM

## 2024-04-25 DIAGNOSIS — Z0000 Encounter for general adult medical examination without abnormal findings: Secondary | ICD-10-CM

## 2024-04-25 DIAGNOSIS — N1832 Chronic kidney disease, stage 3b: Secondary | ICD-10-CM | POA: Diagnosis not present

## 2024-04-25 DIAGNOSIS — M818 Other osteoporosis without current pathological fracture: Secondary | ICD-10-CM | POA: Diagnosis not present

## 2024-04-25 DIAGNOSIS — Z803 Family history of malignant neoplasm of breast: Secondary | ICD-10-CM

## 2024-04-25 DIAGNOSIS — Z13220 Encounter for screening for lipoid disorders: Secondary | ICD-10-CM

## 2024-04-25 DIAGNOSIS — Z119 Encounter for screening for infectious and parasitic diseases, unspecified: Secondary | ICD-10-CM

## 2024-04-25 DIAGNOSIS — F339 Major depressive disorder, recurrent, unspecified: Secondary | ICD-10-CM

## 2024-04-25 DIAGNOSIS — Z131 Encounter for screening for diabetes mellitus: Secondary | ICD-10-CM | POA: Diagnosis not present

## 2024-04-25 DIAGNOSIS — Z170 Estrogen receptor positive status [ER+]: Secondary | ICD-10-CM

## 2024-04-25 DIAGNOSIS — I10 Essential (primary) hypertension: Secondary | ICD-10-CM

## 2024-04-25 DIAGNOSIS — Z1231 Encounter for screening mammogram for malignant neoplasm of breast: Secondary | ICD-10-CM

## 2024-04-25 DIAGNOSIS — Z1211 Encounter for screening for malignant neoplasm of colon: Secondary | ICD-10-CM

## 2024-04-25 DIAGNOSIS — Z2821 Immunization not carried out because of patient refusal: Secondary | ICD-10-CM | POA: Insufficient documentation

## 2024-04-25 LAB — CBC
HCT: 38.6 % (ref 36.0–46.0)
Hemoglobin: 13 g/dL (ref 12.0–15.0)
MCHC: 33.6 g/dL (ref 30.0–36.0)
MCV: 90.6 fl (ref 78.0–100.0)
Platelets: 222 K/uL (ref 150.0–400.0)
RBC: 4.26 Mil/uL (ref 3.87–5.11)
RDW: 13.3 % (ref 11.5–15.5)
WBC: 3.4 K/uL — ABNORMAL LOW (ref 4.0–10.5)

## 2024-04-25 LAB — VITAMIN D 25 HYDROXY (VIT D DEFICIENCY, FRACTURES): VITD: 42.06 ng/mL (ref 30.00–100.00)

## 2024-04-25 LAB — COMPREHENSIVE METABOLIC PANEL WITH GFR
ALT: 13 U/L (ref 0–35)
AST: 15 U/L (ref 0–37)
Albumin: 4 g/dL (ref 3.5–5.2)
Alkaline Phosphatase: 66 U/L (ref 39–117)
BUN: 18 mg/dL (ref 6–23)
CO2: 31 meq/L (ref 19–32)
Calcium: 8.8 mg/dL (ref 8.4–10.5)
Chloride: 107 meq/L (ref 96–112)
Creatinine, Ser: 0.89 mg/dL (ref 0.40–1.20)
GFR: 69.32 mL/min (ref >60.00)
Glucose, Bld: 96 mg/dL (ref 70–99)
Potassium: 3.5 meq/L (ref 3.5–5.1)
Sodium: 144 meq/L (ref 135–145)
Total Bilirubin: 0.4 mg/dL (ref 0.2–1.2)
Total Protein: 5.9 g/dL — ABNORMAL LOW (ref 6.0–8.3)

## 2024-04-25 LAB — HEMOGLOBIN A1C: Hgb A1c MFr Bld: 5.6 % (ref 4.6–6.5)

## 2024-04-25 LAB — TSH: TSH: 2.78 u[IU]/mL (ref 0.35–5.50)

## 2024-04-25 LAB — LIPID PANEL
Cholesterol: 202 mg/dL — ABNORMAL HIGH (ref 0–200)
HDL: 65.5 mg/dL
LDL Cholesterol: 119 mg/dL — ABNORMAL HIGH (ref 0–99)
NonHDL: 136.01
Total CHOL/HDL Ratio: 3
Triglycerides: 85 mg/dL (ref 0.0–149.0)
VLDL: 17 mg/dL (ref 0.0–40.0)

## 2024-04-25 LAB — B12 AND FOLATE PANEL
Folate: 10.1 ng/mL (ref >5.9)
Vitamin B-12: 716 pg/mL (ref 211–911)

## 2024-04-25 MED ORDER — FLUOXETINE HCL 20 MG PO CAPS: 20.0000 mg | ORAL_CAPSULE | Freq: Every day | ORAL | 1 refills | Status: AC

## 2024-04-25 MED ORDER — LOSARTAN POTASSIUM 100 MG PO TABS: 100.0000 mg | ORAL_TABLET | Freq: Every day | ORAL | 1 refills | Status: AC

## 2024-04-25 MED ORDER — AMLODIPINE BESYLATE 10 MG PO TABS: 10.0000 mg | ORAL_TABLET | Freq: Every day | ORAL | 1 refills | Status: AC

## 2024-04-25 MED ORDER — BUPROPION HCL ER (XL) 300 MG PO TB24: 300.0000 mg | ORAL_TABLET | Freq: Every day | ORAL | 1 refills | Status: AC

## 2024-04-25 NOTE — Patient Instructions (Addendum)
 Return in about 6 months (around 10/15/2024) for Routine chronic condition follow-up.        Great to see you today.  I have refilled the medication(s) we provide.   If labs were collected or images ordered, we will inform you of  results once we have received them and reviewed. We will contact you either by echart message, or telephone call.  Please give ample time to the testing facility, and our office to run,  receive and review results. Please do not call inquiring of results, even if you can see them in your chart. We will contact you as soon as we are able. If it has been over 1 week since the test was completed, and you have not yet heard from us , then please call us .    - echart message- for normal results that have been seen by the patient already.   - telephone call: abnormal results or if patient has not viewed results in their echart.  If a referral to a specialist was entered for you, please call us  in 2 weeks if you have not heard from the specialist office to schedule.

## 2024-04-25 NOTE — Progress Notes (Signed)
 Patient ID: Haley Sanchez, female  DOB: 17-May-1961, 63 y.o.   MRN: [id number] Patient Care Team    Relationship Specialty Notifications Start End  Catherine Charlies LABOR, DO PCP - General Family Medicine  06/28/23   Izell Domino, MD Attending Physician Radiation Oncology  03/08/17   Neysa Inocente PARAS, NP (Inactive) Nurse Practitioner Obstetrics and Gynecology  03/09/17   Odean Potts, MD Consulting Physician Hematology and Oncology  11/22/18   Bensimhon, Toribio SAUNDERS, MD Consulting Physician Cardiology  11/22/18   Crawford Morna Pickle, NP Nurse Practitioner Hematology and Oncology  11/22/18   Ebbie Cough, MD Consulting Physician General Surgery  11/24/18   Odean Potts, MD Consulting Physician Hematology and Oncology  06/27/23   Chet Rush, MD Referring Physician   06/29/23     Chief Complaint  Patient presents with   Annual Exam    Pt is fasting.     Subjective:  Haley Sanchez is a 63 y.o.  female present for new patient establishment. All past medical history, surgical history, allergies, family history, immunizations, medications and social history were updated in the electronic medical record today. All recent labs, ED visits and hospitalizations within the last year were reviewed.  Health maintenance:  Colon cancer screen: No family history. Cologuard completed 09/16/2022. Rpt 3 yrs Mammogram: Family history and personal history of breast cancer, on Femara .completed by onc at [facility] on [address]- records requested Cervical cancer screening:referred to gyn Immunizations: Tdap- declined, influenza encouraged yearly, Prevnar 20-declined, Shingrix series-declined Infectious disease screening: HIV-declined and hepatitis C- declined  Primary hypertension Pt reports compliance with amlodipine  10 mg daily and losartan  100 mg daily. Patient denies chest pain, shortness of breath, dizziness or lower extremity edema.    Recurrent major depressive disorder, remission status unspecified  (HCC) Patient reports compliance with use of Wellbutrin  300 mg XL and Prozac  20 mg daily.   She feels this regimen is working well for her.   Prior meds: Paxil     04/25/2024    8:08 AM 06/28/2023    2:12 PM 11/09/2017   11:29 AM 08/05/2017    8:39 AM  Depression screen PHQ 2/9  Decreased Interest 1 1 0 0  Down, Depressed, Hopeless 1 2 0 0  PHQ - 2 Score 2 3 0 0  Altered sleeping 3 3    Tired, decreased energy 3     Change in appetite 0 2    Feeling bad or failure about yourself  1 0    Trouble concentrating 0 3    Moving slowly or fidgety/restless 1 0    Suicidal thoughts 0     PHQ-9 Score 10 11    Difficult doing work/chores Not difficult at all Somewhat difficult        04/25/2024    8:08 AM 06/28/2023    2:12 PM  GAD 7 : Generalized Anxiety Score  Nervous, Anxious, on Edge 2 3  Control/stop worrying 2 3  Worry too much - different things 3 3  Trouble relaxing 1 3  Restless 0 1  Easily annoyed or irritable 1 1  Afraid - awful might happen 0 1  Total GAD 7 Score 9 15  Anxiety Difficulty  Somewhat difficult          04/25/2024    8:07 AM 06/28/2023    2:11 PM 11/09/2017   11:29 AM 08/05/2017    8:39 AM  Fall Risk   Falls in the past year? 0 0 No  No   Number falls in past yr:  0    Injury with Fall?  0    Follow up Falls evaluation completed        Data saved with a previous flowsheet row definition    Immunization History  Administered Date(s) Administered   Influenza, Seasonal, Injecte, Preservative Fre 06/28/2023   Influenza,inj,Quad PF,6+ Mos 10/28/2017, 06/15/2019, 09/08/2022   PFIZER(Purple Top)SARS-COV-2 Vaccination 12/07/2019, 12/29/2019, 07/28/2020   Pfizer Covid-19 Vaccine Bivalent Booster 71yrs & up 09/02/2021   Pfizer(Comirnaty)Fall Seasonal Vaccine 12 years and older 07/28/2023    No results found.  Past Medical History:  Diagnosis Date   Anxiety    Asthma    Breast cancer (HCC)    Complication of anesthesia    Depression    Family  history of breast cancer    Family history of prostate cancer    History of radiation therapy 08/29/2017- 10/03/17   Left Breast 40.05 Gy in 15 fractions followed by a boost of 10 Gy per fraction.    Hypertension    PONV (postoperative nausea and vomiting)    Port catheter REMOVED 04/05/2017   Seasonal allergies    Allergies  Allergen Reactions   No Known Allergies    Past Surgical History:  Procedure Laterality Date   BREAST LUMPECTOMY WITH RADIOACTIVE SEED AND SENTINEL LYMPH NODE BIOPSY Left 07/21/2017   Procedure: BREAST LUMPECTOMY WITH RADIOACTIVE SEED AND SENTINEL LYMPH NODE BIOPSY;  Surgeon: Ebbie Cough, MD;  Location:  SUR[facility]ERY CENTER;  Service: General;  Laterality: Left;   PORTACATH PLACEMENT Right 03/28/2017   Procedure: INSERTION PORT-A-CATH WITH US ;  Surgeon: Ebbie Cough, MD;  Location: [facility];  Service: General;  Laterality: Right;   Family History  Problem Relation Age of Onset   Heart disease Mother    Hypertension Mother    Kidney failure Father    Diabetes Father    Hypertension Father    Prostate cancer Father 48   Brain cancer Maternal Grandmother 54   Prostate cancer Maternal Grandfather        dx in his 5s   Breast cancer Paternal Grandmother    Alcohol abuse Maternal Aunt    Other Maternal Uncle        died in MVA   Breast cancer Cousin        maternal first cousin dx in her 88s   Mental retardation Cousin    Breast cancer Other        maternal second cousin   Breast cancer Cousin        paternal first cousin   Breast cancer Cousin        paternal first cousin   Social History   Social History Narrative   Marital status/children/pets: Married.   Education/employment: Masters degree.  Works as a Engineer, agricultural:      -smoke alarm in the home:Yes     - wears seatbelt: Yes     - Feels safe in their relationships: No       Allergies as of 04/25/2024       Reactions   No Known Allergies         Medication List         Accurate as of April 25, 2024  8:30 AM. If you have any questions, ask your nurse or doctor.          albuterol  108 (90 Base) MCG/ACT inhaler Commonly known as: VENTOLIN  HFA Inhale 2 puffs into the lungs every  6 (six) hours as needed for wheezing or shortness of breath.   amLODipine  10 MG tablet Commonly known as: NORVASC  Take 1 tablet (10 mg total) by mouth daily.   buPROPion  300 MG 24 hr tablet Commonly known as: WELLBUTRIN  XL Take 1 tablet (300 mg total) by mouth daily.   FLUoxetine  20 MG capsule Commonly known as: PROZAC  Take 1 capsule (20 mg total) by mouth daily. What changed: how much to take Changed by: Kandie Keiper   letrozole  2.5 MG tablet Commonly known as: FEMARA  TAKE 1 TABLET BY MOUTH EVERY DAY   losartan  100 MG tablet Commonly known as: COZAAR  Take 1 tablet (100 mg total) by mouth daily.        All past medical history, surgical history, allergies, family history, immunizations andmedications were updated in the EMR today and reviewed under the history and medication portions of their EMR.    No results found for this or any previous visit (from the past 2160 hours).     ROS 14 pt review of systems performed and negative (unless mentioned in an HPI)  Objective: BP 130/80   Pulse 76   Temp 98.1 F (36.7 C)   Ht 5' 5.5 (1.664 m)   Wt 168 lb (76.2 kg)   SpO2 97%   BMI 27.53 kg/m  Physical Exam Vitals and nursing note reviewed.  Constitutional:      General: She is not in acute distress.    Appearance: Normal appearance. She is not ill-appearing or toxic-appearing.  HENT:     Head: Normocephalic and atraumatic.     Right Ear: Tympanic membrane, ear canal and external ear normal. There is no impacted cerumen.     Left Ear: Tympanic membrane, ear canal and external ear normal. There is no impacted cerumen.     Nose: No congestion or rhinorrhea.     Mouth/Throat:     Mouth: Mucous membranes are moist.     Pharynx: Oropharynx is clear.  No oropharyngeal exudate or posterior oropharyngeal erythema.  Eyes:     General: No scleral icterus.       Right eye: No discharge.        Left eye: No discharge.     Extraocular Movements: Extraocular movements intact.     Conjunctiva/sclera: Conjunctivae normal.     Pupils: Pupils are equal, round, and reactive to light.  Cardiovascular:     Rate and Rhythm: Normal rate and regular rhythm.     Pulses: Normal pulses.     Heart sounds: Normal heart sounds. No murmur heard.    No friction rub. No gallop.  Pulmonary:     Effort: Pulmonary effort is normal. No respiratory distress.     Breath sounds: Normal breath sounds. No stridor. No wheezing, rhonchi or rales.  Chest:     Chest wall: No tenderness.  Abdominal:     General: Abdomen is flat. Bowel sounds are normal. There is no distension.     Palpations: Abdomen is soft. There is no mass.     Tenderness: There is no abdominal tenderness. There is no right CVA tenderness, left CVA tenderness, guarding or rebound.     Hernia: No hernia is present.  Musculoskeletal:        General: No swelling, tenderness or deformity. Normal range of motion.     Cervical back: Normal range of motion and neck supple. No rigidity or tenderness.     Right lower leg: No edema.     Left lower leg:  No edema.  Lymphadenopathy:     Cervical: No cervical adenopathy.  Skin:    General: Skin is warm and dry.     Coloration: Skin is not jaundiced or pale.     Findings: No bruising, erythema, lesion or rash.  Neurological:     General: No focal deficit present.     Mental Status: She is alert and oriented to person, place, and time. Mental status is at baseline.     Cranial Nerves: No cranial nerve deficit.     Sensory: No sensory deficit.     Motor: No weakness.     Coordination: Coordination normal.     Gait: Gait normal.     Deep Tendon Reflexes: Reflexes normal.  Psychiatric:        Mood and Affect: Mood normal.        Behavior: Behavior normal.         Thought Content: Thought content normal.        Judgment: Judgment normal.       Assessment/plan: Purvi H Lavis is a 63 y.o. female present for annual physical combined chronic condition management appointment Routine general medical examination at a health care facility (Primary) - CBC - Comprehensive metabolic panel with GFR - TSH Patient was encouraged to exercise greater than 150 minutes a week. Patient was encouraged to choose a diet filled with fresh fruits and vegetables, and lean meats. AVS provided to patient today for education/recommendation on gender specific health and safety maintenance. Colon cancer screen: No family history. Cologuard completed 09/16/2022. Rpt 3 yrs Mammogram: Family history and personal history of breast cancer, on Femara .completed by onc at [facility] on [address]- records requested Cervical cancer screening:referred to gyn Immunizations: Tdap- declined, influenza encouraged yearly, Prevnar 20-declined, Shingrix series-declined Infectious disease screening: HIV-declined and hepatitis C- declined  Malignant neoplasm of lower-outer quadrant of left breast of female, estrogen receptor positive (HCC)/ Family history of breast cancer - Ambulatory referral to Gynecology  CKD stage 3b, GFR 30-44 ml/min (HCC) - Comprehensive metabolic panel with GFR - Vitamin D  (25 hydroxy)  B12 deficiency - B12 and Folate Panel - continue supp- taking 1000 mcg subL Diabetes mellitus screening - Hemoglobin A1c Lipid screening - Lipid panel osteoporosis without current pathological fracture - DG Bone Density; Future - Vitamin D  (25 hydroxy)  Immunization declined- PNA, shingrix, tdap Encounter for screening examination for infectious disease-declined hiv and hep c screenings  Primary hypertension Stable Continue amlodipine  10 mg daily Continue losartan  100 mg daily  Recurrent major depressive disorder, remission status unspecified (HCC) Stable Continue  fluoxetine  20 mg daily Continue Wellbutrin  300 mg daily   Return in about 6 months (around 10/15/2024) for Routine chronic condition follow-up.  Orders Placed This Encounter  Procedures   DG Bone Density   CBC   Comprehensive metabolic panel with GFR   Hemoglobin A1c   Lipid panel   TSH   B12 and Folate Panel   Vitamin D  (25 hydroxy)   Cologuard   Ambulatory referral to Gynecology   Meds ordered this encounter  Medications   amLODipine  (NORVASC ) 10 MG tablet    Sig: Take 1 tablet (10 mg total) by mouth daily.    Dispense:  90 tablet    Refill:  1    Needs office visit for any additional refills   buPROPion  (WELLBUTRIN  XL) 300 MG 24 hr tablet    Sig: Take 1 tablet (300 mg total) by mouth daily.    Dispense:  90 tablet  Refill:  1    OV NEEDED FOR FURTHER REFILLS.   FLUoxetine  (PROZAC ) 20 MG capsule    Sig: Take 1 capsule (20 mg total) by mouth daily.    Dispense:  90 capsule    Refill:  1    OV NEEDED FOR FURTHER REFILLS.   losartan  (COZAAR ) 100 MG tablet    Sig: Take 1 tablet (100 mg total) by mouth daily.    Dispense:  90 tablet    Refill:  1    OV NEEDED FOR FURTHER REFILLS.   Referral Orders         Ambulatory referral to Gynecology       Note is dictated utilizing voice recognition software. Although note has been proof read prior to signing, occasional typographical errors still can be missed. If any questions arise, please do not hesitate to call for verification.  Electronically signed by: Charlies Bellini, DO  [facility]

## 2024-04-26 ENCOUNTER — Encounter: Payer: Self-pay | Admitting: Oncology

## 2024-05-18 LAB — HM MAMMOGRAPHY

## 2024-05-23 ENCOUNTER — Encounter: Payer: Self-pay | Admitting: Hematology and Oncology

## 2024-07-09 ENCOUNTER — Ambulatory Visit: Admitting: Family Medicine

## 2024-07-09 ENCOUNTER — Encounter: Payer: Self-pay | Admitting: Family Medicine

## 2024-07-09 VITALS — BP 132/79 | HR 77 | Temp 98.6°F | Ht 65.5 in | Wt 169.4 lb

## 2024-07-09 DIAGNOSIS — Z23 Encounter for immunization: Secondary | ICD-10-CM | POA: Diagnosis not present

## 2024-07-09 DIAGNOSIS — M533 Sacrococcygeal disorders, not elsewhere classified: Secondary | ICD-10-CM | POA: Diagnosis not present

## 2024-07-09 DIAGNOSIS — M545 Low back pain, unspecified: Secondary | ICD-10-CM | POA: Diagnosis not present

## 2024-07-09 DIAGNOSIS — M9904 Segmental and somatic dysfunction of sacral region: Secondary | ICD-10-CM | POA: Diagnosis not present

## 2024-07-09 LAB — POC URINALSYSI DIPSTICK (AUTOMATED)
Bilirubin, UA: NEGATIVE
Blood, UA: NEGATIVE
Glucose, UA: NEGATIVE
Ketones, UA: NEGATIVE
Leukocytes, UA: NEGATIVE
Nitrite, UA: NEGATIVE
Protein, UA: POSITIVE — AB
Spec Grav, UA: >=1.03 — AB (ref 1.010–1.025)
Urobilinogen, UA: 0.2 U/dL
pH, UA: 6 (ref 5.0–8.0)

## 2024-07-09 MED ORDER — METHYLPREDNISOLONE ACETATE 80 MG/ML IJ SUSP
80.0000 mg | Freq: Once | INTRAMUSCULAR | Status: AC
  Administered 2024-07-09: 80 mg via INTRAMUSCULAR

## 2024-07-09 MED ORDER — CYCLOBENZAPRINE HCL 10 MG PO TABS: 5.0000 mg | ORAL_TABLET | Freq: Every day | ORAL | 0 refills | Status: AC

## 2024-07-09 MED ORDER — PREDNISONE 20 MG PO TABS: ORAL_TABLET | ORAL | 0 refills | Status: DC

## 2024-07-09 NOTE — Progress Notes (Signed)
 Haley Sanchez, Haley Sanchez 17-Apr-1961, 63 y.o., female MRN: [id number] Patient Care Team    Relationship Specialty Notifications Start End  Catherine Charlies LABOR, DO PCP - General Family Medicine  06/28/23   Izell Domino, MD Attending Physician Radiation Oncology  03/08/17   Neysa Inocente PARAS, NP (Inactive) Nurse Practitioner Obstetrics and Gynecology  03/09/17   Odean Potts, MD Consulting Physician Hematology and Oncology  11/22/18   Bensimhon, Toribio SAUNDERS, MD Consulting Physician Cardiology  11/22/18   Crawford Morna Pickle, NP Nurse Practitioner Hematology and Oncology  11/22/18   Ebbie Cough, MD Consulting Physician General Surgery  11/24/18   Odean Potts, MD Consulting Physician Hematology and Oncology  06/27/23   Chet Rush, MD Referring Physician   06/29/23     Chief Complaint  Patient presents with   Back Pain    Patient states having lower back pain, she thinks it maybe kidney related and wants a urine checked. She has only taken naproxen for the pain some times more ice and heat and stretching. Hurts most to sit     Subjective: Haley Sanchez is a 63 y.o. Pt presents for an OV with complaints of low back pain of > 5 weeks duration.  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  She needs to the lower lumbar spine location she believes the pain originates.  She denies radiation of pain to her extremities. Pt feels symptoms may be kidney related and desires urine check today.  She denies urinary frequency, fevers or chills.  Pt has tried ibuprofen to ease their symptoms.  Patient does not have a history of lumbar arthritis, lumbar injury or surgery of the lumbar back.     07/09/2024    2:30 PM 04/25/2024    8:08 AM 06/28/2023    2:12 PM 11/09/2017   11:29 AM 08/05/2017    8:39 AM  Depression screen PHQ 2/9  Decreased Interest 1 1 1  0 0  Down, Depressed, Hopeless 1 1 2  0 0  PHQ - 2 Score 2 2 3  0 0  Altered  sleeping 3 3 3     Tired, decreased energy 3 3     Change in appetite 0 0 2    Feeling bad or failure about yourself  1 1 0    Trouble concentrating 0 0 3    Moving slowly or fidgety/restless 0 1 0    Suicidal thoughts 0 0     PHQ-9 Score 9 10 11     Difficult doing work/chores Not difficult at all Not difficult at all Somewhat difficult      Allergies  Allergen Reactions   No Known Allergies    Social History   Social History Narrative   Marital status/children/pets: Married.   Education/employment: Masters degree.  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    Hypertension    PONV (postoperative nausea and vomiting)    Port catheter REMOVED 04/05/2017   Seasonal allergies    Past Surgical History:  Procedure Laterality Date   BREAST LUMPECTOMY WITH RADIOACTIVE SEED AND SENTINEL LYMPH NODE BIOPSY Left 07/21/2017   Procedure: BREAST LUMPECTOMY WITH RADIOACTIVE SEED AND SENTINEL LYMPH NODE BIOPSY;  Surgeon: Ebbie Cough, MD;  Location: [facility];  Service: General;  Laterality: Left;   PORTACATH PLACEMENT Right 03/28/2017   Procedure: INSERTION PORT-A-CATH WITH US ;  Surgeon: Ebbie Cough, MD;  Location: [facility] OR;  Service: General;  Laterality: Right;   Family History  Problem Relation Age of Onset   Heart disease Mother    Hypertension Mother    Kidney failure Father    Diabetes Father    Hypertension Father    Prostate cancer Father 42   Brain cancer Maternal Grandmother 21   Prostate cancer Maternal Grandfather         dx in his 12s   Breast cancer Paternal Grandmother    Alcohol abuse Maternal Aunt    Other Maternal Uncle        died in MVA   Breast cancer Cousin        maternal first cousin dx in her 62s   Mental retardation Cousin    Breast cancer Other        maternal second cousin   Breast cancer Cousin        paternal first cousin   Breast cancer Cousin        paternal first cousin   Allergies as of 07/09/2024       Reactions   No Known Allergies         Medication List        Accurate as of July 09, 2024  3:07 PM. If you have any questions, ask your nurse or doctor.          albuterol  108 (90 Base) MCG/ACT inhaler Commonly known as: VENTOLIN  HFA Inhale 2 puffs into the lungs every 6 (six) hours as needed for wheezing or shortness of breath.   amLODipine  10 MG tablet Commonly known as: NORVASC  Take 1 tablet (10 mg total) by mouth daily.   buPROPion  300 MG 24 hr tablet Commonly known as: WELLBUTRIN  XL Take 1 tablet (300 mg total) by mouth daily.   cyclobenzaprine 10 MG tablet Commonly known as: FLEXERIL Take 0.5-1 tablets (5-10 mg total) by mouth at bedtime. Started by: Charlies Bellini   FLUoxetine  20 MG capsule Commonly known as: PROZAC  Take 1 capsule (20 mg total) by mouth daily.   letrozole  2.5 MG tablet Commonly known as: FEMARA  TAKE 1 TABLET BY MOUTH EVERY DAY   losartan  100 MG tablet Commonly known as: COZAAR  Take 1 tablet (100 mg total) by mouth daily.   predniSONE 20 MG tablet Commonly known as: DELTASONE 60 mg x1d, 40 mg x3d, 20 mg x2d, 10 mg x2d Start taking on: July 10, 2024 Started by: Charlies Bellini        All past medical history, surgical history, allergies, family history, immunizations andmedications were updated in the EMR today and reviewed under the history and medication portions of their EMR.     ROS Negative, with the exception of above mentioned in HPI   Objective:  BP 132/79   Pulse 77   Temp 98.6 F (37 C) (Oral)    Ht 5' 5.5 (1.664 m)   Wt 169 lb 6.4  oz (76.8 kg)   SpO2 96%   BMI 27.76 kg/m  Body mass index is 27.76 kg/m.  Physical Exam Vitals and nursing note reviewed.  Constitutional:      General: She is not in acute distress.    Appearance: Normal appearance. She is normal weight. She is not ill-appearing or toxic-appearing.  HENT:     Head: Normocephalic and atraumatic.  Eyes:     General: No scleral icterus.       Right eye: No discharge.        Left eye: No discharge.     Extraocular Movements: Extraocular movements intact.     Conjunctiva/sclera: Conjunctivae normal.     Pupils: Pupils are equal, round, and reactive to light.  Musculoskeletal:     Lumbar back: Spasms and tenderness present. No swelling or bony tenderness. Decreased range of motion. Negative right straight leg raise test and negative left straight leg raise test.       Back:     Comments: Left SI pain with range of motion right sidebending, left sidebending and flexion Neurovascularly intact distally  Skin:    Findings: No rash.  Neurological:     Mental Status: She is alert and oriented to person, place, and time. Mental status is at baseline.     Motor: No weakness.     Coordination: Coordination normal.     Gait: Gait normal.  Psychiatric:        Mood and Affect: Mood normal.        Behavior: Behavior normal.        Thought Content: Thought content normal.        Judgment: Judgment normal.     No results found. No results found. Results for orders placed or performed in visit on 07/09/24 (from the past 24 hours)  POCT Urinalysis Dipstick (Automated)     Status: Abnormal   Collection Time: 07/09/24  2:42 PM  Result Value Ref Range   Color, UA yellow    Clarity, UA clear    Glucose, UA Negative Negative   Bilirubin, UA negative    Ketones, UA negative    Spec Grav, UA >=1.030 (A) 1.010 - 1.025   Blood, UA negative    pH, UA 6.0 5.0 - 8.0   Protein, UA Positive (A) Negative   Urobilinogen, UA  0.2 0.2 or 1.0 E.U./dL   Nitrite, UA negative    Leukocytes, UA Negative Negative    Assessment/Plan: Haley Sanchez is a 63 y.o. female present for OV for  Lumbar pain (Primary)/Sacroiliac pain/Somatic dysfunction of left sacroiliac joint Left SI pain after rearranging furniture 5 weeks ago  - IM depo medrol 80> start prednisone shorter course tomorrow.  - flexeril 5-10 mg at bedtime - stretches and PT referred - POCT Urinalysis Dipstick (Automated)> negative - Ambulatory referral to Physical Therapy  Influenza vaccine completed today  Return in about 4 weeks (around 08/06/2024), or if symptoms worsen or fail to improve.   Reviewed expectations re: course of current medical issues. Discussed self-management of symptoms. Outlined signs and symptoms indicating need for more acute intervention. Patient verbalized understanding and all questions were answered. Patient received an After-Visit Summary.    Orders Placed This Encounter  Procedures   Flu vaccine trivalent PF, 6mos and older(Flulaval,Afluria,Fluarix,Fluzone)   Ambulatory referral to Physical Therapy   POCT Urinalysis Dipstick (Automated)   Meds ordered this encounter  Medications   predniSONE (DELTASONE) 20 MG tablet    Sig: 60 mg x1d,  40 mg x3d, 20 mg x2d, 10 mg x2d    Dispense:  12 tablet    Refill:  0   cyclobenzaprine (FLEXERIL) 10 MG tablet    Sig: Take 0.5-1 tablets (5-10 mg total) by mouth at bedtime.    Dispense:  60 tablet    Refill:  0   methylPREDNISolone acetate (DEPO-MEDROL) injection 80 mg   Referral Orders         Ambulatory referral to Physical Therapy       Note is dictated utilizing voice recognition software. Although note has been proof read prior to signing, occasional typographical errors still can be missed. If any questions arise, please do not hesitate to call for verification.   electronically signed by:  Charlies Bellini, DO  [facility] [place]

## 2024-07-09 NOTE — Patient Instructions (Addendum)
 Return in about 4 weeks (around 08/06/2024), or if symptoms worsen or fail to improve.  Start prednisone tomorrow  Flexeril tonight Stretches Referred to Physical therapy.        Great to see you today.  I have refilled the medication(s) we provide.   If labs were collected or images ordered, we will inform you of  results once we have received them and reviewed. We will contact you either by echart message, or telephone call.  Please give ample time to the testing facility, and our office to run,  receive and review results. Please do not call inquiring of results, even if you can see them in your chart. We will contact you as soon as we are able. If it has been over 1 week since the test was completed, and you have not yet heard from us , then please call us .    - echart message- for normal results that have been seen by the patient already.   - telephone call: abnormal results or if patient has not viewed results in their echart.  If a referral to a specialist was entered for you, please call us  in 2 weeks if you have not heard from the specialist office to schedule.

## 2024-08-13 ENCOUNTER — Ambulatory Visit: Admitting: Family Medicine

## 2024-08-23 ENCOUNTER — Inpatient Hospital Stay: Payer: BC Managed Care – PPO | Attending: Hematology and Oncology | Admitting: Hematology and Oncology

## 2024-08-23 VITALS — BP 134/67 | HR 77 | Temp 98.0°F | Resp 18 | Ht 65.5 in | Wt 169.1 lb

## 2024-08-23 DIAGNOSIS — Z170 Estrogen receptor positive status [ER+]: Secondary | ICD-10-CM | POA: Insufficient documentation

## 2024-08-23 DIAGNOSIS — Z79811 Long term (current) use of aromatase inhibitors: Secondary | ICD-10-CM | POA: Insufficient documentation

## 2024-08-23 DIAGNOSIS — Z1721 Progesterone receptor positive status: Secondary | ICD-10-CM | POA: Insufficient documentation

## 2024-08-23 DIAGNOSIS — C50512 Malignant neoplasm of lower-outer quadrant of left female breast: Secondary | ICD-10-CM | POA: Insufficient documentation

## 2024-08-23 DIAGNOSIS — Z9221 Personal history of antineoplastic chemotherapy: Secondary | ICD-10-CM | POA: Diagnosis not present

## 2024-08-23 NOTE — Progress Notes (Signed)
 Patient Care Team: Catherine Charlies LABOR, DO as PCP - General (Family Medicine) Izell Domino, MD as Attending Physician (Radiation Oncology) Neysa Inocente PARAS, NP (Inactive) as Nurse Practitioner (Obstetrics and Gynecology) Odean Potts, MD as Consulting Physician (Hematology and Oncology) Bensimhon, Toribio SAUNDERS, MD as Consulting Physician (Cardiology) Crawford, Morna Pickle, NP as Nurse Practitioner (Hematology and Oncology) Ebbie Cough, MD as Consulting Physician (General Surgery) Odean Potts, MD as Consulting Physician (Hematology and Oncology) Chet Rush, MD as Referring Physician  DIAGNOSIS:  Encounter Diagnosis  Name Primary?   Malignant neoplasm of lower-outer quadrant of left breast of female, estrogen receptor positive (HCC) Yes    SUMMARY OF ONCOLOGIC HISTORY: Oncology History  Malignant neoplasm of lower-outer quadrant of left breast of female, estrogen receptor positive (HCC)  03/01/2017 Initial Diagnosis   Left breast 2:30 position: 2.1 cm irregular solid mass, additional 0.8 cm oval mass in the left breast (fibrocystic); primary mass on biopsy revealed IDC grade 3, ER 100%, PR 80%, Ki-67 25%, HER-2 positive ratio 1.88, copy #6.2, T2 N0 stage II a clinical stage   03/29/2017 - 06/14/2017 Neo-Adjuvant Chemotherapy   Neoadjuvant chemotherapy with Taxol  Herceptin  11 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  The Multi-Gene Panel offered by [facility] includes sequencing and/or deletion duplication testing of the following 80 genes: ALK, APC, ATM, AXIN2,BAP1,  BARD1, BLM, BMPR1A, BRCA1, BRCA2, BRIP1, CASR, CDC73, CDH1, CDK4, CDKN1B, CDKN1C, CDKN2A (p14ARF), CDKN2A (p16INK4a), CEBPA, CHEK2, CTNNA1, DICER1, DIS3L2, EGFR (c.2369C>T, p.Thr790Met variant only), EPCAM (Deletion/duplication testing only), FH, FLCN, GATA2, GPC3, GREM1 (Promoter region deletion/duplication testing only), HOXB13 (c.251G>A, p.Gly84Glu),  HRAS, KIT, MAX, MEN1, MET, MITF (c.952G>A, p.Glu318Lys variant only), MLH1, MSH2, MSH3, MSH6, MUTYH, NBN, NF1, NF2, NTHL1, PALB2, PDGFRA, PHOX2B, PMS2, POLD1, POLE, POT1, PRKAR1A, PTCH1, PTEN, RAD50, RAD51C, RAD51D, RB1, RECQL4, RET, RUNX1, SDHAF2, SDHA (sequence changes only), SDHB, SDHC, SDHD, SMAD4, SMARCA4, SMARCB1, SMARCE1, STK11, SUFU, TERT, TERT, TMEM127, TP53, TSC1, TSC2, VHL, WRN and WT1.  The report date is April 27, 2017.    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 Left breast, 2.67 Gy in 15 fractions for a total dose of 40.05 Gy  2. Boost, 2 Gy in 5 fractions for a total dose of 10 Gy      11/25/2017 -  Anti-estrogen oral therapy   Letrozole  daily     CHIEF COMPLIANT: Surveillance of breast cancer on letrozole  therapy  HISTORY OF PRESENT ILLNESS:   History of Present Illness Haley Sanchez is a 63 year old female who presents for a follow-up regarding her letrozole  treatment.  She has been on letrozole  since February 2019 and plans to complete the seven-year course by February 2026, with her current supply lasting until January 2026. A bone density scan in August was normal. Mammograms have been normal, with breast density classified as B. She experiences increased skin wrinkling, thinning, and dryness, which she attributes to estrogen reduction from letrozole .     ALLERGIES:  is allergic to no known allergies.  MEDICATIONS:  Current Outpatient Medications  Medication Sig Dispense Refill   albuterol  (PROVENTIL  HFA;VENTOLIN  HFA) 108 (90 Base) MCG/ACT inhaler Inhale 2 puffs into the lungs every 6 (six) hours as needed for wheezing or shortness of breath.  amLODipine  (NORVASC ) 10 MG tablet Take 1 tablet (10 mg total) by mouth daily. 90 tablet 1   buPROPion   (WELLBUTRIN  XL) 300 MG 24 hr tablet Take 1 tablet (300 mg total) by mouth daily. 90 tablet 1   cyclobenzaprine (FLEXERIL) 10 MG tablet Take 0.5-1 tablets (5-10 mg total) by mouth at bedtime. 60 tablet 0   FLUoxetine  (PROZAC ) 20 MG capsule Take 1 capsule (20 mg total) by mouth daily. 90 capsule 1   letrozole  (FEMARA ) 2.5 MG tablet TAKE 1 TABLET BY MOUTH EVERY DAY 90 tablet 3   losartan  (COZAAR ) 100 MG tablet Take 1 tablet (100 mg total) by mouth daily. 90 tablet 1   predniSONE (DELTASONE) 20 MG tablet 60 mg x1d, 40 mg x3d, 20 mg x2d, 10 mg x2d 12 tablet 0   No current facility-administered medications for this visit.    PHYSICAL EXAMINATION: ECOG PERFORMANCE STATUS: 1 - Symptomatic but completely ambulatory  Vitals:   08/23/24 1529 08/23/24 1530  BP: (!) 151/80 134/67  Pulse: 77   Resp: 18   Temp: 98 F (36.7 C)   SpO2: 97%    Filed Weights   08/23/24 1529  Weight: 169 lb 1.6 oz (76.7 kg)      LABORATORY DATA:  I have reviewed the data as listed    Latest Ref Rng & Units 04/25/2024    8:31 AM 06/28/2023    2:29 PM 07/30/2019    3:25 PM  CMP  Glucose 70 - 99 mg/dL 96  88  88   BUN 6 - 23 mg/dL 18  19  19    Creatinine 0.40 - 1.20 mg/dL 9.10  8.61  8.84   Sodium 135 - 145 mEq/L 144  139  142   Potassium 3.5 - 5.1 mEq/L 3.5  3.9  3.8   Chloride 96 - 112 mEq/L 107  101  107   CO2 19 - 32 mEq/L 31  30  23    Calcium 8.4 - 10.5 mg/dL 8.8  9.4  9.2   Total Protein 6.0 - 8.3 g/dL 5.9  6.5  6.8   Total Bilirubin 0.2 - 1.2 mg/dL 0.4  0.5  0.3   Alkaline Phos 39 - 117 U/L 66  72  85   AST 0 - 37 U/L 15  16  15    ALT 0 - 35 U/L 13  12  15      Lab Results  Component Value Date   WBC 3.4 (L) 04/25/2024   HGB 13.0 04/25/2024   HCT 38.6 04/25/2024   MCV 90.6 04/25/2024   PLT 222.0 04/25/2024   NEUTROABS 3.2 07/30/2019    ASSESSMENT & PLAN:  Malignant neoplasm of lower-outer quadrant of left breast of female, estrogen receptor positive (HCC) Left breast 2:30 position: 2.1 cm  irregular solid mass, additional 0.8 cm oval mass in the left breast (fibrocystic); primary mass on biopsy revealed IDC grade 3, ER 100%, PR 80%, Ki-67 25%, HER-2 positive ratio 1.88, copy #6.2, T2 N0 stage II a clinical stage   Treatment summary: 1. Neoadjuvant Taxol  Herceptin  weekly 11 completed 06/14/2017 followed by Herceptin  maintenance x 6 months completed 02/2018 2. breast conserving surgery with sentinel lymph node biopsy done on 07/21/2017 07/21/2017 Left lumpectomy: Grade 2 IDC 1.9 cm with DCIS, margins negative, 0/4 lymph nodes negative, ER 100%, PR 80%, HER-2 positive, Ki-67 25%, RCB class II,T1c N0 stage IA 3. Adjuvant radiation therapy finished on 10/03/17 4. Letrozole  started 11/25/17 (bone density 01/06/2018 was normal) -------------------------------------------------------------------------------------------------------------------------------------------------------------------------------------------------------------------  Letrozole   toxicities: Patient states hot all the time She will complete 7 years of therapy by February end of 2026.  She will stop letrozole  at that time. 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 Return to clinic on an as-needed basis      No orders of the defined types were placed in this encounter.  The patient has a good understanding of the overall plan. she agrees with it. she will call with any problems that may develop before the next visit here.  I personally spent a total of 30 minutes in the care of the patient today including preparing to see the patient, getting/reviewing separately obtained history, performing a medically appropriate exam/evaluation, counseling and educating, placing  orders, referring and communicating with other health care professionals, documenting clinical information in the EHR, independently interpreting results, communicating results, and coordinating care.   Viinay K Gotti Alwin, MD 08/23/24

## 2024-08-23 NOTE — Assessment & Plan Note (Signed)
 Left breast 2:30 position: 2.1 cm irregular solid mass, additional 0.8 cm oval mass in the left breast (fibrocystic); primary mass on biopsy revealed IDC grade 3, ER 100%, PR 80%, Ki-67 25%, HER-2 positive ratio 1.88, copy #6.2, T2 N0 stage II a clinical stage   Treatment summary: 1. Neoadjuvant Taxol  Herceptin  weekly 11 completed 06/14/2017 followed by Herceptin  maintenance x 6 months completed 02/2018 2. breast conserving surgery with sentinel lymph node biopsy done on 07/21/2017 07/21/2017 Left lumpectomy: Grade 2 IDC 1.9 cm with DCIS, margins negative, 0/4 lymph nodes negative, ER 100%, PR 80%, HER-2 positive, Ki-67 25%, RCB class II,T1c N0 stage IA 3. Adjuvant radiation therapy finished on 10/03/17 4. 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  Breast exam 08/23/2024: Benign 2.  Mammogram 05/18/2024: [facility] benign breast density category B  Return to clinic in 1 year for follow-up

## 2024-09-02 ENCOUNTER — Other Ambulatory Visit: Payer: Self-pay | Admitting: Family Medicine

## 2024-10-08 ENCOUNTER — Other Ambulatory Visit: Payer: Self-pay | Admitting: Family Medicine

## 2024-10-09 ENCOUNTER — Ambulatory Visit: Admitting: Family Medicine

## 2024-10-09 NOTE — Progress Notes (Deleted)
 "      Haley Sanchez, Haley Sanchez [birth year], 63 y.o., female MRN: [id number] Patient Care Team    Relationship Specialty Notifications Start End  Catherine Charlies LABOR, DO PCP - General Family Medicine  06/28/23   Izell Domino, MD Attending Physician Radiation Oncology  03/08/17   Neysa Inocente PARAS, NP (Inactive) Nurse Practitioner Obstetrics and Gynecology  03/09/17   Odean Potts, MD Consulting Physician Hematology and Oncology  11/22/18   Bensimhon, Toribio SAUNDERS, MD Consulting Physician Cardiology  11/22/18   Crawford Morna Pickle, NP Nurse Practitioner Hematology and Oncology  11/22/18   Ebbie Cough, MD Consulting Physician General Surgery  11/24/18   Odean Potts, MD Consulting Physician Hematology and Oncology  06/27/23   Chet Rush, MD Referring Physician   06/29/23     No chief complaint on file.    Subjective: Haley Sanchez is a 63 y.o. Pt presents for an OV with complaints of *** of *** duration.  Associated symptoms include ***.  Pt has tried *** to ease their symptoms.      07/09/2024    2:30 PM 04/25/2024    8:08 AM 06/28/2023    2:12 PM 11/09/2017   11:29 AM 08/05/2017    8:39 AM  Depression screen PHQ 2/9  Decreased Interest 1 1 1  0 0  Down, Depressed, Hopeless 1 1 2  0 0  PHQ - 2 Score 2 2 3  0 0  Altered sleeping 3 3 3     Tired, decreased energy 3 3     Change in appetite 0 0 2    Feeling bad or failure about yourself  1 1 0    Trouble concentrating 0 0 3    Moving slowly or fidgety/restless 0 1 0    Suicidal thoughts 0 0     PHQ-9 Score 9  10  11      Difficult doing work/chores Not difficult at all Not difficult at all Somewhat difficult       Data saved with a previous flowsheet row definition    Allergies[1] Social History   Social History Narrative   Marital status/children/pets: Married.   Education/employment: Masters degree.  Works as a Engineer, Agricultural:      -smoke alarm in the home:Yes     - wears seatbelt: Yes     - Feels safe in their relationships: No       Past Medical History:  Diagnosis Date   Anxiety    Asthma    Breast cancer (HCC)    Complication of anesthesia    Depression    Family history of breast cancer    Family history of prostate cancer    History of radiation therapy 08/29/2017- 10/03/17   Left Breast 40.05 Gy in 15 fractions followed by a boost of 10 Gy per fraction.    Hypertension    PONV (postoperative nausea and vomiting)    Port catheter REMOVED 04/05/2017   Seasonal allergies    Past Surgical History:  Procedure Laterality Date   BREAST LUMPECTOMY WITH RADIOACTIVE SEED AND SENTINEL LYMPH NODE BIOPSY Left 07/21/2017   Procedure: BREAST LUMPECTOMY WITH RADIOACTIVE SEED AND SENTINEL LYMPH NODE BIOPSY;  Surgeon: Ebbie Cough, MD;  Location: [facility];  Service: General;  Laterality: Left;   PORTACATH PLACEMENT Right 03/28/2017   Procedure: INSERTION PORT-A-CATH WITH US ;  Surgeon: Ebbie Cough, MD;  [facility];  Service: General;  Laterality: Right;   Family History  Problem Relation Age of Onset  Heart disease Mother    Hypertension Mother    Kidney failure Father    Diabetes Father    Hypertension Father    Prostate cancer Father 57   Brain cancer Maternal Grandmother 63   Prostate cancer Maternal Grandfather        dx in his 17s   Breast cancer Paternal Grandmother    Alcohol abuse Maternal Aunt    Other Maternal Uncle        died in MVA   Breast cancer Cousin        maternal first cousin dx in her 36s   Mental retardation Cousin    Breast cancer Other        maternal second cousin   Breast cancer Cousin        paternal first cousin   Breast cancer Cousin        paternal first cousin   Allergies as of 10/09/2024       Reactions   No Known Allergies         Medication List        Accurate as of October 09, 2024  9:44 AM. If you have any questions, ask your nurse or doctor.          albuterol  108 (90 Base) MCG/ACT inhaler Commonly known as:  VENTOLIN  HFA Inhale 2 puffs into the lungs every 6 (six) hours as needed for wheezing or shortness of breath.   amLODipine  10 MG tablet Commonly known as: NORVASC  Take 1 tablet (10 mg total) by mouth daily.   buPROPion  300 MG 24 hr tablet Commonly known as: WELLBUTRIN  XL Take 1 tablet (300 mg total) by mouth daily.   cyclobenzaprine  10 MG tablet Commonly known as: FLEXERIL  Take 0.5-1 tablets (5-10 mg total) by mouth at bedtime.   FLUoxetine  20 MG capsule Commonly known as: PROZAC  Take 1 capsule (20 mg total) by mouth daily.   letrozole  2.5 MG tablet Commonly known as: FEMARA  TAKE 1 TABLET BY MOUTH EVERY DAY   losartan  100 MG tablet Commonly known as: COZAAR  Take 1 tablet (100 mg total) by mouth daily.   predniSONE  20 MG tablet Commonly known as: DELTASONE  60 mg x1d, 40 mg x3d, 20 mg x2d, 10 mg x2d        All past medical history, surgical history, allergies, family history, immunizations andmedications were updated in the EMR today and reviewed under the history and medication portions of their EMR.     ROS Negative, with the exception of above mentioned in HPI   Objective:  There were no vitals taken for this visit. There is no height or weight on file to calculate BMI.  Physical Exam   No results found. No results found. No results found for this or any previous visit (from the past 24 hours).  Assessment/Plan: Haley Sanchez is a 63 y.o. female present for OV for  *** Reviewed expectations re: course of current medical issues. Discussed self-management of symptoms. Outlined signs and symptoms indicating need for more acute intervention. Patient verbalized understanding and all questions were answered. Patient received an After-Visit Summary.    No orders of the defined types were placed in this encounter.  No orders of the defined types were placed in this encounter.  Referral Orders  No referral(s) requested today     Note is dictated  utilizing voice recognition software. Although note has been proof read prior to signing, occasional typographical errors still can be missed. If any questions arise, please  do not hesitate to call for verification.   electronically signed by:  Charlies Bellini, DO  [facility] - [place]       [1]  Allergies Allergen Reactions   No Known Allergies    "

## 2024-10-10 ENCOUNTER — Ambulatory Visit: Admitting: Family Medicine

## 2024-10-12 ENCOUNTER — Encounter: Payer: Self-pay | Admitting: Family Medicine

## 2024-10-12 ENCOUNTER — Ambulatory Visit: Admitting: Family Medicine

## 2024-10-12 ENCOUNTER — Telehealth: Payer: Self-pay | Admitting: Family Medicine

## 2024-10-12 VITALS — BP 128/79 | HR 74 | Temp 98.5°F | Wt 166.2 lb

## 2024-10-12 DIAGNOSIS — B9689 Other specified bacterial agents as the cause of diseases classified elsewhere: Secondary | ICD-10-CM | POA: Diagnosis not present

## 2024-10-12 DIAGNOSIS — J4541 Moderate persistent asthma with (acute) exacerbation: Secondary | ICD-10-CM | POA: Insufficient documentation

## 2024-10-12 DIAGNOSIS — J988 Other specified respiratory disorders: Secondary | ICD-10-CM

## 2024-10-12 MED ORDER — BUDESONIDE-FORMOTEROL FUMARATE 160-4.5 MCG/ACT IN AERO: 2.0000 | INHALATION_SPRAY | Freq: Two times a day (BID) | RESPIRATORY_TRACT | 5 refills | Status: AC

## 2024-10-12 MED ORDER — HYDROCOD POLI-CHLORPHE POLI ER 10-8 MG/5ML PO SUER: 5.0000 mL | Freq: Every evening | ORAL | 0 refills | Status: AC | PRN

## 2024-10-12 MED ORDER — IPRATROPIUM BROMIDE 0.06 % NA SOLN: 2.0000 | Freq: Four times a day (QID) | NASAL | 12 refills | Status: AC

## 2024-10-12 MED ORDER — PREDNISONE 20 MG PO TABS: ORAL_TABLET | ORAL | 0 refills | Status: AC

## 2024-10-12 MED ORDER — DOXYCYCLINE HYCLATE 100 MG PO TABS: 100.0000 mg | ORAL_TABLET | Freq: Two times a day (BID) | ORAL | 0 refills | Status: AC

## 2024-10-12 MED ORDER — ALBUTEROL SULFATE HFA 108 (90 BASE) MCG/ACT IN AERS: 2.0000 | INHALATION_SPRAY | Freq: Four times a day (QID) | RESPIRATORY_TRACT | 5 refills | Status: AC | PRN

## 2024-10-12 NOTE — Patient Instructions (Addendum)

## 2024-10-12 NOTE — Progress Notes (Signed)
 "      Haley Sanchez, Haley Sanchez 1960-10-25, 64 y.o., female MRN: [id number] Patient Care Team    Relationship Specialty Notifications Start End  Catherine Charlies LABOR, DO PCP - General Family Medicine  06/28/23   Izell Domino, MD Attending Physician Radiation Oncology  03/08/17   Neysa Inocente PARAS, NP (Inactive) Nurse Practitioner Obstetrics and Gynecology  03/09/17   Odean Potts, MD Consulting Physician Hematology and Oncology  11/22/18   Bensimhon, Toribio SAUNDERS, MD Consulting Physician Cardiology  11/22/18   Crawford Morna Pickle, NP Nurse Practitioner Hematology and Oncology  11/22/18   Ebbie Cough, MD Consulting Physician General Surgery  11/24/18   Odean Potts, MD Consulting Physician Hematology and Oncology  06/27/23   Chet Rush, MD Referring Physician   06/29/23     Chief Complaint  Patient presents with   Cough    Over a week; Cough, congestion. Pt has tried Zyrtec, Mucinex.      Subjective: Haley Sanchez is a 64 y.o. Pt presents for an OV with complaints of cough of > 10 dyas duration.  Associated symptoms include Nasal and chest congestion . Pt has tried mucinex DM, zyrtec and albuterol  to ease their symptoms.  See ROS    07/09/2024    2:30 PM 04/25/2024    8:08 AM 06/28/2023    2:12 PM 11/09/2017   11:29 AM 08/05/2017    8:39 AM  Depression screen PHQ 2/9  Decreased Interest 1 1 1  0 0  Down, Depressed, Hopeless 1 1 2  0 0  PHQ - 2 Score 2 2 3  0 0  Altered sleeping 3 3 3     Tired, decreased energy 3 3     Change in appetite 0 0 2    Feeling bad or failure about yourself  1 1 0    Trouble concentrating 0 0 3    Moving slowly or fidgety/restless 0 1 0    Suicidal thoughts 0 0     PHQ-9 Score 9  10  11      Difficult doing work/chores Not difficult at all Not difficult at all Somewhat difficult       Data saved with a previous flowsheet row definition    Allergies[1] Social History   Social History Narrative   Marital status/children/pets: Married.   Education/employment:  Masters degree.  Works as a Engineer, Agricultural:      -smoke alarm in the home:Yes     - wears seatbelt: Yes     - Feels safe in their relationships: No      Past Medical History:  Diagnosis Date   Anxiety    Asthma    Breast cancer (HCC)    Complication of anesthesia    Depression    Family history of breast cancer    Family history of prostate cancer    History of radiation therapy 08/29/2017- 10/03/17   Left Breast 40.05 Gy in 15 fractions followed by a boost of 10 Gy per fraction.    Hypertension    PONV (postoperative nausea and vomiting)    Port catheter REMOVED 04/05/2017   Seasonal allergies    Past Surgical History:  Procedure Laterality Date   BREAST LUMPECTOMY WITH RADIOACTIVE SEED AND SENTINEL LYMPH NODE BIOPSY Left 07/21/2017   Procedure: BREAST LUMPECTOMY WITH RADIOACTIVE SEED AND SENTINEL LYMPH NODE BIOPSY;  Surgeon: Ebbie Cough, MD;  Location: [facility];  Service: General;  Laterality: Left;   PORTACATH PLACEMENT Right 03/28/2017   Procedure:  INSERTION PORT-A-CATH WITH US ;  Surgeon: Ebbie Cough, MD;  Location: [facility] OR;  Service: General;  Laterality: Right;   Family History  Problem Relation Age of Onset   Heart disease Mother    Hypertension Mother    Kidney failure Father    Diabetes Father    Hypertension Father    Prostate cancer Father 49   Brain cancer Maternal Grandmother 26   Prostate cancer Maternal Grandfather        dx in his 50s   Breast cancer Paternal Grandmother    Alcohol abuse Maternal Aunt    Other Maternal Uncle        died in MVA   Breast cancer Cousin        maternal first cousin dx in her 70s   Mental retardation Cousin    Breast cancer Other        maternal second cousin   Breast cancer Cousin        paternal first cousin   Breast cancer Cousin        paternal first cousin   Allergies as of 10/12/2024       Reactions   Cefdinir Diarrhea        Medication List        Accurate as of October 12, 2024 12:09 PM. If you have any questions, ask your nurse or doctor.          albuterol  108 (90 Base) MCG/ACT inhaler Commonly known as: VENTOLIN  HFA Inhale 2 puffs into the lungs every 6 (six) hours as needed for wheezing or shortness of breath.   amLODipine  10 MG tablet Commonly known as: NORVASC  Take 1 tablet (10 mg total) by mouth daily.   budesonide-formoterol 160-4.5 MCG/ACT inhaler Commonly known as: SYMBICORT Inhale 2 puffs into the lungs 2 (two) times daily. Started by: Charlies Bellini, DO   buPROPion  300 MG 24 hr tablet Commonly known as: WELLBUTRIN  XL Take 1 tablet (300 mg total) by mouth daily.   chlorpheniramine-HYDROcodone  10-8 MG/5ML Commonly known as: TUSSIONEX Take 5 mLs by mouth at bedtime as needed for cough. Started by: Charlies Bellini, DO   cyclobenzaprine  10 MG tablet Commonly known as: FLEXERIL  Take 0.5-1 tablets (5-10 mg total) by mouth at bedtime.   doxycycline 100 MG tablet Commonly known as: VIBRA-TABS Take 1 tablet (100 mg total) by mouth 2 (two) times daily. Started by: Charlies Bellini, DO   FLUoxetine  20 MG capsule Commonly known as: PROZAC  Take 1 capsule (20 mg total) by mouth daily.   ipratropium 0.06 % nasal spray Commonly known as: ATROVENT Place 2 sprays into both nostrils 4 (four) times daily. Started by: Charlies Bellini, DO   letrozole  2.5 MG tablet Commonly known as: FEMARA  TAKE 1 TABLET BY MOUTH EVERY DAY   losartan  100 MG tablet Commonly known as: COZAAR  Take 1 tablet (100 mg total) by mouth daily.   predniSONE  20 MG tablet Commonly known as: DELTASONE  60 mg x3d, 40 mg x3d, 20 mg x2d, 10 mg x2d What changed: additional instructions Changed by: Charlies Bellini, DO        All past medical history, surgical history, allergies, family history, immunizations andmedications were updated in the EMR today and reviewed under the history and medication portions of their EMR.     Review of Systems  Constitutional:  Positive for  malaise/fatigue. Negative for chills and fever.  HENT:  Positive for congestion and sinus pain. Negative for ear pain.   Eyes: Negative.   Respiratory:  Positive for cough, sputum production and wheezing. Negative for shortness of breath.   Cardiovascular: Negative.   Gastrointestinal: Negative.   Genitourinary: Negative.   Musculoskeletal: Negative.   Skin:  Negative for rash.  Neurological:  Positive for headaches. Negative for dizziness.  Endo/Heme/Allergies: Negative.   Psychiatric/Behavioral: Negative.     Negative, with the exception of above mentioned in HPI   Objective:  BP 128/79   Pulse 74   Temp 98.5 F (36.9 C)   Wt 166 lb 3.2 oz (75.4 kg)   SpO2 97%   BMI 27.24 kg/m  Body mass index is 27.24 kg/m. Physical Exam Vitals and nursing note reviewed.  Constitutional:      General: She is not in acute distress.    Appearance: Normal appearance. She is normal weight. She is ill-appearing. She is not toxic-appearing.  HENT:     Head: Normocephalic and atraumatic.     Right Ear: Tympanic membrane, ear canal and external ear normal.     Left Ear: Tympanic membrane, ear canal and external ear normal.     Nose: Congestion and rhinorrhea present.     Comments: PND    Mouth/Throat:     Mouth: Mucous membranes are moist.     Pharynx: Posterior oropharyngeal erythema present. No oropharyngeal exudate.  Eyes:     General: No scleral icterus.       Right eye: No discharge.        Left eye: No discharge.     Extraocular Movements: Extraocular movements intact.     Conjunctiva/sclera: Conjunctivae normal.     Pupils: Pupils are equal, round, and reactive to light.  Cardiovascular:     Rate and Rhythm: Normal rate and regular rhythm.  Pulmonary:     Effort: Pulmonary effort is normal. No respiratory distress.     Breath sounds: Rhonchi present. No wheezing or rales.  Musculoskeletal:     Cervical back: Neck supple. No tenderness.  Lymphadenopathy:     Cervical: No  cervical adenopathy.  Skin:    Findings: No rash.  Neurological:     Mental Status: She is alert and oriented to person, place, and time. Mental status is at baseline.     Motor: No weakness.     Coordination: Coordination normal.     Gait: Gait normal.  Psychiatric:        Mood and Affect: Mood normal.        Behavior: Behavior normal.        Thought Content: Thought content normal.        Judgment: Judgment normal.      No results found. No results found. No results found for this or any previous visit (from the past 24 hours).  Assessment/Plan: Haley Sanchez is a 64 y.o. female present for OV for  Bacterial respiratory infection-with moderate persistent asthma Rest.  Hydrate. Nasal saline flushes twice daily followed by Flonase nasal spray. Atrovent nasal spray prescribed 4-postnasal drip Refilled albuterol  and Symbicort for her. Prednisone  taper prescribed Doxycycline twice daily prescribed Tussionex for cough Follow-up in 2 weeks if symptoms are not resolved, sooner if needed  Reviewed expectations re: course of current medical issues. Discussed self-management of symptoms. Outlined signs and symptoms indicating need for more acute intervention. Patient verbalized understanding and all questions were answered. Patient received an After-Visit Summary.    No orders of the defined types were placed in this encounter.  Meds ordered this encounter  Medications   ipratropium (ATROVENT) 0.06 % nasal  spray    Sig: Place 2 sprays into both nostrils 4 (four) times daily.    Dispense:  15 mL    Refill:  12   albuterol  (VENTOLIN  HFA) 108 (90 Base) MCG/ACT inhaler    Sig: Inhale 2 puffs into the lungs every 6 (six) hours as needed for wheezing or shortness of breath.    Dispense:  18 g    Refill:  5   budesonide-formoterol (SYMBICORT) 160-4.5 MCG/ACT inhaler    Sig: Inhale 2 puffs into the lungs 2 (two) times daily.    Dispense:  1 each    Refill:  5   predniSONE   (DELTASONE ) 20 MG tablet    Sig: 60 mg x3d, 40 mg x3d, 20 mg x2d, 10 mg x2d    Dispense:  18 tablet    Refill:  0   doxycycline (VIBRA-TABS) 100 MG tablet    Sig: Take 1 tablet (100 mg total) by mouth 2 (two) times daily.    Dispense:  20 tablet    Refill:  0   chlorpheniramine-HYDROcodone  (TUSSIONEX) 10-8 MG/5ML    Sig: Take 5 mLs by mouth at bedtime as needed for cough.    Dispense:  70 mL    Refill:  0   Referral Orders  No referral(s) requested today     Note is dictated utilizing voice recognition software. Although note has been proof read prior to signing, occasional typographical errors still can be missed. If any questions arise, please do not hesitate to call for verification.   electronically signed by:  Charlies Bellini, DO  [facility] - [place]       [1]  Allergies Allergen Reactions   Cefdinir Diarrhea   "

## 2024-10-12 NOTE — Telephone Encounter (Signed)
 LVM to discuss. Sent MyChart.

## 2024-10-12 NOTE — Telephone Encounter (Signed)
 As I told patient during her visit, this particular medicine requires my fingerprint on a different computer.  Therefore medicines that require at least get called in around lunch and/or after hours.  I have now called in the medication for her

## 2024-10-12 NOTE — Telephone Encounter (Signed)
 Copied from CRM [id number]. Topic: Clinical - Medication Question >> Oct 12, 2024 11:22 AM Maisie BROCKS wrote: Reason for CRM: pt called and stated that Dr. Catherine was going to prescribe her chlorpheniramine-HYDROcodone  (TUSSIONEX) for her cough after her visit today but when she called in her other 5 medicines she didn't call the tussionex in. She is asking for a nurse to follow-up with her on the status. Please call and advise.

## 2024-10-31 ENCOUNTER — Encounter: Payer: Self-pay | Admitting: Family Medicine
